# Patient Record
Sex: Male | Born: 1955 | Race: Black or African American | Hispanic: No | Smoking: Current every day smoker
Health system: Southern US, Community
[De-identification: ages and names within clinical notes are randomized; demographics above are authoritative.]

## PROBLEM LIST (undated history)

## (undated) DIAGNOSIS — I82409 Acute embolism and thrombosis of unspecified deep veins of unspecified lower extremity: Secondary | ICD-10-CM

## (undated) DIAGNOSIS — C801 Malignant (primary) neoplasm, unspecified: Secondary | ICD-10-CM

## (undated) DIAGNOSIS — I219 Acute myocardial infarction, unspecified: Secondary | ICD-10-CM

## (undated) DIAGNOSIS — E785 Hyperlipidemia, unspecified: Secondary | ICD-10-CM

## (undated) DIAGNOSIS — E119 Type 2 diabetes mellitus without complications: Secondary | ICD-10-CM

## (undated) DIAGNOSIS — C3491 Malignant neoplasm of unspecified part of right bronchus or lung: Principal | ICD-10-CM

## (undated) DIAGNOSIS — R202 Paresthesia of skin: Secondary | ICD-10-CM

## (undated) DIAGNOSIS — R531 Weakness: Secondary | ICD-10-CM

## (undated) DIAGNOSIS — C189 Malignant neoplasm of colon, unspecified: Secondary | ICD-10-CM

## (undated) DIAGNOSIS — M1990 Unspecified osteoarthritis, unspecified site: Secondary | ICD-10-CM

## (undated) DIAGNOSIS — I10 Essential (primary) hypertension: Secondary | ICD-10-CM

## (undated) DIAGNOSIS — C61 Malignant neoplasm of prostate: Secondary | ICD-10-CM

## (undated) DIAGNOSIS — R351 Nocturia: Secondary | ICD-10-CM

## (undated) DIAGNOSIS — E039 Hypothyroidism, unspecified: Secondary | ICD-10-CM

## (undated) DIAGNOSIS — R569 Unspecified convulsions: Secondary | ICD-10-CM

## (undated) DIAGNOSIS — I2510 Atherosclerotic heart disease of native coronary artery without angina pectoris: Secondary | ICD-10-CM

## (undated) DIAGNOSIS — R0600 Dyspnea, unspecified: Secondary | ICD-10-CM

## (undated) DIAGNOSIS — C419 Malignant neoplasm of bone and articular cartilage, unspecified: Secondary | ICD-10-CM

## (undated) HISTORY — PX: HEMICOLECTOMY: SHX854

## (undated) HISTORY — PX: OTHER SURGICAL HISTORY: SHX169

## (undated) HISTORY — DX: Atherosclerotic heart disease of native coronary artery without angina pectoris: I25.10

## (undated) HISTORY — DX: Hyperlipidemia, unspecified: E78.5

## (undated) HISTORY — DX: Malignant neoplasm of colon, unspecified: C18.9

## (undated) HISTORY — DX: Essential (primary) hypertension: I10

## (undated) HISTORY — DX: Malignant neoplasm of unspecified part of right bronchus or lung: C34.91

---

## 2001-03-30 HISTORY — PX: LEG SURGERY: SHX1003

## 2001-03-30 HISTORY — PX: OTHER SURGICAL HISTORY: SHX169

## 2005-03-30 DIAGNOSIS — I219 Acute myocardial infarction, unspecified: Secondary | ICD-10-CM

## 2005-03-30 HISTORY — PX: CORONARY ANGIOPLASTY: SHX604

## 2005-03-30 HISTORY — DX: Acute myocardial infarction, unspecified: I21.9

## 2005-03-30 HISTORY — PX: OTHER SURGICAL HISTORY: SHX169

## 2005-06-14 ENCOUNTER — Ambulatory Visit: Payer: Self-pay | Admitting: Cardiology

## 2005-06-14 ENCOUNTER — Inpatient Hospital Stay (HOSPITAL_COMMUNITY): Admission: AD | Admit: 2005-06-14 | Discharge: 2005-06-16 | Payer: Self-pay | Admitting: Internal Medicine

## 2005-06-30 ENCOUNTER — Ambulatory Visit: Payer: Self-pay | Admitting: Cardiology

## 2006-07-01 ENCOUNTER — Ambulatory Visit: Payer: Self-pay | Admitting: Physician Assistant

## 2006-07-16 ENCOUNTER — Ambulatory Visit: Payer: Self-pay | Admitting: Internal Medicine

## 2006-07-26 ENCOUNTER — Ambulatory Visit (HOSPITAL_COMMUNITY): Admission: RE | Admit: 2006-07-26 | Discharge: 2006-07-26 | Payer: Self-pay | Admitting: Internal Medicine

## 2006-07-26 ENCOUNTER — Ambulatory Visit: Payer: Self-pay | Admitting: Internal Medicine

## 2006-07-26 ENCOUNTER — Encounter (INDEPENDENT_AMBULATORY_CARE_PROVIDER_SITE_OTHER): Payer: Self-pay | Admitting: Specialist

## 2006-07-30 ENCOUNTER — Ambulatory Visit (HOSPITAL_COMMUNITY): Admission: RE | Admit: 2006-07-30 | Discharge: 2006-07-30 | Payer: Self-pay | Admitting: Internal Medicine

## 2006-08-24 ENCOUNTER — Encounter (INDEPENDENT_AMBULATORY_CARE_PROVIDER_SITE_OTHER): Payer: Self-pay | Admitting: General Surgery

## 2006-08-24 ENCOUNTER — Inpatient Hospital Stay (HOSPITAL_COMMUNITY): Admission: RE | Admit: 2006-08-24 | Discharge: 2006-08-31 | Payer: Self-pay | Admitting: General Surgery

## 2006-09-22 ENCOUNTER — Ambulatory Visit (HOSPITAL_COMMUNITY): Admission: RE | Admit: 2006-09-22 | Discharge: 2006-09-22 | Payer: Self-pay | Admitting: Internal Medicine

## 2006-11-08 ENCOUNTER — Encounter: Payer: Self-pay | Admitting: Cardiology

## 2006-11-08 ENCOUNTER — Ambulatory Visit: Payer: Self-pay | Admitting: Physician Assistant

## 2007-04-04 ENCOUNTER — Encounter: Payer: Self-pay | Admitting: Cardiology

## 2007-05-02 ENCOUNTER — Ambulatory Visit (HOSPITAL_COMMUNITY): Admission: RE | Admit: 2007-05-02 | Discharge: 2007-05-02 | Payer: Self-pay | Admitting: Internal Medicine

## 2007-05-04 ENCOUNTER — Encounter: Payer: Self-pay | Admitting: Cardiology

## 2007-07-12 ENCOUNTER — Ambulatory Visit: Payer: Self-pay | Admitting: Cardiology

## 2007-07-21 ENCOUNTER — Encounter: Payer: Self-pay | Admitting: Physician Assistant

## 2007-08-31 ENCOUNTER — Encounter: Payer: Self-pay | Admitting: Cardiology

## 2007-09-06 ENCOUNTER — Ambulatory Visit: Payer: Self-pay | Admitting: Internal Medicine

## 2007-09-06 ENCOUNTER — Ambulatory Visit (HOSPITAL_COMMUNITY): Admission: RE | Admit: 2007-09-06 | Discharge: 2007-09-06 | Payer: Self-pay | Admitting: Internal Medicine

## 2008-01-10 ENCOUNTER — Encounter: Payer: Self-pay | Admitting: Cardiology

## 2008-03-19 ENCOUNTER — Encounter: Payer: Self-pay | Admitting: Cardiology

## 2008-03-20 ENCOUNTER — Encounter: Payer: Self-pay | Admitting: Cardiology

## 2008-04-17 ENCOUNTER — Ambulatory Visit: Payer: Self-pay | Admitting: Cardiology

## 2008-11-21 DIAGNOSIS — I10 Essential (primary) hypertension: Secondary | ICD-10-CM

## 2008-11-21 DIAGNOSIS — I2510 Atherosclerotic heart disease of native coronary artery without angina pectoris: Secondary | ICD-10-CM

## 2008-11-21 DIAGNOSIS — E785 Hyperlipidemia, unspecified: Secondary | ICD-10-CM | POA: Insufficient documentation

## 2008-11-22 ENCOUNTER — Encounter (INDEPENDENT_AMBULATORY_CARE_PROVIDER_SITE_OTHER): Payer: Self-pay | Admitting: *Deleted

## 2009-02-27 ENCOUNTER — Encounter: Payer: Self-pay | Admitting: Cardiology

## 2009-03-30 DIAGNOSIS — C801 Malignant (primary) neoplasm, unspecified: Secondary | ICD-10-CM

## 2009-03-30 HISTORY — DX: Malignant (primary) neoplasm, unspecified: C80.1

## 2009-05-01 ENCOUNTER — Encounter: Payer: Self-pay | Admitting: Cardiology

## 2009-05-15 ENCOUNTER — Ambulatory Visit: Payer: Self-pay | Admitting: Cardiology

## 2009-05-15 DIAGNOSIS — C189 Malignant neoplasm of colon, unspecified: Secondary | ICD-10-CM

## 2009-10-29 ENCOUNTER — Encounter: Payer: Self-pay | Admitting: Cardiology

## 2009-10-29 ENCOUNTER — Encounter (INDEPENDENT_AMBULATORY_CARE_PROVIDER_SITE_OTHER): Payer: Self-pay | Admitting: *Deleted

## 2009-11-12 ENCOUNTER — Ambulatory Visit: Payer: Self-pay | Admitting: Cardiology

## 2009-11-20 ENCOUNTER — Encounter (INDEPENDENT_AMBULATORY_CARE_PROVIDER_SITE_OTHER): Payer: Self-pay | Admitting: *Deleted

## 2010-01-21 ENCOUNTER — Ambulatory Visit: Payer: Self-pay | Admitting: Cardiology

## 2010-01-29 ENCOUNTER — Encounter (INDEPENDENT_AMBULATORY_CARE_PROVIDER_SITE_OTHER): Payer: Self-pay | Admitting: *Deleted

## 2010-03-12 ENCOUNTER — Encounter: Payer: Self-pay | Admitting: Internal Medicine
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
## 2010-04-29 NOTE — Letter (Signed)
Summary: Engineer, materials at [facility]  [address]   [place], [place] [address]   Phone: [phone]  Fax: [phone]        November 20, 2009 MRN: [id number]   [name] [address] [place], [place]  [address]   Dear Mr. CU,  Your test ordered by Selena Batten has been reviewed by your physician (or physician assistant) and was found to be normal or stable. Your physician (or physician assistant) felt no changes were needed at this time.  ____ Echocardiogram  __X_ Cardiac Stress Test  ____ Lab Work  ____ Peripheral vascular study of arms, legs or neck  ____ CT scan or X-ray  ____ Lung or Breathing test  ____ Other:   Thank you.   Hoover Brunette, LPN    Duane Boston, M.D., F.A.C.C. Thressa Sheller, M.D., F.A.C.C. Oneal Grout, M.D., F.A.C.C. Cheree Ditto, M.D., F.A.C.C. Daiva Nakayama, M.D., F.A.C.C. Kenney Houseman, M.D., F.A.C.C. Jeanne Ivan, PA-C

## 2010-04-29 NOTE — Miscellaneous (Signed)
Summary: Orders Update  Clinical Lists Changes  Orders: Added new Referral order of Nuclear Med (Nuc Med) - Signed 
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
## 2010-04-29 NOTE — Letter (Signed)
Summary: Lexiscan or Dobutamine Pharmacist, community at [facility]  [address]   [place], [place] [address]   Phone: [phone]  Fax: [phone]      [facility] Cardiovascular Services  Lexiscan or Dobutamine Cardiolite Strss Test    [facility]  Appointment Date:_  Appointment Time:_  Your doctor has ordered a CARDIOLITE STRESS TEST using a medication to stimulate exercise so that you will not have to walk on the treadmill to determine the condition of your heart during stress. If you take blood pressure medication, ask your doctor if you should take it the day of your test. You should not have anything to eat or drink at least 4 hours before your test is scheduled, and no caffeine, including decaffeinated tea and coffee, chocolate, and soft drinks for 24 hours before your test.  You will need to register at the Outpatient/Main Entrance at the hospital 15 minutes before your appointment time. It is a good idea to bring a copy of your order with you. They will direct you to the Diagnostic Imaging (Radiology) Department.  You will be asked to undress from the waist up and given a hospital gown to wear, so dress comfortably from the waist down for example: Sweat pants, shorts, or skirt Rubber soled lace up shoes (tennis shoes)  Plan on about three hours from registration to release from the hospital

## 2010-04-29 NOTE — Letter (Signed)
Summary: Engineer, materials at [facility]  [address]   [place], [place] [address]   Phone: [phone]  Fax: [phone]        January 29, 2010 MRN: [id number]   Cameron Chandler [address] [place], [place]  [address]   Dear [name],  Your test ordered by Selena Batten has been reviewed by your physician (or physician assistant) and was found to be normal or stable. Your physician (or physician assistant) felt no changes were needed at this time.  ____ Echocardiogram  ____ Cardiac Stress Test  __X__ Lab Work - tumor marker for colon cancer was negative  ____ Peripheral vascular study of arms, legs or neck  ____ CT scan or X-ray  ____ Lung or Breathing test  __X__ Other:  I was unable to reach you by the numbers we had available in your chart.    [phone] - out of service [phone] - had message that mailbox had not been set up yet   Thank you.   Hoover Brunette, LPN    Duane Boston, M.D., F.A.C.C. Thressa Sheller, M.D., F.A.C.C. Oneal Grout, M.D., F.A.C.C. Cheree Ditto, M.D., F.A.C.C. Daiva Nakayama, M.D., F.A.C.C. Kenney Houseman, M.D., F.A.C.C. Jeanne Ivan, PA-C

## 2010-05-01 NOTE — Letter (Signed)
Summary: CLINIC NOTE FROM DR DAROVSKY  CLINIC NOTE FROM DR DAROVSKY   Imported By: Rexene Alberts 03/12/2010 15:12:02  _____________________________________________________________________  External Attachment:    Type:   Image     Comment:   External Document

## 2010-08-01 ENCOUNTER — Other Ambulatory Visit: Payer: Self-pay | Admitting: *Deleted

## 2010-08-01 MED ORDER — LISINOPRIL 20 MG PO TABS: 20.0000 mg | ORAL_TABLET | Freq: Every day | ORAL | Status: DC

## 2010-08-01 MED ORDER — SIMVASTATIN 40 MG PO TABS: 40.0000 mg | ORAL_TABLET | Freq: Every evening | ORAL | Status: DC

## 2010-08-12 NOTE — Op Note (Signed)
NAMEKADE, DEMICCO             ACCOUNT NO.:  [id number]   MEDICAL RECORD NO.:  [id number]          PATIENT TYPE:  INP   LOCATION:  2899                         FACILITY:  [facility]   PHYSICIAN:  Boston Service, M.D.DATE OF BIRTH:  1955-10-11   DATE OF PROCEDURE:  08/24/2006  DATE OF DISCHARGE:                               OPERATIVE REPORT   GASTROENTEROLOGY:  Roetta Sessions, MD, [facility].   GENERAL SURGERY:  Angelia Mould. Derrell Lolling, M.D., [facility].   UROLOGY:  Boston Service, M.D., [facility].   PREOPERATIVE DIAGNOSIS:  A 54 year old male, cancer of the proximal  rectum.  Careful gastrointestinal evaluation with Dr. Jena Gauss.  Planned  surgery with Dr. Derrell Lolling.  Felt that ureteral stents prior to surgery  would be helpful in identifying the path of the ureters.  Happy to  provide that service.   POSTOPERATIVE DIAGNOSIS:  A 55 year old male, cancer of the proximal  rectum.  Careful gastrointestinal evaluation with Dr. Jena Gauss.  Planned  surgery with Dr. Derrell Lolling.  Felt that ureteral stents prior to surgery  would be helpful in identifying the path of the ureters.  Happy to  provide that service.   PROCEDURE:  1. Cystoscopy.  2. Retrogrades.  3. Right and left ureteral stent placement, 6-French x2.   ANESTHESIA:  General.   DESCRIPTION OF PROCEDURE:  Patient was prepped and draped in the  dorsolithotomy position after institution of an adequate level of  general anesthesia.  A well-lubricated 22-French panendoscope was gently  inserted at the urethral meatus, normal urethra and sphincter,  nonobstructive prostate.  Bladder was carefully inspected with the 12  and 70-degree lenses and showed no obvious evidence of intravesical  pathology.  Blocking catheter was positioned at the right orifice and  then at the left orifice.   Retrograde films were then obtained.  With gentle injection of 3-6 mL of  contrast, ureter, pelvis, and calyces were easily outlined.  Floppy-tip  guidewire was advanced into the upper pole calyces.  A 6-French end-hole  catheter was then advanced over the guidewire to a distance of about 20  cm.  Cystoscope was then gently withdrawn.  An 18-French Foley was  advanced into the bladder with immediate return of several hundred  milliliters of clear irrigant.  Ureteral catheters were then tied to the  Foley catheter, will remain in place during the procedure, to be removed  by Dr. Derrell Lolling at the end of the procedure.          ______________________________  Boston Service, M.D.    RH/MEDQ  D:  08/24/2006  T:  08/24/2006  Job:  [id number]
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
## 2010-08-12 NOTE — Op Note (Signed)
NAME:  Cameron Chandler, Cameron Chandler             ACCOUNT NO.:  [id number]   MEDICAL RECORD NO.:  [id number]          PATIENT TYPE:  AMB   LOCATION:  DAY                           FACILITY:  [facility]   PHYSICIAN:  R. Roetta Sessions, M.D. DATE OF BIRTH:  Feb 16, 1956   DATE OF PROCEDURE:  09/06/2007  DATE OF DISCHARGE:                               OPERATIVE REPORT   INDICATIONS FOR PROCEDURE:  A 55 year old gentleman was found have  rectal cancer and colonic adenomas.  A colonoscopy 1 year ago.  He is  status post low anterior resection by Dr. Claud Kelp.  He has been  followed by Dr. Earma Reading at [facility].  He has  received chemotherapy.  He is being followed closely.  He reports being  told he is doing very well at followup.  He has no lower GI tract  symptoms.  He is here for his 1 year surveillance, although he presents  a little late.  This approach has discussed with Mr. Casebeer.  Risks,  benefits, alternatives, and limitations have been reviewed, questions  answered, all parties agreeable.  All questions answered.   PROCEDURE NOTE:  O2 saturation, blood pressure, and pulse of the patient  monitored throughout the entire procedure.  Conscious sedation, Versed 3  mg IV, Demerol 75 mg IV in divided doses.   INSTRUMENT:  Pentax video chip system.   FINDINGS:  Digital rectal exam revealed no abnormalities.  Endoscopic  findings, prep was good.  The surgical anastomosis was identified at 10-  12 cm from the anal verge.  It looked good.  Examination of the residual  colonic colon was undertaken.  The scope was easily advanced through the  residual colon to the cecum.  Appendix and ileocecal valve were well  seen and photographed for the record.  Terminal ileum was intubated 10  cm from this level.  Scope was slowly cautiously withdrawn.  All  previous mentioned mucosal surfaces were again seen.  The patient had a  few scattered distal colonic diverticula.  The remainder of  colonic  mucosa appeared entirely normal.  The scope was pulled down into the  rectum where the anastomosis was again looked at further and rectum was  then carefully examined including retroflexed view of the distal rectum  revealed no mucosal abnormalities.  The patient tolerated the procedure  well and was reacted in Endoscopy.   IMPRESSION:  1. Surgical anastomosis 10-12 cm.  Residual rectum appeared normal.      Distal colonic diverticular and residual colonic mucosa appeared      normal.  Normal terminal ileum.  Diverticulosis literature provided      to Mr. Musselman.  2. Repeat surveillance colonoscopy in 3 years.      Jonathon Bellows, M.D.  Electronically Signed     RMR/MEDQ  D:  09/06/2007  T:  09/07/2007  Job:  [id number]   cc:   Lebron Conners. Darovsky, M.D.  Fax: [phone]   Learta Codding, MD,FACC  [address]  [place], [place] [address]   Angelia Mould. Derrell Lolling, M.D.  [address], [address]  [place]  [place] [address]   [employer] [facility], M.D.  Fax: [phone]   Kirstie Peri, MD  Fax: [phone]

## 2010-08-12 NOTE — Assessment & Plan Note (Signed)
[facility]                          [facility] NOTE   TAREK, CRAVENS                    MRN:          [id number]  DATE:07/12/2007                            DOB:          09/17/1955    PHYSICIANS:  Cardiologist Dr. Lewayne Bunting, oncologist Dr. Earma Reading.   HISTORY OF PRESENT ILLNESS:  Mr. Cameron Chandler is a 55 year old male patient  with a history of coronary artery disease and stage IV colon cancer who  presents to the office today for follow-up.  Overall he is doing well.  He has completed chemotherapy with FOLFOX 6 and Avastin.  He has to wait  another four months before he gets his Port-A-Cath out.  He denies any  chest pain, shortness of breath, syncope, near syncope, orthopnea, PND  or pedal edema.   MEDICATIONS:  1. Aspirin 325 mg daily.  2. Simvastatin 40 mg q.h.s.  3. Metoprolol 25 mg b.i.d.  4. Lisinopril 20 mg a day.  5. Nitroglycerin for p.r.n. chest pain.   ALLERGIES:  No known drug allergies.   PHYSICAL EXAMINATION:  GENERAL:  He is a well-nourished, well-developed  man.  VITAL SIGNS:  Blood pressure 112/70, pulse 62, respirations 16, weight  202 pounds.  HEENT:  Normal.  NECK:  Without JVD.  Carotids without bruits bilaterally.  CARDIAC:  Normal S1, S2, regular rate and rhythm.  LUNGS:  Clear to auscultation bilaterally.  ABDOMEN:  Nontender.  EXTREMITIES:  Without edema.  NEUROLOGIC:  He is alert and oriented x3.  Cranial nerves II-XII are  grossly intact.   IMPRESSION:  1. Coronary artery disease.      a.     Status post non-ST elevation myocardial function March 2007       treated with a bare metal stent of the proximal circumflex.  2. Good left ventricular function.  3. Sigmoid adenocarcinoma clinical stage 4.      a.     Chemotherapy with FOLFOX 6 and Avastin completed two months       ago.      b.     Followed by Dr. Ubaldo Glassing.  4. Hypertension controlled.  5. Treated dyslipidemia.  6.  Hyperglycemia.      a.     Lab work done in October 2009 revealing a glucose of 153.  7. Gastroesophageal reflux disease.  8. Tobacco abuse.   PLAN:  Mr. Casher presents to the office today for follow-up.  From a  cardiovascular standpoint he is doing well.  No medication changes will  be made today.  He needs follow-up on his lipids.  Will set him up for a  fasting CMET and lipid panel as well as a hemoglobin A1c to further  assess his hyperglycemia.  He does not have a primary care physician,  and we will refer him to one for follow up on his hyperglycemia and  possible diabetes mellitus.  We will see him back in six months for  routine follow-up or sooner p.r.n.  Of note, the components of FOLFOX  and the agent, avastin, do not appear to be cardiotoxic.  Tereso Newcomer, PA-C  Electronically Signed      Learta Codding, MD,FACC  Electronically Signed   SW/MedQ  DD: 07/12/2007  DT: 07/12/2007  Job #: [id number]   cc:   Lebron Conners. Darovsky, M.D.

## 2010-08-12 NOTE — Assessment & Plan Note (Signed)
[facility]                          [facility]   Cameron Chandler, Cameron Chandler                    MRN:          [id number]  DATE:04/17/2008                            DOB:          09-09-1955    REFERRING PHYSICIAN:  Boris M. Darovsky, M.D.   HISTORY OF PRESENT ILLNESS:  The patient is a 55 year old male with a  history of coronary artery disease with stage IV colon cancer.  He has  completed chemotherapy.  He is doing quite well.  He still has a Port-A-  Cath in place.  He also has a history of coronary artery disease and is  status post bare-metal stent for a non-ST elevation myocardial  infarction in 2007.  However, the patient is doing quite well.  He  reports no chest pressure, shortness of breath, orthopnea or PND.   MEDICATIONS:  1. Aspirin 325 daily.  2. Simvastatin 40 mg p.o. nightly.  3. Metoprolol 25 mg p.o. b.i.d.  4. Lisinopril 10 mg p.o. daily.   PHYSICAL EXAMINATION:  VITAL SIGNS:  Blood pressure 110/72, heart rate  60, and weight is 225 pounds.  NECK:  Normal carotid upstroke.  No carotid bruits.  LUNGS:  Clear breath sounds bilaterally.  HEART:  Regular rate and rhythm.  Normal sinus rhythm.  No murmurs,  rubs, or gallops.  ABDOMEN:  Soft and nontender.  No rebound or guarding.  Good bowel  sounds.  EXTREMITIES:  No cyanosis, clubbing, or edema.   PROBLEM LIST:  1. Coronary artery disease.      a.     Status post non-ST elevation myocardial infarction in March       2007 treated with a bare-metal stent to the proximal circumflex.      b.     No recurrent angina.  2. Good left ventricular function.  3. Sigmoid adenocarcinoma, clinical stage IV status post FOLFOX6      therapy and Avastin.  4. Hypertension.  5. Treated dyslipidemia.  6. Hyperglycemia.  7. Gastroesophageal reflux disease.  8. Tobacco use.   PLAN:  From cardiovascular standpoint, the patient is stable.  We  reviewed his EKG, there are no acute  ischemic changes.  The patient can  continue his medical therapy, but I did advise him to discontinue  tobacco.     Learta Codding, MD,FACC  Electronically Signed    GED/MedQ  DD: 04/17/2008  DT: 04/18/2008  Job #: [id number]   cc:   Lebron Conners. Darovsky, M.D.

## 2010-08-12 NOTE — Op Note (Signed)
NAMESYNCERE, EBLE             ACCOUNT NO.:  [id number]   MEDICAL RECORD NO.:  [id number]          PATIENT TYPE:  INP   LOCATION:  2899                         FACILITY:  [facility]   PHYSICIAN:  Angelia Mould. Derrell Lolling, M.D.DATE OF BIRTH:  12/06/1955   DATE OF PROCEDURE:  08/24/2006  DATE OF DISCHARGE:                               OPERATIVE REPORT   PREOPERATIVE DIAGNOSIS:  Carcinoma of the rectosigmoid junction.   POSTOPERATIVE DIAGNOSIS:  Carcinoma of the rectosigmoid junction.   OPERATION PERFORMED:  1. Insertion of bilateral ureteral stents (by Dr. [place] Service).  2. Low anterior resection with coloproctostomy.   SURGEON:  Angelia Mould. Derrell Lolling, M.D.   FIRST ASSISTANT:  Sharlet Salina T. Hoxworth, M.D.   OPERATIVE INDICATIONS:  This is a 55 year old black man with coronary  artery disease and a myocardial infarction in March 2007.  He has had  some low-volume rectal bleeding for several months but no abdominal pain  or weight loss or change in his bowel habits.  Dr. Gaetana Michaelis  performed colonoscopy on July 26, 2006 and he found a circumferential  apple-core lesion starting at 15 cm and going up to 20 cm.  He was able  to go all the way to the cecum.  He took out 2 or 3 other polyps which  were benign.  The biopsy of the rectal mass showed adenocarcinoma.  A CT  scan of the abdomen and pelvis was subsequently performed which looked  fine except for the tumor in the distal sigmoid to proximal rectum and  some suggestion of enlarged lymph nodes.  There was no free fluid or  ascites.  He was counseled as an outpatient.  He has undergone a bowel  prep.  He is brought to the operating room electively.   OPERATIVE FINDINGS:  The patient had a circumferential tumor at the  rectosigmoid junction.  This was several centimeters above the  peritoneal reflection.  There were some enlarged lymph nodes palpable up  near the origin of the inferior mesenteric artery and these were  resected  with the specimen.  When Dr. Johna Sheriff performed the proctoscopy  at the end of the case he said he saw a small hyperplastic polyp at  about 9 cm.  This will require followup.  The liver felt normal.  There  was no ascites.  There was no mass in the retroperitoneum or in the  omentum otherwise.  There was no sign of any obstruction.   OPERATIVE TECHNIQUE:  Following the induction of general endotracheal  anesthesia, the patient was identified as the correct patient and the  correct procedure.  Intravenous antibiotics were given prior to the  incisions.  The patient was placed in the dorsal lithotomy position.   Dr. [place] Service performed cystoscopy and insertion of bilateral  ureteral stents.  That will be dictated separately.  That was  uneventful.   We left the patient in dorsal lithotomy position with the yellow fin  rigid stirrups.  The abdomen and perineum were prepped and draped in a  sterile fashion.  A lower midline laparotomy was performed.  The abdomen  was  entered and explored with findings as described above.  The distal  sigmoid was mobilized by dividing its lateral peritoneal attachments and  mobilizing it to the midline.  I could palpate the left and the right  ureter with the stents in place.  I could palpate an enlarged lymph node  in the sigmoid mesentery just above the level of the sacral promontory.  We divided the mid sigmoid colon about 8 to 10 cm proximal to the tumor  using a GIA stapling device.  Mesenteric vessels were isolated, smaller  ones divided with the LigaSure device and larger ones divided between  clamps and tied off with 2-0 silk ties.  We took the dissection all the  way back up to near the origin of the inferior mesenteric artery to  include all the lymph nodes.  A paramedial rectal excision technique was  used, keeping the dissection in the avascular plane between the  presacral nerves and the mesorectum.  Harmonic scalpel was used to  divide  the lateral attachments and take the dissection down into the  pelvis.  Once we got about 5 cm distal to the tumor we began dividing  the mesorectum with the Harmonic scalpel and the LigaSure device.  Once  we felt like we had about a 5-cm margin and had the rectum completely  cleaned off, we placed a Contour stapling device with a green load  across the rectum, closed the stapler, held it in place for 20 seconds,  fired it and removed it.  The staple lines looked good.  We marked the  distal margin with a silk suture.  The specimen was sent to the lab.  Dr. Delila Spence looked at the specimen grossly and said we had excellent  margins and at least a 4-cm margin distally.   The proximal colon was examined in preparation for the anastomosis.  I  cleaned off a little bit of the pericolonic fat at the staple line.  Staple line was amputated.  The colon was healthy and pink and viable.  A pursestring suture of 2-0 Prolene was placed in the proximal colon.  Sizers were used.  I found that I could place a 29-mm sizer into the  colon.  A 29-mm EEA stapling device was brought to the operative field.  The anvil was removed, and the anvil was inserted into the proximal  colon and the pursestring suture tied down onto this.  This tied down  securely and snugly.   We irrigated out the pelvis.  Dr. Johna Sheriff went down to the perineum and  gently dilated the rectum.  He was able to dilate the rectum slowly and  then got a 29-mm EEA stapler up into the rectum and passed this up to  the staple line under direct vision.  Under direct vision he opened the  stapler and the spike came out exactly at the staple line.  We secured  the proximal colon anvil onto the stapler, made sure there was no  twisting and everything was oriented correctly, and then Dr. Johna Sheriff  slowly closed the stapler, fired it, opened and removed it.  Dr.  Johna Sheriff examined the contents of the stapler and found two complete doughnut rings.   He performed a proctoscopy.  He saw a small  hyperplastic polyp at 9 cm above the anal verge which will need to be  evaluated further colonoscopically.  He found the anastomosis at 12 to  13 cm.  The anastomosis was not bleeding and appeared  intact, and he  insufflated the colon below water and there was no leak or air bubbles  anywhere.  The air was removed.  We changed our instruments, gowns and  gloves.  We irrigated out the lower abdomen and pelvis.  After drying  everything up, it looked good.  We placed Tisseel around the  anastomosis.  There was no need to close the mesentery.  There was no  tension on the anastomosis.  Small bowel and omentum returned to their  anatomic positions.  The midline fascia was closed with a running,  double-stranded suture of #1 PDS.  The skin was irrigated with saline  and the skin closed with skin staples.  Clean bandages were placed.   At the end of the case the Foley catheter was left in place, but both  ureteral stents were removed by the nurse circulating.   The patient tolerated the procedure well and was taken to recovery room  in stable condition.   ESTIMATED BLOOD LOSS:  100 to 150 mL.   COMPLICATIONS:  None.   SPONGE, NEEDLE AND INSTRUMENT COUNTS:  Correct.      Angelia Mould. Derrell Lolling, M.D.  Electronically Signed     HMI/MEDQ  D:  08/24/2006  T:  08/24/2006  Job:  [id number]   cc:   Gaetana Michaelis, Dr.  Luis Abed, MD, [facility]  Kirstie Peri, MD

## 2010-08-15 NOTE — Cardiovascular Report (Signed)
NAMEJAMARRIUS, SALAY             ACCOUNT NO.:  [id number]   MEDICAL RECORD NO.:  [id number]          PATIENT TYPE:  INP   LOCATION:  2914                         FACILITY:  [facility]   PHYSICIAN:  Charlies Constable, M.D. [facility] DATE OF BIRTH:  01/05/1956   DATE OF PROCEDURE:  06/14/2005  DATE OF DISCHARGE:                              CARDIAC CATHETERIZATION   Cardiac catheterization and percutaneous coronary intervention.   CLINICAL HISTORY:  Mr. Brister is 55 years old and has no prior history of  known heart disease.  He has been having chest pain for two to three days,  which got worse today, and he went to [facility] where his EKG  showed diffuse ST-T changes, which were not diagnostic of an ST elevation  infarction.  His troponins returned positive and he was transferred here.  Because of continued pain, he was brought to the lab today emergently.   PROCEDURE:  The procedure was performed by the right femoral artery using  arterial sheath and 6 French preformed coronary catheters.  A femoral  arterial punch was performed and Omnipaque contrast was used.  After  completion of the diagnostic study, we made a decision to proceed with PCI  of the circumflex artery.   The patient had been given aspirin, heparin, and Integrilin at [facility].  He was enrolled in the CHAMPION trial and was randomized to  Plavix load versus CHAMPION study drug.  We used a CLS4 6-French guiding  catheter with side holes.  We crossed the lesion in the proximal circumflex  artery with a Prowater wire without difficulty.  We predilated with a 2.5 x  20 mm Maverick, performing one inflation up to 10 atmospheres for 30  seconds.  We then deployed a 3.0 x 18 mm Vision, deploying this with one  inflation of 18 atmospheres for 30 seconds.  We postdilated with a 3.5 x 50  mm Quantum Maverick performing one inflation up to 15 atmospheres for 30  seconds.  Final diagnostic studies were then performed  through the guiding  catheter.  The right femoral artery was closed with Angio-Seal at the end of  the procedure.  The patient tolerated the procedure well and left the  laboratory in satisfactory condition.   RESULTS:  The aortic pressure was 106/69 with a mean of 85 and left  ventricular pressure was 106/8.   The Left Main Coronary Artery:  The underlying left main coronary artery was  free of significant disease.   Left Anterior Descending Artery:  The left anterior descending artery gave  rise to two diagonal branches and three septal perforators.  There was TIMI-  2 flow in the LAD but no significant obstruction.   The Circumflex Artery:  The circumflex artery gave rise to an atrial branch,  a small marginal branch, a second marginal branch, and a posterolateral  branch.  There was 95% stenosis in the proximal circumflex artery with TIMI-  2 flow distally.   The Right Coronary Artery:  The right coronary artery was a dominant vessel  and gave rise to a conus branch, two right  ventricular branches, a posterior  descending, and a posterolateral branch.  This vessel was free of  significant disease, but there was TIMI-2 flow in the vessel.   Left Ventriculogram:  The left ventricular was performed in RAO projection  showed hypokinesis of the  inferobasal wall.  The overall wall motion was  good with an estimated ejection fraction of 60%.   Left Ventriculogram:  A left ventriculogram performed in the LAO projection  showed hypokinesis of the posterior wall.  The rest of the wall motion was  vigorous.   Following stenting of the circumflex lesion, this notes improvement from 95%  to 0% and the flow from TIMI-2 to TIMI-3 flow.  There appeared to be a small  amount of tissue prolapsed through the stent.   CONCLUSIONS:  1.  Non-ST-elevation myocardial infarction with persistent pain with 95%      stenosis in proximal circumflex artery, no significant obstruction and      TIMI-2  flow, no significant obstruction in the LAD and right coronary      arteries but with TIMI-2 flow in both vessels, inferior and posterior      wall hypokinesis.  2.  Successful stenting of the circumflex artery with improvement of central      narrowing from 95% to 0% and improvement in the flow from TIMI-2 to TIMI-      3 flow using a bare metal stent.   DISPOSITION:  Patient to return to postanesthesia for further observation.  Patient to remain on Plavix for 9-12 months for his acute coronary syndrome  and his stent.  Will talk at discharge on day two.           ______________________________  Charlies Constable, M.D. [facility]     BB/MEDQ  D:  06/14/2005  T:  06/15/2005  Job:  [id number]   cc:   Arvilla Meres, M.D. [facility]  [facility]  [address]  [place]  [place] [address]   Cardiopulmonary Lab
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
## 2010-08-15 NOTE — Op Note (Signed)
NAME:  Cameron Chandler, Cameron Chandler             ACCOUNT NO.:  [id number]   MEDICAL RECORD NO.:  [id number]          PATIENT TYPE:  AMB   LOCATION:  DAY                           FACILITY:  [facility]   PHYSICIAN:  R. Roetta Sessions, M.D. DATE OF BIRTH:  [birth year]   DATE OF PROCEDURE:  07/26/2006  DATE OF DISCHARGE:                               OPERATIVE REPORT   OPERATION/PROCEDURE:  Colonoscopy and snare polypectomy with biopsy.   INDICATIONS FOR PROCEDURE:  The patient is a 55 year old African  American male sent over by Dr. Lewayne Bunting in [place], [place] to  further evaluate a 1-year history of intermittent hematochezia.  Colonoscopy is now being done.  This approach has been discussed the  patient at length.  Potential risks, benefits and alternatives have been  reviewed.  There is no family history of colorectal neoplasia.  He has  never had his lower GI tract evaluated.  Please see documentation in the  medical record  for more information.   PROCEDURE NOTE:  Oxygen saturation, blood pressure, pulse, and  respirations monitored throughout the entire procedure.  Conscious  sedation with Versed 3 mg IV, Demerol 75 mg IV in divided doses.  Pentax  video chip system.   FINDINGS:  Digital rectal exam revealed no abnormalities.  The prep was  adequate.   Colon:  Examination of the proximal rectum revealed an apple core,  exophytic-appearing neoplastic process starting at 15 cm from anal  verge, extending 5 cm to approximately 20 cm from the anal verge.  Please see photos.  This producing significant encroachment on the  lumen.  It was very friable.  The rectal mucosa was inspected  thoroughly,  including retroflexion view of the anal verge.  There was a  5 mm polyp in the mid rectum which was cold snared but not recovered.  The scope was advanced across the neoplastic process with some  difficulty but on through the colon into the cecum.  The cecum,  ileocecal valve, appendiceal orifice were  well seen and photographed for  the record.  From this level scope was slowly and cautiously withdrawn.  All previously mentioned mucosal surfaces were again seen.  There was a  6 mm pedunculated polyp in the mid transverse colon which was cold  snared, and there was a similar size pedunculated polyp at the splenic  flexure which was also cold snared and recovered.  The patient was noted  have left-sided diverticula.  No other abnormalities were seen.  The  scope was pulled down across the apple core lesion and multiple biopsies  with gentle biopsy forceps were taken for histologic study.  The patient  tolerated the procedure well and was reactive after endoscopy.   IMPRESSION:  1. Rectal, transverse and splenic flexure polyps were removed with      cold snare technique as described above.  2. 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  Not      appreciable on digital rectal exam),  biopsied.  3. Left-sided diverticula.  Remainder of more proximal colon appeared      normal.   I called Dr. Andee Lineman to discuss my findings with him.  He was out today.  I related these  findings to [place], his nurse.   RECOMMENDATIONS:  1. Follow up on pathology  2. Pursue surgical consultation in the very near future.      Jonathon Bellows, M.D.  Electronically Signed     RMR/MEDQ  D:  07/26/2006  T:  07/27/2006  Job:  [id number]   cc:   Learta Codding, MD,FACC  [address]  [place], [place] [address]
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
## 2010-09-05 ENCOUNTER — Encounter: Payer: Self-pay | Admitting: Internal Medicine

## 2011-04-30 ENCOUNTER — Other Ambulatory Visit: Payer: Self-pay | Admitting: *Deleted

## 2011-04-30 MED ORDER — METOPROLOL TARTRATE 25 MG PO TABS: 25.0000 mg | ORAL_TABLET | Freq: Two times a day (BID) | ORAL | Status: DC

## 2011-04-30 MED ORDER — LISINOPRIL 20 MG PO TABS: 20.0000 mg | ORAL_TABLET | Freq: Every day | ORAL | Status: DC

## 2011-06-05 ENCOUNTER — Other Ambulatory Visit: Payer: Self-pay | Admitting: Cardiology

## 2011-07-30 ENCOUNTER — Other Ambulatory Visit: Payer: Self-pay | Admitting: Cardiology

## 2011-08-20 ENCOUNTER — Encounter: Payer: Medicare Other | Admitting: Internal Medicine

## 2011-08-20 DIAGNOSIS — K5730 Diverticulosis of large intestine without perforation or abscess without bleeding: Secondary | ICD-10-CM

## 2011-08-20 DIAGNOSIS — C189 Malignant neoplasm of colon, unspecified: Secondary | ICD-10-CM

## 2011-09-02 ENCOUNTER — Other Ambulatory Visit: Payer: Self-pay | Admitting: Cardiology

## 2011-10-02 ENCOUNTER — Other Ambulatory Visit: Payer: Self-pay | Admitting: Cardiology

## 2011-10-30 ENCOUNTER — Other Ambulatory Visit: Payer: Self-pay | Admitting: Cardiology

## 2011-11-02 ENCOUNTER — Encounter: Payer: Self-pay | Admitting: Cardiology

## 2011-11-02 ENCOUNTER — Ambulatory Visit (INDEPENDENT_AMBULATORY_CARE_PROVIDER_SITE_OTHER): Payer: Medicare Other | Admitting: Cardiology

## 2011-11-02 VITALS — BP 125/79 | HR 72 | Ht 66.0 in | Wt 209.1 lb

## 2011-11-02 DIAGNOSIS — Z79899 Other long term (current) drug therapy: Secondary | ICD-10-CM

## 2011-11-02 DIAGNOSIS — E785 Hyperlipidemia, unspecified: Secondary | ICD-10-CM

## 2011-11-02 DIAGNOSIS — I2510 Atherosclerotic heart disease of native coronary artery without angina pectoris: Secondary | ICD-10-CM

## 2011-11-02 MED ORDER — NITROGLYCERIN 0.4 MG SL SUBL: 0.4000 mg | SUBLINGUAL_TABLET | SUBLINGUAL | Status: DC | PRN

## 2011-11-02 MED ORDER — LISINOPRIL 20 MG PO TABS: 20.0000 mg | ORAL_TABLET | Freq: Every day | ORAL | Status: DC

## 2011-11-02 MED ORDER — METOPROLOL TARTRATE 25 MG PO TABS: 25.0000 mg | ORAL_TABLET | Freq: Two times a day (BID) | ORAL | Status: DC

## 2011-11-02 MED ORDER — SIMVASTATIN 40 MG PO TABS: 40.0000 mg | ORAL_TABLET | Freq: Every day | ORAL | Status: DC
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
## 2011-11-02 NOTE — Patient Instructions (Signed)
Continue all current medications. Refills sent to pharmacy on cardiac medications Labs:  Fasting lipid & liver panel Reminder:  Nothing to eat or drink after 12 midnight prior to labs. Office will contact with results Your physician wants you to follow up in:  1 year.  You will receive a reminder letter in the mail one-two months in advance.  If you don't receive a letter, please call our office to schedule the follow up appointment

## 2011-11-13 ENCOUNTER — Telehealth: Payer: Self-pay | Admitting: *Deleted

## 2011-11-13 NOTE — Telephone Encounter (Signed)
Message copied by Eustace Moore on Fri Nov 13, 2011 10:35 AM ------      Message from: Rollene Rotunda      Created: Thu Nov 12, 2011  9:32 PM       Labs are OK.  Continue current therapy.

## 2011-11-13 NOTE — Telephone Encounter (Signed)
Patient informed. 

## 2012-08-08 ENCOUNTER — Other Ambulatory Visit: Payer: Self-pay | Admitting: Cardiology

## 2012-08-18 ENCOUNTER — Encounter: Payer: Medicare Other | Admitting: Internal Medicine

## 2012-08-18 DIAGNOSIS — Z85038 Personal history of other malignant neoplasm of large intestine: Secondary | ICD-10-CM

## 2012-10-15 ENCOUNTER — Encounter: Payer: Self-pay | Admitting: Cardiology

## 2012-10-15 DIAGNOSIS — R42 Dizziness and giddiness: Secondary | ICD-10-CM

## 2012-11-01 ENCOUNTER — Ambulatory Visit: Payer: Medicare Other | Admitting: Cardiovascular Disease

## 2012-11-10 ENCOUNTER — Ambulatory Visit (INDEPENDENT_AMBULATORY_CARE_PROVIDER_SITE_OTHER): Payer: Medicare Other | Admitting: Cardiology

## 2012-11-10 ENCOUNTER — Encounter: Payer: Self-pay | Admitting: Cardiology

## 2012-11-10 VITALS — BP 118/81 | HR 70 | Ht 66.0 in | Wt 217.0 lb

## 2012-11-10 DIAGNOSIS — I2510 Atherosclerotic heart disease of native coronary artery without angina pectoris: Secondary | ICD-10-CM

## 2012-11-10 NOTE — Patient Instructions (Addendum)
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
## 2012-11-23 ENCOUNTER — Other Ambulatory Visit: Payer: Self-pay | Admitting: Cardiology

## 2012-12-13 DIAGNOSIS — C61 Malignant neoplasm of prostate: Secondary | ICD-10-CM

## 2012-12-13 HISTORY — PX: PROSTATE BIOPSY: SHX241

## 2012-12-13 HISTORY — DX: Malignant neoplasm of prostate: C61

## 2012-12-29 ENCOUNTER — Other Ambulatory Visit: Payer: Self-pay | Admitting: Cardiology

## 2013-01-28 ENCOUNTER — Other Ambulatory Visit: Payer: Self-pay | Admitting: Cardiology

## 2013-06-20 ENCOUNTER — Other Ambulatory Visit: Payer: Self-pay | Admitting: Cardiology

## 2013-11-10 ENCOUNTER — Ambulatory Visit (INDEPENDENT_AMBULATORY_CARE_PROVIDER_SITE_OTHER): Payer: Medicare HMO | Admitting: Cardiovascular Disease

## 2013-11-10 ENCOUNTER — Encounter: Payer: Self-pay | Admitting: Cardiovascular Disease

## 2013-11-10 VITALS — BP 135/91 | HR 68 | Ht 66.0 in | Wt 209.0 lb

## 2013-11-10 DIAGNOSIS — I2510 Atherosclerotic heart disease of native coronary artery without angina pectoris: Secondary | ICD-10-CM

## 2013-11-10 DIAGNOSIS — Z716 Tobacco abuse counseling: Secondary | ICD-10-CM

## 2013-11-10 DIAGNOSIS — Z7189 Other specified counseling: Secondary | ICD-10-CM

## 2013-11-10 DIAGNOSIS — E1165 Type 2 diabetes mellitus with hyperglycemia: Secondary | ICD-10-CM

## 2013-11-10 DIAGNOSIS — I10 Essential (primary) hypertension: Secondary | ICD-10-CM

## 2013-11-10 DIAGNOSIS — F17200 Nicotine dependence, unspecified, uncomplicated: Secondary | ICD-10-CM

## 2013-11-10 DIAGNOSIS — E785 Hyperlipidemia, unspecified: Secondary | ICD-10-CM

## 2013-11-10 DIAGNOSIS — E119 Type 2 diabetes mellitus without complications: Secondary | ICD-10-CM

## 2013-11-10 NOTE — Patient Instructions (Addendum)
Your physician recommends that you schedule a follow-up appointment in: 1 year. You will receive a reminder letter in the mail in about 10 months reminding you to call and schedule your appointment. If you don't receive this letter, please contact our office. Your physician recommends that you continue on your current medications as directed. Please refer to the Current Medication list given to you today. Labs requested from June 2015 from Dr. Joselyn Arrow office which included a lipid panel.
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
## 2013-11-13 ENCOUNTER — Telehealth: Payer: Self-pay | Admitting: *Deleted

## 2013-11-13 DIAGNOSIS — E785 Hyperlipidemia, unspecified: Secondary | ICD-10-CM

## 2013-11-13 DIAGNOSIS — Z79899 Other long term (current) drug therapy: Secondary | ICD-10-CM

## 2013-11-13 MED ORDER — SIMVASTATIN 80 MG PO TABS: 80.0000 mg | ORAL_TABLET | Freq: Every day | ORAL | Status: DC

## 2013-11-13 NOTE — Telephone Encounter (Signed)
Per MD, patient is to increase simvastatin to 80 mg and repeat FLP & LFT's in 3 months. Lab orders mailed to patient. He is unsure if he will get them done at PCP office, or [facility] lab.

## 2013-11-15 ENCOUNTER — Other Ambulatory Visit (HOSPITAL_COMMUNITY): Payer: Self-pay | Admitting: Urology

## 2013-11-15 DIAGNOSIS — C61 Malignant neoplasm of prostate: Secondary | ICD-10-CM

## 2013-11-15 DIAGNOSIS — R972 Elevated prostate specific antigen [PSA]: Secondary | ICD-10-CM

## 2013-11-27 ENCOUNTER — Ambulatory Visit (HOSPITAL_COMMUNITY)
Admission: RE | Admit: 2013-11-27 | Discharge: 2013-11-27 | Disposition: A | Discharge status: Home / Self Care | Payer: Medicare HMO | Source: Ambulatory Visit | Attending: Urology | Admitting: Urology

## 2013-11-27 DIAGNOSIS — C61 Malignant neoplasm of prostate: Secondary | ICD-10-CM | POA: Insufficient documentation

## 2013-11-27 DIAGNOSIS — R972 Elevated prostate specific antigen [PSA]: Secondary | ICD-10-CM

## 2013-11-27 LAB — POCT I-STAT CREATININE: CREATININE: 1.1 mg/dL (ref 0.50–1.35)

## 2013-11-27 MED ORDER — GADOBENATE DIMEGLUMINE 529 MG/ML IV SOLN
20.0000 mL | Freq: Once | INTRAVENOUS | Status: AC | PRN
  Administered 2013-11-27: 20 mL via INTRAVENOUS

## 2013-11-30 HISTORY — PX: PROSTATE BIOPSY: SHX241

## 2013-12-07 ENCOUNTER — Other Ambulatory Visit (HOSPITAL_COMMUNITY): Payer: Self-pay | Admitting: Urology

## 2013-12-07 DIAGNOSIS — C61 Malignant neoplasm of prostate: Secondary | ICD-10-CM

## 2013-12-12 ENCOUNTER — Telehealth: Payer: Self-pay | Admitting: *Deleted

## 2013-12-12 NOTE — Telephone Encounter (Signed)
Called patient to introduce myself as Prostate Oncology Navigator and coordinator of the [facility] [place], and to: 1)  confirm his referral for the clinic on 12/19/13 with an arrival time of 12:30,  2)  explain format of clinic,  3)  confirm his understanding of [place] location, 4)  explain arrival and registration procedure, and   5)  indicate I am mailing an [place] to him that contains medical information forms which he should complete and bring with him on the day of clinic.   I provided my phone number and encouraged him to call me if he has any questions after receiving the Information Packet or prior to my call the day before clinic.  He verbalized understanding of information provided.  Gayleen Orem, RN, BSN, [place] at [place] [phone]

## 2013-12-14 ENCOUNTER — Other Ambulatory Visit (HOSPITAL_COMMUNITY): Payer: Self-pay | Admitting: Urology

## 2013-12-14 ENCOUNTER — Ambulatory Visit (HOSPITAL_COMMUNITY)
Admission: RE | Admit: 2013-12-14 | Discharge: 2013-12-14 | Disposition: A | Discharge status: Home / Self Care | Payer: Medicare HMO | Source: Ambulatory Visit | Attending: Urology | Admitting: Urology

## 2013-12-14 ENCOUNTER — Encounter (HOSPITAL_COMMUNITY)
Admission: RE | Admit: 2013-12-14 | Discharge: 2013-12-14 | Disposition: A | Discharge status: Home / Self Care | Payer: Medicare HMO | Source: Ambulatory Visit | Attending: Urology | Admitting: Urology

## 2013-12-14 DIAGNOSIS — M25559 Pain in unspecified hip: Secondary | ICD-10-CM | POA: Diagnosis not present

## 2013-12-14 DIAGNOSIS — C61 Malignant neoplasm of prostate: Secondary | ICD-10-CM

## 2013-12-14 MED ORDER — TECHNETIUM TC 99M MEDRONATE IV KIT
26.1000 | PACK | Freq: Once | INTRAVENOUS | Status: AC | PRN
  Administered 2013-12-14: 26.1 via INTRAVENOUS

## 2013-12-15 ENCOUNTER — Encounter: Payer: Self-pay | Admitting: Radiation Oncology

## 2013-12-15 NOTE — Progress Notes (Signed)
GU Location of Tumor / Histology: prostate adenocarcinoma  If Prostate Cancer, Gleason Score is (4 + 4) and PSA is (6.3)  Cameron Chandler presented in 2014 with signs/symptoms of: elevated PSA 4.8, had biopsy, pt elected active surveillance. PSA increasing since 2014 to 6.3.  Biopsies of prostate revealed:  11/30/13 Volume 27.4 cc   Past/Anticipated interventions by urology, if any:   Past/Anticipated interventions by medical oncology, if any: will be seen in [facility] on 12/19/13  Weight changes, if any:   Bowel/Bladder complaints, if any:  IPSS 1  Nausea/Vomiting, if any: no  Pain issues, if any:  Chronic pain w/arthritis, leg cramping  SAFETY ISSUES:  Prior radiation? no  Pacemaker/ICD? no  Possible current pregnancy? na  Is the patient on methotrexate? no  Current Complaints / other details:  Married, disabled, 5 children No family history of prostate cancer.

## 2013-12-18 ENCOUNTER — Telehealth: Payer: Self-pay | Admitting: *Deleted

## 2013-12-18 NOTE — Telephone Encounter (Signed)
Called patient to see if he had any questions prior to his attendance at tomorrow's [facility] [place].  He stated he did not.  He confirmed he would bring the completed health information forms he received in the mailing I sent, confirmed his understanding of a 12:30 arrival time, confirmed his understanding of the [facility] location.  Gayleen Orem, RN, BSN, [place] at [place] [phone]

## 2013-12-19 ENCOUNTER — Encounter: Payer: Self-pay | Admitting: Radiation Oncology

## 2013-12-19 ENCOUNTER — Encounter: Payer: Self-pay | Admitting: *Deleted

## 2013-12-19 ENCOUNTER — Ambulatory Visit
Admission: RE | Admit: 2013-12-19 | Discharge: 2013-12-19 | Disposition: A | Discharge status: Home / Self Care | Payer: Medicare HMO | Source: Ambulatory Visit | Attending: Radiation Oncology | Admitting: Radiation Oncology

## 2013-12-19 VITALS — BP 150/66 | HR 72 | Temp 98.1°F | Resp 20 | Ht 66.0 in | Wt 212.8 lb

## 2013-12-19 DIAGNOSIS — C61 Malignant neoplasm of prostate: Secondary | ICD-10-CM | POA: Insufficient documentation

## 2013-12-19 HISTORY — DX: Malignant neoplasm of prostate: C61

## 2013-12-19 HISTORY — DX: Unspecified osteoarthritis, unspecified site: M19.90

## 2013-12-19 HISTORY — DX: Type 2 diabetes mellitus without complications: E11.9

## 2013-12-19 HISTORY — DX: Acute myocardial infarction, unspecified: I21.9
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
## 2013-12-20 ENCOUNTER — Encounter: Payer: Self-pay | Admitting: Specialist

## 2013-12-20 NOTE — Progress Notes (Signed)
Met with patient as part of Prostate MDC.  Reintroduced my role as his navigator and encouraged him to call as he proceeds with treatments and appointments at [facility].  Provided the accompanying Care Plan Summary:                                          Care Plan Summary  Name:   Mr. Cameron Chandler DOB:   30-May-1955  Your Medical Team:   Urologist -  Dr. Raynelle Bring, [facility]  Radiation Oncologist - Dr. Arloa Koh, [place]    Recommendations: 1) Androgen deprivation therapy for 1-2 yrs ([facility]) 2) Girtha Rm seed markers ([facility]) 3) Follow-up visit with Dr. Philis Pique in [place] 4) Start radiation therapy in 2-3 months in [place]. * These recommendations are based on information available as of today's consult.      Recommendations may change depending on the results of further tests or exams.  Next Steps: 1) You will be contacted by [facility] and [facility] with appointments.  Questions?  Please do not hesitate to call Gayleen Orem, RN, BSN, [facility] at [phone] with any questions or concerns.  Liliane Channel is Counsellor and is available to assist you while you're receiving your medical care at [facility].  I encouraged him to call me with any questions or concerns as his treatments progress.  He indicated understanding.  Gayleen Orem, RN, BSN, [place] [facility] [employer] [phone]

## 2013-12-20 NOTE — Progress Notes (Signed)
I met with Cameron Chandler in the Prostate [place]. He had four family members with him also. I provided him with information on available services and resources in the Patient and [facility], as well as my contact information. The patient identified moderate distress "7", and identified pain and adjustment to illness as needs. Oncologists provided information regarding treatment. Patient said he was relieved that he didn't need surgery. He reported that his distress was less by the end of the clinic.  Epifania Gore, PhD, [place]

## 2013-12-25 ENCOUNTER — Telehealth: Payer: Self-pay | Admitting: *Deleted

## 2013-12-25 NOTE — Telephone Encounter (Signed)
CALLED PATIENT TO INFORM OF 45 MIN. FU WITH DR. MANNING IN [place] ON 02-28-14 ARRIVAL TIME - 9:30 AM, SPOKE WITH PATIENT AND HE IS AWARE OF THIS APPT.

## 2014-05-04 NOTE — Addendum Note (Signed)
Encounter addended by: Deirdre Evener, RN on: 05/04/2014  9:39 AM<BR>     Documentation filed: Inpatient Document Flowsheet

## 2014-11-19 ENCOUNTER — Other Ambulatory Visit: Payer: Self-pay | Admitting: Cardiovascular Disease

## 2014-11-20 ENCOUNTER — Encounter: Payer: Self-pay | Admitting: Cardiovascular Disease

## 2014-11-20 ENCOUNTER — Ambulatory Visit (INDEPENDENT_AMBULATORY_CARE_PROVIDER_SITE_OTHER): Payer: Medicare HMO | Admitting: Cardiovascular Disease

## 2014-11-20 VITALS — BP 160/90 | HR 60 | Ht 66.0 in | Wt 210.0 lb

## 2014-11-20 DIAGNOSIS — I2510 Atherosclerotic heart disease of native coronary artery without angina pectoris: Secondary | ICD-10-CM | POA: Diagnosis not present

## 2014-11-20 DIAGNOSIS — I10 Essential (primary) hypertension: Secondary | ICD-10-CM

## 2014-11-20 DIAGNOSIS — E785 Hyperlipidemia, unspecified: Secondary | ICD-10-CM

## 2014-11-20 DIAGNOSIS — Z136 Encounter for screening for cardiovascular disorders: Secondary | ICD-10-CM

## 2014-11-20 DIAGNOSIS — Z716 Tobacco abuse counseling: Secondary | ICD-10-CM

## 2014-11-20 MED ORDER — METOPROLOL TARTRATE 25 MG PO TABS: 12.5000 mg | ORAL_TABLET | Freq: Two times a day (BID) | ORAL | Status: DC

## 2014-11-20 MED ORDER — ASPIRIN EC 81 MG PO TBEC: 81.0000 mg | DELAYED_RELEASE_TABLET | Freq: Every day | ORAL | Status: DC

## 2014-11-20 MED ORDER — BENAZEPRIL HCL 20 MG PO TABS: 20.0000 mg | ORAL_TABLET | Freq: Every day | ORAL | Status: DC

## 2014-11-20 NOTE — Patient Instructions (Signed)
Your physician wants you to follow-up in: 1 year with Dr. Bronson Ing. You will receive a reminder letter in the mail two months in advance. If you don't receive a letter, please call our office to schedule the follow-up appointment.  DECREASE ASPIRIN TO 81 MG DAILY  DECREASE METOPROLOL TO 12.5 MG TWO TIMES DAILY  INCREASE BENAZEPRIL TO 20 MG DAILY  WE WILL REQUEST LABS FROM YOUR PCP  [facility] for choosing [facility]!!!
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
## 2014-12-17 ENCOUNTER — Other Ambulatory Visit: Payer: Self-pay | Admitting: Cardiovascular Disease

## 2015-01-09 ENCOUNTER — Emergency Department (HOSPITAL_COMMUNITY)
Admission: EM | Admit: 2015-01-09 | Discharge: 2015-01-09 | Disposition: A | Discharge status: Home / Self Care | Payer: Medicare HMO | Attending: Emergency Medicine | Admitting: Emergency Medicine

## 2015-01-09 ENCOUNTER — Encounter (HOSPITAL_COMMUNITY): Payer: Self-pay | Admitting: Emergency Medicine

## 2015-01-09 DIAGNOSIS — Z791 Long term (current) use of non-steroidal anti-inflammatories (NSAID): Secondary | ICD-10-CM | POA: Diagnosis not present

## 2015-01-09 DIAGNOSIS — Z794 Long term (current) use of insulin: Secondary | ICD-10-CM | POA: Diagnosis not present

## 2015-01-09 DIAGNOSIS — E785 Hyperlipidemia, unspecified: Secondary | ICD-10-CM | POA: Insufficient documentation

## 2015-01-09 DIAGNOSIS — Z85038 Personal history of other malignant neoplasm of large intestine: Secondary | ICD-10-CM | POA: Insufficient documentation

## 2015-01-09 DIAGNOSIS — Z8546 Personal history of malignant neoplasm of prostate: Secondary | ICD-10-CM | POA: Diagnosis not present

## 2015-01-09 DIAGNOSIS — Z720 Tobacco use: Secondary | ICD-10-CM | POA: Insufficient documentation

## 2015-01-09 DIAGNOSIS — I10 Essential (primary) hypertension: Secondary | ICD-10-CM | POA: Insufficient documentation

## 2015-01-09 DIAGNOSIS — G6289 Other specified polyneuropathies: Secondary | ICD-10-CM | POA: Diagnosis not present

## 2015-01-09 DIAGNOSIS — R200 Anesthesia of skin: Secondary | ICD-10-CM | POA: Diagnosis present

## 2015-01-09 DIAGNOSIS — R319 Hematuria, unspecified: Secondary | ICD-10-CM | POA: Insufficient documentation

## 2015-01-09 DIAGNOSIS — M1990 Unspecified osteoarthritis, unspecified site: Secondary | ICD-10-CM | POA: Diagnosis not present

## 2015-01-09 DIAGNOSIS — E119 Type 2 diabetes mellitus without complications: Secondary | ICD-10-CM | POA: Diagnosis not present

## 2015-01-09 DIAGNOSIS — N289 Disorder of kidney and ureter, unspecified: Secondary | ICD-10-CM | POA: Diagnosis not present

## 2015-01-09 DIAGNOSIS — I2510 Atherosclerotic heart disease of native coronary artery without angina pectoris: Secondary | ICD-10-CM | POA: Diagnosis not present

## 2015-01-09 DIAGNOSIS — I252 Old myocardial infarction: Secondary | ICD-10-CM | POA: Insufficient documentation

## 2015-01-09 DIAGNOSIS — Z7982 Long term (current) use of aspirin: Secondary | ICD-10-CM | POA: Diagnosis not present

## 2015-01-09 DIAGNOSIS — Z79899 Other long term (current) drug therapy: Secondary | ICD-10-CM | POA: Insufficient documentation

## 2015-01-09 HISTORY — DX: Acute myocardial infarction, unspecified: I21.9

## 2015-01-09 LAB — CBC
HCT: 43.8 % (ref 39.0–52.0)
Hemoglobin: 15.2 g/dL (ref 13.0–17.0)
MCH: 30.2 pg (ref 26.0–34.0)
MCHC: 34.7 g/dL (ref 30.0–36.0)
MCV: 87.1 fL (ref 78.0–100.0)
Platelets: 284 10*3/uL (ref 150–400)
RBC: 5.03 MIL/uL (ref 4.22–5.81)
RDW: 14 % (ref 11.5–15.5)
WBC: 8.1 10*3/uL (ref 4.0–10.5)

## 2015-01-09 LAB — BASIC METABOLIC PANEL
Anion gap: 9 (ref 5–15)
BUN: 28 mg/dL — ABNORMAL HIGH (ref 6–20)
CHLORIDE: 105 mmol/L (ref 101–111)
CO2: 26 mmol/L (ref 22–32)
Calcium: 10.6 mg/dL — ABNORMAL HIGH (ref 8.9–10.3)
Creatinine, Ser: 1.57 mg/dL — ABNORMAL HIGH (ref 0.61–1.24)
GFR calc Af Amer: 53 mL/min — ABNORMAL LOW (ref >60)
GFR calc non Af Amer: 46 mL/min — ABNORMAL LOW (ref >60)
Glucose, Bld: 118 mg/dL — ABNORMAL HIGH (ref 65–99)
Potassium: 4.3 mmol/L (ref 3.5–5.1)
SODIUM: 140 mmol/L (ref 135–145)

## 2015-01-09 LAB — URINALYSIS, ROUTINE W REFLEX MICROSCOPIC
BILIRUBIN URINE: NEGATIVE
Glucose, UA: NEGATIVE mg/dL
Ketones, ur: NEGATIVE mg/dL
LEUKOCYTES UA: NEGATIVE
NITRITE: NEGATIVE
PH: 6 (ref 5.0–8.0)
Protein, ur: NEGATIVE mg/dL
SPECIFIC GRAVITY, URINE: 1.025 (ref 1.005–1.030)
Urobilinogen, UA: 0.2 mg/dL (ref 0.0–1.0)

## 2015-01-09 LAB — URINE MICROSCOPIC-ADD ON

## 2015-01-09 MED ORDER — SODIUM CHLORIDE 0.9 % IV BOLUS (SEPSIS)
1000.0000 mL | Freq: Once | INTRAVENOUS | Status: AC
  Administered 2015-01-09: 1000 mL via INTRAVENOUS

## 2015-01-09 MED ORDER — GABAPENTIN 100 MG PO CAPS: 100.0000 mg | ORAL_CAPSULE | Freq: Three times a day (TID) | ORAL | Status: DC

## 2015-01-09 NOTE — ED Notes (Signed)
Pt reports hand swelling bilaterally x2 months and "knees are collapsing" . Pt reports he has been having "dizzy spells" when standing.  Pt alert and oriented.

## 2015-01-09 NOTE — Discharge Instructions (Signed)
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
## 2015-01-09 NOTE — ED Notes (Addendum)
EKG shown to Dr. Rex Kras. No new orders given.

## 2015-01-10 LAB — URINE CULTURE: Culture: NO GROWTH

## 2015-01-25 ENCOUNTER — Emergency Department (HOSPITAL_COMMUNITY): Payer: Medicare HMO

## 2015-01-25 ENCOUNTER — Encounter (HOSPITAL_COMMUNITY): Payer: Self-pay | Admitting: Oncology

## 2015-01-25 ENCOUNTER — Observation Stay (HOSPITAL_COMMUNITY)
Admission: EM | Admit: 2015-01-25 | Discharge: 2015-01-30 | Disposition: A | Discharge status: Home / Self Care | Payer: Medicare HMO | Attending: Internal Medicine | Admitting: Internal Medicine

## 2015-01-25 DIAGNOSIS — I10 Essential (primary) hypertension: Secondary | ICD-10-CM | POA: Insufficient documentation

## 2015-01-25 DIAGNOSIS — I639 Cerebral infarction, unspecified: Secondary | ICD-10-CM

## 2015-01-25 DIAGNOSIS — Z7982 Long term (current) use of aspirin: Secondary | ICD-10-CM | POA: Insufficient documentation

## 2015-01-25 DIAGNOSIS — R29898 Other symptoms and signs involving the musculoskeletal system: Secondary | ICD-10-CM | POA: Diagnosis present

## 2015-01-25 DIAGNOSIS — Z79899 Other long term (current) drug therapy: Secondary | ICD-10-CM | POA: Insufficient documentation

## 2015-01-25 DIAGNOSIS — Z8546 Personal history of malignant neoplasm of prostate: Secondary | ICD-10-CM | POA: Insufficient documentation

## 2015-01-25 DIAGNOSIS — C61 Malignant neoplasm of prostate: Secondary | ICD-10-CM | POA: Diagnosis present

## 2015-01-25 DIAGNOSIS — M1388 Other specified arthritis, other site: Secondary | ICD-10-CM | POA: Insufficient documentation

## 2015-01-25 DIAGNOSIS — Z85038 Personal history of other malignant neoplasm of large intestine: Secondary | ICD-10-CM | POA: Insufficient documentation

## 2015-01-25 DIAGNOSIS — R531 Weakness: Secondary | ICD-10-CM | POA: Insufficient documentation

## 2015-01-25 DIAGNOSIS — M50323 Other cervical disc degeneration at C6-C7 level: Secondary | ICD-10-CM | POA: Insufficient documentation

## 2015-01-25 DIAGNOSIS — G959 Disease of spinal cord, unspecified: Secondary | ICD-10-CM | POA: Insufficient documentation

## 2015-01-25 DIAGNOSIS — C189 Malignant neoplasm of colon, unspecified: Secondary | ICD-10-CM | POA: Diagnosis present

## 2015-01-25 DIAGNOSIS — I252 Old myocardial infarction: Secondary | ICD-10-CM | POA: Insufficient documentation

## 2015-01-25 DIAGNOSIS — Z9049 Acquired absence of other specified parts of digestive tract: Secondary | ICD-10-CM | POA: Insufficient documentation

## 2015-01-25 DIAGNOSIS — G8250 Quadriplegia, unspecified: Secondary | ICD-10-CM

## 2015-01-25 DIAGNOSIS — F17210 Nicotine dependence, cigarettes, uncomplicated: Secondary | ICD-10-CM | POA: Insufficient documentation

## 2015-01-25 DIAGNOSIS — E119 Type 2 diabetes mellitus without complications: Secondary | ICD-10-CM

## 2015-01-25 DIAGNOSIS — M47812 Spondylosis without myelopathy or radiculopathy, cervical region: Secondary | ICD-10-CM | POA: Insufficient documentation

## 2015-01-25 DIAGNOSIS — I2510 Atherosclerotic heart disease of native coronary artery without angina pectoris: Secondary | ICD-10-CM | POA: Insufficient documentation

## 2015-01-25 DIAGNOSIS — R2681 Unsteadiness on feet: Principal | ICD-10-CM | POA: Insufficient documentation

## 2015-01-25 DIAGNOSIS — E1140 Type 2 diabetes mellitus with diabetic neuropathy, unspecified: Secondary | ICD-10-CM | POA: Insufficient documentation

## 2015-01-25 DIAGNOSIS — Z794 Long term (current) use of insulin: Secondary | ICD-10-CM | POA: Insufficient documentation

## 2015-01-25 DIAGNOSIS — E538 Deficiency of other specified B group vitamins: Secondary | ICD-10-CM | POA: Insufficient documentation

## 2015-01-25 DIAGNOSIS — N400 Enlarged prostate without lower urinary tract symptoms: Secondary | ICD-10-CM | POA: Insufficient documentation

## 2015-01-25 DIAGNOSIS — R159 Full incontinence of feces: Secondary | ICD-10-CM | POA: Insufficient documentation

## 2015-01-25 DIAGNOSIS — R319 Hematuria, unspecified: Secondary | ICD-10-CM | POA: Insufficient documentation

## 2015-01-25 DIAGNOSIS — E785 Hyperlipidemia, unspecified: Secondary | ICD-10-CM | POA: Insufficient documentation

## 2015-01-25 DIAGNOSIS — M4804 Spinal stenosis, thoracic region: Secondary | ICD-10-CM | POA: Insufficient documentation

## 2015-01-25 DIAGNOSIS — M4802 Spinal stenosis, cervical region: Secondary | ICD-10-CM | POA: Insufficient documentation

## 2015-01-25 LAB — I-STAT TROPONIN, ED: Troponin i, poc: 0 ng/mL (ref 0.00–0.08)

## 2015-01-25 MED ORDER — SODIUM CHLORIDE 0.9 % IV BOLUS (SEPSIS)
1000.0000 mL | Freq: Once | INTRAVENOUS | Status: AC
  Administered 2015-01-25: 1000 mL via INTRAVENOUS

## 2015-01-25 NOTE — ED Notes (Signed)
Per EMS pt has been experiencing weakness for, "some time."  Pt also told EMS he started a new medicine however unsure what that med is and pt has now lost control of bowel/bladder.  Pt w/ hx of colon CA now has prostate CA as well.

## 2015-01-25 NOTE — ED Notes (Signed)
The pt started gabapentin TID x one week ago.  Per pt that is when the loss of control of his bowels began.  Pt reports weakness has been going on for some time however feels it has gotten worse since beginning the medication.
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
## 2015-01-25 NOTE — ED Notes (Signed)
Bed: GE36 Expected date:  Expected time:  Means of arrival:  Comments: Hold cancer pt

## 2015-01-26 ENCOUNTER — Observation Stay (HOSPITAL_COMMUNITY): Payer: Medicare HMO

## 2015-01-26 ENCOUNTER — Emergency Department (HOSPITAL_COMMUNITY): Payer: Medicare HMO

## 2015-01-26 ENCOUNTER — Other Ambulatory Visit (HOSPITAL_COMMUNITY): Payer: Medicare HMO

## 2015-01-26 DIAGNOSIS — I639 Cerebral infarction, unspecified: Secondary | ICD-10-CM | POA: Insufficient documentation

## 2015-01-26 DIAGNOSIS — I2510 Atherosclerotic heart disease of native coronary artery without angina pectoris: Secondary | ICD-10-CM | POA: Diagnosis not present

## 2015-01-26 DIAGNOSIS — I10 Essential (primary) hypertension: Secondary | ICD-10-CM | POA: Diagnosis not present

## 2015-01-26 DIAGNOSIS — F17210 Nicotine dependence, cigarettes, uncomplicated: Secondary | ICD-10-CM | POA: Diagnosis present

## 2015-01-26 DIAGNOSIS — R2681 Unsteadiness on feet: Secondary | ICD-10-CM | POA: Diagnosis present

## 2015-01-26 DIAGNOSIS — E119 Type 2 diabetes mellitus without complications: Secondary | ICD-10-CM

## 2015-01-26 DIAGNOSIS — R29898 Other symptoms and signs involving the musculoskeletal system: Secondary | ICD-10-CM | POA: Diagnosis not present

## 2015-01-26 DIAGNOSIS — F17200 Nicotine dependence, unspecified, uncomplicated: Secondary | ICD-10-CM

## 2015-01-26 LAB — PROTIME-INR
INR: 0.97 (ref 0.00–1.49)
Prothrombin Time: 13.1 seconds (ref 11.6–15.2)

## 2015-01-26 LAB — LIPID PANEL
CHOL/HDL RATIO: 3.7 ratio
CHOLESTEROL: 112 mg/dL (ref 0–200)
HDL: 30 mg/dL — AB (ref >40)
LDL CALC: 47 mg/dL (ref 0–99)
TRIGLYCERIDES: 175 mg/dL — AB (ref <150)
VLDL: 35 mg/dL (ref 0–40)

## 2015-01-26 LAB — CBC WITH DIFFERENTIAL/PLATELET
BASOS ABS: 0 10*3/uL (ref 0.0–0.1)
Basophils Relative: 0 %
EOS ABS: 0.2 10*3/uL (ref 0.0–0.7)
Eosinophils Relative: 2 %
HEMATOCRIT: 40.7 % (ref 39.0–52.0)
HEMOGLOBIN: 14 g/dL (ref 13.0–17.0)
LYMPHS PCT: 26 %
Lymphs Abs: 2.3 10*3/uL (ref 0.7–4.0)
MCH: 29.5 pg (ref 26.0–34.0)
MCHC: 34.4 g/dL (ref 30.0–36.0)
MCV: 85.7 fL (ref 78.0–100.0)
MONO ABS: 1 10*3/uL (ref 0.1–1.0)
Monocytes Relative: 11 %
NEUTROS PCT: 61 %
Neutro Abs: 5.3 10*3/uL (ref 1.7–7.7)
PLATELETS: 281 10*3/uL (ref 150–400)
RBC: 4.75 MIL/uL (ref 4.22–5.81)
RDW: 13.9 % (ref 11.5–15.5)
WBC: 8.8 10*3/uL (ref 4.0–10.5)

## 2015-01-26 LAB — URINALYSIS, ROUTINE W REFLEX MICROSCOPIC
Bilirubin Urine: NEGATIVE
GLUCOSE, UA: NEGATIVE mg/dL
Hgb urine dipstick: NEGATIVE
Ketones, ur: NEGATIVE mg/dL
LEUKOCYTES UA: NEGATIVE
NITRITE: NEGATIVE
PH: 5.5 (ref 5.0–8.0)
PROTEIN: NEGATIVE mg/dL
Specific Gravity, Urine: 1.018 (ref 1.005–1.030)
Urobilinogen, UA: 0.2 mg/dL (ref 0.0–1.0)

## 2015-01-26 LAB — GLUCOSE, CAPILLARY
GLUCOSE-CAPILLARY: 96 mg/dL (ref 65–99)
Glucose-Capillary: 138 mg/dL — ABNORMAL HIGH (ref 65–99)
Glucose-Capillary: 109 mg/dL — ABNORMAL HIGH (ref 65–99)

## 2015-01-26 LAB — COMPREHENSIVE METABOLIC PANEL
ALBUMIN: 4.5 g/dL (ref 3.5–5.0)
ALK PHOS: 33 U/L — AB (ref 38–126)
ALT: 24 U/L (ref 17–63)
AST: 23 U/L (ref 15–41)
Anion gap: 10 (ref 5–15)
BUN: 24 mg/dL — AB (ref 6–20)
CALCIUM: 9.9 mg/dL (ref 8.9–10.3)
CHLORIDE: 109 mmol/L (ref 101–111)
CO2: 20 mmol/L — AB (ref 22–32)
CREATININE: 1.22 mg/dL (ref 0.61–1.24)
GFR calc Af Amer: >60 mL/min (ref >60)
GFR calc non Af Amer: >60 mL/min (ref >60)
GLUCOSE: 93 mg/dL (ref 65–99)
Potassium: 3.9 mmol/L (ref 3.5–5.1)
SODIUM: 139 mmol/L (ref 135–145)
Total Bilirubin: 0.5 mg/dL (ref 0.3–1.2)
Total Protein: 8.2 g/dL — ABNORMAL HIGH (ref 6.5–8.1)

## 2015-01-26 MED ORDER — INSULIN DETEMIR 100 UNIT/ML ~~LOC~~ SOLN
30.0000 [IU] | Freq: Every day | SUBCUTANEOUS | Status: DC
  Administered 2015-01-26 – 2015-01-29 (×4): 30 [IU] via SUBCUTANEOUS
  Filled 2015-01-26 (×6): qty 0.3

## 2015-01-26 MED ORDER — GADOBENATE DIMEGLUMINE 529 MG/ML IV SOLN
20.0000 mL | Freq: Once | INTRAVENOUS | Status: AC | PRN
  Administered 2015-01-26: 20 mL via INTRAVENOUS

## 2015-01-26 MED ORDER — METOPROLOL TARTRATE 12.5 MG HALF TABLET
12.5000 mg | ORAL_TABLET | Freq: Two times a day (BID) | ORAL | Status: DC
  Administered 2015-01-26 – 2015-01-30 (×7): 12.5 mg via ORAL
  Filled 2015-01-26 (×9): qty 1

## 2015-01-26 MED ORDER — TAMSULOSIN HCL 0.4 MG PO CAPS
0.4000 mg | ORAL_CAPSULE | Freq: Every day | ORAL | Status: DC
  Administered 2015-01-26 – 2015-01-29 (×4): 0.4 mg via ORAL
  Filled 2015-01-26 (×5): qty 1

## 2015-01-26 MED ORDER — FENOFIBRIC ACID 105 MG PO TABS: 105.0000 mg | ORAL_TABLET | Freq: Every day | ORAL | Status: DC

## 2015-01-26 MED ORDER — BENAZEPRIL HCL 20 MG PO TABS
20.0000 mg | ORAL_TABLET | Freq: Every day | ORAL | Status: DC
  Administered 2015-01-26 – 2015-01-30 (×5): 20 mg via ORAL
  Filled 2015-01-26 (×5): qty 1

## 2015-01-26 MED ORDER — INSULIN ASPART 100 UNIT/ML ~~LOC~~ SOLN
0.0000 [IU] | Freq: Three times a day (TID) | SUBCUTANEOUS | Status: DC
  Administered 2015-01-26 – 2015-01-28 (×2): 1 [IU] via SUBCUTANEOUS
  Administered 2015-01-29: 3 [IU] via SUBCUTANEOUS

## 2015-01-26 MED ORDER — SENNOSIDES-DOCUSATE SODIUM 8.6-50 MG PO TABS: 1.0000 | ORAL_TABLET | Freq: Every evening | ORAL | Status: DC | PRN

## 2015-01-26 MED ORDER — STROKE: EARLY STAGES OF RECOVERY BOOK
Freq: Once | Status: AC
  Administered 2015-01-26: 10:00:00

## 2015-01-26 MED ORDER — ACETAMINOPHEN 325 MG PO TABS
650.0000 mg | ORAL_TABLET | Freq: Four times a day (QID) | ORAL | Status: DC | PRN
  Administered 2015-01-26 – 2015-01-28 (×3): 650 mg via ORAL
  Filled 2015-01-26 (×3): qty 2

## 2015-01-26 MED ORDER — HYDROCHLOROTHIAZIDE 25 MG PO TABS
25.0000 mg | ORAL_TABLET | Freq: Every day | ORAL | Status: DC
  Administered 2015-01-26 – 2015-01-27 (×2): 25 mg via ORAL
  Filled 2015-01-26 (×2): qty 1

## 2015-01-26 MED ORDER — FENOFIBRATE 160 MG PO TABS: 160.0000 mg | ORAL_TABLET | Freq: Every day | ORAL | Status: DC

## 2015-01-26 MED ORDER — NITROGLYCERIN 0.4 MG SL SUBL
0.4000 mg | SUBLINGUAL_TABLET | SUBLINGUAL | Status: DC | PRN
Start: 2015-01-26 — End: 2015-01-30

## 2015-01-26 MED ORDER — INSULIN ASPART 100 UNIT/ML ~~LOC~~ SOLN: 0.0000 [IU] | Freq: Every day | SUBCUTANEOUS | Status: DC

## 2015-01-26 MED ORDER — SODIUM CHLORIDE 0.9 % IV BOLUS (SEPSIS)
500.0000 mL | Freq: Once | INTRAVENOUS | Status: AC
  Administered 2015-01-26: 500 mL via INTRAVENOUS

## 2015-01-26 MED ORDER — ATORVASTATIN CALCIUM 40 MG PO TABS
40.0000 mg | ORAL_TABLET | Freq: Every day | ORAL | Status: DC
  Administered 2015-01-26 – 2015-01-29 (×4): 40 mg via ORAL
  Filled 2015-01-26 (×5): qty 1

## 2015-01-26 MED ORDER — FENOFIBRATE 54 MG PO TABS
108.0000 mg | ORAL_TABLET | Freq: Every day | ORAL | Status: DC
  Administered 2015-01-26 – 2015-01-30 (×5): 108 mg via ORAL
  Filled 2015-01-26 (×5): qty 2

## 2015-01-26 MED ORDER — ASPIRIN 325 MG PO TABS
325.0000 mg | ORAL_TABLET | Freq: Every day | ORAL | Status: DC
  Administered 2015-01-26 – 2015-01-30 (×5): 325 mg via ORAL
  Filled 2015-01-26 (×5): qty 1

## 2015-01-26 NOTE — ED Notes (Signed)
He is easily aroused from light sleep; and is oriented x 4 with clear speech.  He is aware of imminent transfer to [facility] and has no requests at this time.

## 2015-01-26 NOTE — ED Notes (Signed)
Patient transported to CT 
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
## 2015-01-26 NOTE — ED Notes (Signed)
Patient transported to X-ray 

## 2015-01-26 NOTE — Progress Notes (Signed)
Patient seen and evaluated earlier the same by my associate. Please refer to H&P regarding assessment and plan. Workup still pending.  Physical exam: Gen.: Patient in no acute distress Cardiovascular: S1 and S2 present and within normal limits Pulmonary: Equal chest rise, no increased work of breathing, no wheezes Neuro: Patient moves all extremities, no facial asymmetry  We'll plan on reassessing next a.m.  Cameron Chandler, [employer]

## 2015-01-27 ENCOUNTER — Observation Stay (HOSPITAL_COMMUNITY): Payer: Medicare HMO

## 2015-01-27 ENCOUNTER — Observation Stay (HOSPITAL_BASED_OUTPATIENT_CLINIC_OR_DEPARTMENT_OTHER): Payer: Medicare HMO

## 2015-01-27 DIAGNOSIS — I10 Essential (primary) hypertension: Secondary | ICD-10-CM | POA: Diagnosis not present

## 2015-01-27 DIAGNOSIS — I639 Cerebral infarction, unspecified: Secondary | ICD-10-CM

## 2015-01-27 DIAGNOSIS — R2681 Unsteadiness on feet: Secondary | ICD-10-CM | POA: Diagnosis not present

## 2015-01-27 DIAGNOSIS — R270 Ataxia, unspecified: Secondary | ICD-10-CM | POA: Diagnosis not present

## 2015-01-27 DIAGNOSIS — R29898 Other symptoms and signs involving the musculoskeletal system: Secondary | ICD-10-CM

## 2015-01-27 LAB — CK: Total CK: 191 U/L (ref 49–397)

## 2015-01-27 LAB — TSH: TSH: 3.144 u[IU]/mL (ref 0.350–4.500)

## 2015-01-27 LAB — SEDIMENTATION RATE: Sed Rate: 30 mm/hr — ABNORMAL HIGH (ref 0–16)

## 2015-01-27 LAB — GLUCOSE, CAPILLARY
GLUCOSE-CAPILLARY: 118 mg/dL — AB (ref 65–99)
GLUCOSE-CAPILLARY: 87 mg/dL (ref 65–99)
Glucose-Capillary: 120 mg/dL — ABNORMAL HIGH (ref 65–99)
Glucose-Capillary: 105 mg/dL — ABNORMAL HIGH (ref 65–99)

## 2015-01-27 LAB — URINE CULTURE: Culture: 3000

## 2015-01-27 LAB — FOLATE: FOLATE: 6.7 ng/mL (ref >5.9)

## 2015-01-27 LAB — VITAMIN B12: VITAMIN B 12: 140 pg/mL — AB (ref 180–914)

## 2015-01-27 MED ORDER — SODIUM CHLORIDE 0.9 % IJ SOLN
10.0000 mL | Freq: Two times a day (BID) | INTRAMUSCULAR | Status: DC
  Administered 2015-01-27 – 2015-01-30 (×7): 10 mL via INTRAVENOUS

## 2015-01-27 MED ORDER — TRAMADOL HCL 50 MG PO TABS
50.0000 mg | ORAL_TABLET | Freq: Once | ORAL | Status: AC
  Administered 2015-01-27: 50 mg via ORAL
  Filled 2015-01-27: qty 1

## 2015-01-27 NOTE — Progress Notes (Signed)
VASCULAR LAB PRELIMINARY  PRELIMINARY  PRELIMINARY  PRELIMINARY  Carotid duplex completed.    Preliminary report:  Bilateral:  1-39% ICA stenosis.  Vertebral artery flow is antegrade.     Yama Nielson, RVS 01/27/2015, 3:12 PM
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
## 2015-01-27 NOTE — Progress Notes (Signed)
[facility] PROGRESS NOTE  RACHAEL ZAPANTA QMG:[id number] DOB: 1955-04-19 DOA: 01/25/2015 PCP: [name], MD  Assessment/Plan: Principal Problem:   Unsteadiness on feet - consulted Neurology for assistance - MRI of brain does not report acute stroke - obtain vitamin b12 and folate   Active Problems:   Malignant neoplasm of colon ([place]) - Patient to f/u with oncologist as outpatient    Essential hypertension - BP's low normal. Will d/c HCTZ - Pt is on B blocker and Ace inhibitor    CAD, NATIVE VESSEL - continue aspirin and statin    Malignant neoplasm of prostate ([place]) - Pt to f/u with urologist as outpatient    Diabetes mellitus without complication ([place]) - Currently on diabetic diet, SSI,  and Levemir.  - continue current regimen  Paresthesias at upper extremities - Neuro consulted - No stroke reported on MRI  Code Status: full Family Communication: discussed directly with patient Disposition Plan: Pending further workup   Consultants:  Neurology  Procedures:  None  Antibiotics:  None  HPI/Subjective: Patient states that he has funny sensation of both hands and upper extremities.  Objective: Filed Vitals:   01/27/15 1446  BP: 102/57  Pulse: 73  Temp: 97.9 F (36.6 C)  Resp: 18    Intake/Output Summary (Last 24 hours) at 01/27/15 1542 Last data filed at 01/27/15 1300  Gross per 24 hour  Intake    840 ml  Output      0 ml  Net    840 ml   Filed Weights   01/25/15 2249  Weight: 91.173 kg (201 lb)    Exam:   General:  Pt in nad, alert and awake  Cardiovascular: rrr, no mrg  Respiratory: cta bl, no wheezes  Abdomen: soft, ND, NT  Musculoskeletal: no cyanosis or clubbing  Neuro: sensation to light tough intact at upper and lower extremities. Answers questions appropriately, no facial asymmetry   Data Reviewed: Basic Metabolic Panel:  Recent Labs Lab 01/25/15 2324  NA 139  K 3.9  CL 109  CO2 20*  GLUCOSE 93   BUN 24*  CREATININE 1.22  CALCIUM 9.9   Liver Function Tests:  Recent Labs Lab 01/25/15 2324  AST 23  ALT 24  ALKPHOS 33*  BILITOT 0.5  PROT 8.2*  ALBUMIN 4.5   No results for input(s): LIPASE, AMYLASE in the last 168 hours. No results for input(s): AMMONIA in the last 168 hours. CBC:  Recent Labs Lab 01/25/15 2324  WBC 8.8  NEUTROABS 5.3  HGB 14.0  HCT 40.7  MCV 85.7  PLT 281   Cardiac Enzymes: No results for input(s): CKTOTAL, CKMB, CKMBINDEX, TROPONINI in the last 168 hours. BNP (last 3 results) No results for input(s): BNP in the last 8760 hours.  ProBNP (last 3 results) No results for input(s): PROBNP in the last 8760 hours.  CBG:  Recent Labs Lab 01/26/15 1114 01/26/15 1613 01/26/15 2338 01/27/15 0632 01/27/15 1131  GLUCAP 138* 109* 96 120* 118*    Recent Results (from the past 240 hour(s))  Urine culture     Status: None   Collection Time: 01/26/15 12:47 AM  Result Value Ref Range Status   Specimen Description URINE, CLEAN CATCH  Final   Special Requests NONE  Final   Culture   Final    3,000 COLONIES/mL INSIGNIFICANT GROWTH Performed at [facility]    Report Status 01/27/2015 FINAL  Final     Studies: Dg Chest 2 View  01/26/2015  CLINICAL DATA:  Chest pain and weakness. History of prostate and colon carcinoma EXAM: CHEST  2 VIEW COMPARISON:  December 14, 2013 FINDINGS: There is mild bibasilar lung scarring. There is no edema or consolidation. The heart size and pulmonary vascularity are normal. No adenopathy. There is atherosclerotic calcification in the aorta. There is evidence of an old healed rib fracture on the left inferiorly. There is degenerative change in the thoracic spine. IMPRESSION: Mild bibasilar lung scarring.  No edema or consolidation. Electronically Signed   By: Lowella Grip III M.D.   On: 01/26/2015 00:24   Ct Head Wo Contrast  01/26/2015  CLINICAL DATA:  Knee gave out, fall. Bowel incontinence and  hematuria. History of colon and prostate cancer, diabetes. EXAM: CT HEAD WITHOUT CONTRAST TECHNIQUE: Contiguous axial images were obtained from the base of the skull through the vertex without intravenous contrast. COMPARISON:  Bone scan December 14, 2013 FINDINGS: The ventricles and sulci are normal for age. No intraparenchymal hemorrhage, mass effect nor midline shift. No acute large vascular territory infarcts. No abnormal extra-axial fluid collections. Basal cisterns are patent. Minimal calcific atherosclerosis of the carotid siphons. No skull fracture. Subacute old LEFT minimally depressed nasal bone fracture. The included ocular globes and orbital contents are non-suspicious. The mastoid aircells and included paranasal sinuses are well-aerated. Mild fullness of the adenoidal soft tissues. IMPRESSION: Negative noncontrast CT head for age. Electronically Signed   By: Elon Alas M.D.   On: 01/26/2015 02:48   Mr Brain Wo Contrast  01/26/2015  CLINICAL DATA:  Acute onset weakness of both legs, and unsteady gait. Incontinence of stool. History of prostate cancer, diabetes, hypertension, and hyperlipidemia. EXAM: MRI HEAD WITHOUT AND WITH CONTRAST MRA HEAD WITHOUT CONTRAST TECHNIQUE: Multiplanar, multiecho pulse sequences of the brain and surrounding structures were obtained without and with intravenous contrast. Angiographic images of the head were obtained using MRA technique without contrast. CONTRAST:  76m MULTIHANCE GADOBENATE DIMEGLUMINE 529 MG/ML IV SOLN COMPARISON:  CT head earlier today. FINDINGS: MRI HEAD FINDINGS No evidence for acute infarction, hemorrhage, mass lesion, hydrocephalus, or extra-axial fluid. Normal for age cerebral volume. No significant white matter disease. Unremarkable pituitary and cerebellar tonsils. Mild cervical spondylosis. No foci of chronic hemorrhage. Post infusion, no abnormal enhancement of the brain or meninges. Slight dural calcification over the LEFT frontal  lobe does not appear to be a manifestation of metastatic disease. Extracranial soft tissues unremarkable. Mild chronic sinus disease. No osseous findings to suggest metastatic prostate cancer. MRA HEAD FINDINGS The internal carotid arteries are dolichoectatic but widely patent. The basilar artery is dolichoectatic and widely patent. The LEFT vertebral is the sole contributor. The RIGHT vertebral is severely diseased. It is patent in the neck in its V3 segment, but there severe diminution of flow related enhancement, with segmental areas of narrowing or occlusion throughout the visualized V4 segment. In the absence of acute posterior circulation infarct, significance is uncertain. No proximal stenosis of the LEFT anterior or middle cerebral arteries. RIGHT M1 MCA widely patent. Mildly diseased distal RIGHT A1 ACA and RIGHT A2 ACA. No flow-limiting stenosis of the posterior cerebral arteries. Both superior cerebellar arteries are visualized and patent with remainder of the cerebellar branches are poorly seen No intracranial aneurysm is evident. IMPRESSION: No acute stroke is evident. Normal for age cerebral volume without significant white matter disease. No abnormal intracranial enhancement. Severely diseased but non dominant distal RIGHT vertebral artery of doubtful significance. No flow-limiting stenosis of the carotids, basilar, dominant LEFT vertebral, or proximal  middle cerebral arteries. Electronically Signed   By: Staci Righter M.D.   On: 01/26/2015 16:13   Mr Lumbar Spine W Wo Contrast  01/26/2015  CLINICAL DATA:  Increasing falling with unsteadiness, bilateral leg weakness and loss of bowel control. History of prostate and colon cancer. Initial encounter. EXAM: MRI LUMBAR SPINE WITHOUT AND WITH CONTRAST TECHNIQUE: Multiplanar and multiecho pulse sequences of the lumbar spine were obtained without and with intravenous contrast. CONTRAST:  29m MULTIHANCE GADOBENATE DIMEGLUMINE 529 MG/ML IV SOLN  COMPARISON:  Abdominal pelvic CT 07/30/2006, PET-CT 05/02/2007 and bone scan 12/14/2013. FINDINGS: Prior studies demonstrate 5 lumbar type vertebral bodies. There is a stable grade 1 anterolisthesis at L5-S1 secondary to bilateral L5 pars defects. The alignment is otherwise normal. There is no evidence of acute fracture. The bone marrow is diffusely heterogeneous in signal on T1 and T2 weighted images. However, no suspicious findings are demonstrated on inversion recovery or postcontrast images. There is a possible hemangioma within the left aspect of the upper sacrum. The lumbar pedicles are short on a congenital basis. The conus medullaris extends to the L2 level and appears normal. There is no abnormal intradural enhancement or paraspinal abnormality. Bilateral renal cysts are noted. Sagittal images demonstrate disc degeneration with bulging and osteophytes at T10-11 and T11-12. There is no cord deformity or high-grade foraminal narrowing. There is some right-sided foraminal narrowing at both levels. There are no significant disc space findings at T12-L1 or L1-2. L2-3: Mild disc bulging and facet hypertrophy. No spinal stenosis or nerve root encroachment. L3-4: Mild disc bulging with moderate facet and ligamentous hypertrophy. Mild left foraminal narrowing. L4-5: Moderate disc bulging with moderate facet and ligamentous hypertrophy. There is resulting moderate right-greater-than-left foraminal narrowing with potential encroachment on the exiting L4 nerve roots. The spinal canal and lateral recesses are adequately patent. L5-S1: Chronic bilateral L5 pars defects and disc degeneration. There is severe right-greater-than-left foraminal narrowing with probable chronic bilateral L5 nerve root encroachment. The facet joints are mildly hypertrophied bilaterally and there is prominent epidural fat within the spinal canal. IMPRESSION: 1. Chronic bilateral L5 pars defects, disc degeneration and grade 1 anterolisthesis at  L5-S1 contributing to chronic biforaminal stenosis. Findings are similar to previous CT. 2. Disc bulging, facet and ligamentous hypertrophy at L4-5 contribute to moderate right-greater-than-left foraminal narrowing and potential L4 nerve root encroachment. 3. Mild asymmetric left foraminal narrowing at L2-3. 4. No evidence of metastatic disease or acute osseous findings. 5. The distal thoracic cord and conus medullaris appear unremarkable. Electronically Signed   By: WRichardean SaleM.D.   On: 01/26/2015 16:06   Dg Knee Complete 4 Views Right  01/26/2015  CLINICAL DATA:  Generalized pain.  No history of trauma. EXAM: RIGHT KNEE - COMPLETE 4+ VIEW COMPARISON:  None. FINDINGS: Frontal, lateral, and bilateral oblique views were obtained. There is no fracture or dislocation. There is no appreciable joint effusion. There is moderate narrowing in the medial and lateral compartments. There is slight narrowing of the patellofemoral joint. There is extensive meniscal calcification. There is atherosclerotic calcification in the popliteal artery and distal superficial femoral artery. IMPRESSION: Osteoarthritic change. No fracture or joint effusion. Extensive meniscal calcification is noted. This finding may be seen with osteoarthritis. It may, however, also be seen with calcium pyrophosphate deposition disease which may present clinically as pseudogout. There are foci of atherosclerotic vascular calcification. Electronically Signed   By: WLowella GripIII M.D.   On: 01/26/2015 00:26   Mr MJodene NamHead/brain Wo Cm  01/26/2015  CLINICAL DATA:  Acute onset weakness of both legs, and unsteady gait. Incontinence of stool. History of prostate cancer, diabetes, hypertension, and hyperlipidemia. EXAM: MRI HEAD WITHOUT AND WITH CONTRAST MRA HEAD WITHOUT CONTRAST TECHNIQUE: Multiplanar, multiecho pulse sequences of the brain and surrounding structures were obtained without and with intravenous contrast. Angiographic images of the  head were obtained using MRA technique without contrast. CONTRAST:  26m MULTIHANCE GADOBENATE DIMEGLUMINE 529 MG/ML IV SOLN COMPARISON:  CT head earlier today. FINDINGS: MRI HEAD FINDINGS No evidence for acute infarction, hemorrhage, mass lesion, hydrocephalus, or extra-axial fluid. Normal for age cerebral volume. No significant white matter disease. Unremarkable pituitary and cerebellar tonsils. Mild cervical spondylosis. No foci of chronic hemorrhage. Post infusion, no abnormal enhancement of the brain or meninges. Slight dural calcification over the LEFT frontal lobe does not appear to be a manifestation of metastatic disease. Extracranial soft tissues unremarkable. Mild chronic sinus disease. No osseous findings to suggest metastatic prostate cancer. MRA HEAD FINDINGS The internal carotid arteries are dolichoectatic but widely patent. The basilar artery is dolichoectatic and widely patent. The LEFT vertebral is the sole contributor. The RIGHT vertebral is severely diseased. It is patent in the neck in its V3 segment, but there severe diminution of flow related enhancement, with segmental areas of narrowing or occlusion throughout the visualized V4 segment. In the absence of acute posterior circulation infarct, significance is uncertain. No proximal stenosis of the LEFT anterior or middle cerebral arteries. RIGHT M1 MCA widely patent. Mildly diseased distal RIGHT A1 ACA and RIGHT A2 ACA. No flow-limiting stenosis of the posterior cerebral arteries. Both superior cerebellar arteries are visualized and patent with remainder of the cerebellar branches are poorly seen No intracranial aneurysm is evident. IMPRESSION: No acute stroke is evident. Normal for age cerebral volume without significant white matter disease. No abnormal intracranial enhancement. Severely diseased but non dominant distal RIGHT vertebral artery of doubtful significance. No flow-limiting stenosis of the carotids, basilar, dominant LEFT  vertebral, or proximal middle cerebral arteries. Electronically Signed   By: JStaci RighterM.D.   On: 01/26/2015 16:13    Scheduled Meds: . aspirin  325 mg Oral Daily  . atorvastatin  40 mg Oral q1800  . benazepril  20 mg Oral Daily  . fenofibrate  108 mg Oral Daily  . insulin aspart  0-5 Units Subcutaneous QHS  . insulin aspart  0-9 Units Subcutaneous TID WC  . insulin detemir  30 Units Subcutaneous QHS  . metoprolol tartrate  12.5 mg Oral BID  . sodium chloride  10 mL Intravenous Q12H  . tamsulosin  0.4 mg Oral QPC supper   Continuous Infusions:    Time spent: > 20 minutes    VVelvet Bathe [facility] Pager [phone] If 7PM-7AM, please contact night-coverage at [email or web address], password [email or web address]10/30/2016, 3:42 PM  LOS: 1 day

## 2015-01-28 ENCOUNTER — Ambulatory Visit (HOSPITAL_BASED_OUTPATIENT_CLINIC_OR_DEPARTMENT_OTHER): Payer: Medicare HMO

## 2015-01-28 DIAGNOSIS — I6789 Other cerebrovascular disease: Secondary | ICD-10-CM | POA: Diagnosis not present

## 2015-01-28 DIAGNOSIS — I10 Essential (primary) hypertension: Secondary | ICD-10-CM | POA: Diagnosis not present

## 2015-01-28 DIAGNOSIS — R2681 Unsteadiness on feet: Secondary | ICD-10-CM | POA: Diagnosis not present

## 2015-01-28 LAB — GLUCOSE, CAPILLARY
GLUCOSE-CAPILLARY: 113 mg/dL — AB (ref 65–99)
Glucose-Capillary: 142 mg/dL — ABNORMAL HIGH (ref 65–99)
Glucose-Capillary: 90 mg/dL (ref 65–99)
Glucose-Capillary: 153 mg/dL — ABNORMAL HIGH (ref 65–99)

## 2015-01-28 LAB — HEMOGLOBIN A1C
HEMOGLOBIN A1C: 7.6 % — AB (ref 4.8–5.6)
Hgb A1c MFr Bld: 7.4 % — ABNORMAL HIGH (ref 4.8–5.6)
MEAN PLASMA GLUCOSE: 166 mg/dL
Mean Plasma Glucose: 171 mg/dL

## 2015-01-28 NOTE — Progress Notes (Signed)
*  PRELIMINARY RESULTS* Echocardiogram 2D Echocardiogram has been performed.  Leavy Cella 01/28/2015, 10:35 AM

## 2015-01-28 NOTE — Progress Notes (Signed)
Subjective: States both hands feel like sand paper and as if they are burning. This has been going on for 2 weeks.   Exam: Filed Vitals:   01/28/15 0553  BP: 101/78  Pulse: 66  Temp: 98.1 F (36.7 C)  Resp: 20    HEENT-  Normocephalic, no lesions, without obvious abnormality.  Normal external eye and conjunctiva.  Normal TM's bilaterally.  Normal auditory canals and external ears. Normal external nose, mucus membranes and septum.  Normal pharynx. Cardiovascular- S1, S2 normal, pulses palpable throughout   Lungs- chest clear, no wheezing, rales, normal symmetric air entry Abdomen- normal findings: bowel sounds normal Extremities- no edema Lymph-no adenopathy palpable Musculoskeletal-no joint tenderness, deformity or swelling Skin-warm and dry, no hyperpigmentation, vitiligo, or suspicious lesions    Gen: In bed, NAD MS: Alert and oriented. Speech clear, follows all commands.  CN: PERRLA, old left facial droop, sensation intact Motor: 4/5 bilateral shoulder abduction and 4/5 bilateral hand grip.  Bilateral LE 5/5 Sensory:decreased in both dorsum and palmer surface of hands and lateral aspect of right leg.  DTR:2+ throughout with no AJ  Pertinent tests:   Etta Quill PA-C [facility] [phone]  Impression: MRI C-Spine :C3-4: Disc degeneration with disc bulging and diffuse uncinate spurring. Moderate spinal stenosis. Bilateral foraminal encroachment due to spurring. Bilateral cord hyperintensity. Given the spinal stenosis, the cord hyperintensity is most likely related to myelomalacia. This could be acute or chronic. Demyelinating disease and cord neoplasm considered less likely.   Recommendations: 58 yo M with progressive gait difficulty and bilateral arm weakness. The cervical lesion is certainly in the right location for his symptoms and I think that this is the culprit. I will order the contrasted sequences to rule out malignancy, but there is also some  spondylosis at this leve, and I would favor having neurosurgery evaluate him.   1) MRI C-spine w contrast 2) NSGY evaluation.  3) will continue to follow.    Roland Rack, MD [facility] [phone]  If 7pm- 7am, please page neurology on call as listed in [place].  01/28/2015, 9:51 AM

## 2015-01-28 NOTE — Evaluation (Signed)
Physical Therapy Evaluation Patient Details Name: Cameron Chandler MRN: [id number] DOB: 1955-05-23 Today's Date: 01/28/2015   History of Present Illness  59 y.o. male who presents to the ED with complaints of increased weakness of both legs with increased falls and unsteadiness on his feet for the past 3 weeks. PMH: prostate cancer, CAD, HTN, DM2, colon cancer.   Clinical Impression  Pt admitted with above diagnosis. Pt currently with functional limitations due to the deficits listed below (see PT Problem List).  Pt will benefit from skilled PT to increase their independence and safety with mobility to allow discharge to home with family assistance. Initial ambulation was performed with min guard assistance due to mild instability. Patient did not have any gross loss of balance and was able to ambulate 30 feet with a rw.      Follow Up Recommendations Home health PT;Supervision for mobility/OOB    Equipment Recommendations  Rolling walker with 5" wheels    Recommendations for Other Services       Precautions / Restrictions Precautions Precautions: Fall Restrictions Weight Bearing Restrictions: No      Mobility  Bed Mobility Overal bed mobility: Needs Assistance Bed Mobility: Supine to Sit     Supine to sit: Supervision     General bed mobility comments: using bed rail to assist to sitting  Transfers Overall transfer level: Needs assistance Equipment used: Rolling walker (2 wheeled) Transfers: Sit to/from Stand Sit to Stand: Min guard         General transfer comment: min guard for stability with initial standing.   Ambulation/Gait Ambulation/Gait assistance: Min guard Ambulation Distance (Feet): 30 Feet Assistive device: Rolling walker (2 wheeled) Gait Pattern/deviations: Step-through pattern;Trendelenburg;Trunk flexed Gait velocity: decreased   General Gait Details: mild instability with ambulation but no gross loss of balance. Using rw with mobility, cues  for posture throughout.   Stairs            Wheelchair Mobility    Modified Rankin (Stroke Patients Only)       Balance Overall balance assessment: Needs assistance Sitting-balance support: No upper extremity supported Sitting balance-Leahy Scale: Good     Standing balance support: Bilateral upper extremity supported Standing balance-Leahy Scale: Poor Standing balance comment: using rw                             Pertinent Vitals/Pain Pain Assessment: 0-10 Pain Score: 8  Pain Location: hands and feet Pain Descriptors / Indicators: Tightness;Sore;Pressure Pain Intervention(s): Limited activity within patient's tolerance;Monitored during session    Home Living Family/patient expects to be discharged to:: Private residence Living Arrangements: Children;Other (Comment) (planning to return to statying with his daughter) Available Help at Discharge: Family;Available 24 hours/day Type of Home: House Home Access: Stairs to enter   [employer] of Steps: 1 Home Layout: One level Home Equipment: Cane - single point      Prior Function Level of Independence: Independent with assistive device(s)         Comments: was using a single point cane at home     Hand Dominance        Extremity/Trunk Assessment   Upper Extremity Assessment: Generalized weakness           Lower Extremity Assessment: Generalized weakness         Communication   Communication: No difficulties  Cognition Arousal/Alertness: Awake/alert Behavior During Therapy: WFL for tasks assessed/performed Overall Cognitive Status: Within Functional Limits for tasks  assessed                      General Comments      Exercises        Assessment/Plan    PT Assessment Patient needs continued PT services  PT Diagnosis Difficulty walking;Generalized weakness   PT Problem List Decreased strength;Decreased range of motion;Decreased activity  tolerance;Decreased balance;Decreased mobility;Pain  PT Treatment Interventions DME instruction;Gait training;Stair training;Functional mobility training;Therapeutic activities;Therapeutic exercise;Balance training;Patient/family education   PT Goals (Current goals can be found in the Care Plan section) Acute Rehab PT Goals Patient Stated Goal: be able to walk without the pain. PT Goal Formulation: With patient Time For Goal Achievement: 02/11/15 Potential to Achieve Goals: Fair    Frequency Min 3X/week   Barriers to discharge        Co-evaluation               End of Session Equipment Utilized During Treatment: Gait belt Activity Tolerance: Patient limited by pain Patient left: in chair;with call bell/phone within reach;with chair alarm set Nurse Communication: Mobility status    Functional Assessment Tool Used: clinical judgment Functional Limitation: Mobility: Walking and moving around Mobility: Walking and Moving Around Current Status [phone]): At least 40 percent but less than 60 percent impaired, limited or restricted Mobility: Walking and Moving Around Goal Status [phone]): At least 20 percent but less than 40 percent impaired, limited or restricted    Time: 6803-2122 PT Time Calculation (min) (ACUTE ONLY): 17 min   Charges:   PT Evaluation $Initial PT Evaluation Tier I: 1 Procedure     PT G Codes:   PT G-Codes **NOT FOR INPATIENT CLASS** Functional Assessment Tool Used: clinical judgment Functional Limitation: Mobility: Walking and moving around Mobility: Walking and Moving Around Current Status (Q8250): At least 40 percent but less than 60 percent impaired, limited or restricted Mobility: Walking and Moving Around Goal Status [phone]): At least 20 percent but less than 40 percent impaired, limited or restricted   Cassell Clement, PT, CSCS Pager [phone] Office [phone]   01/28/2015, 11:39 AM

## 2015-01-28 NOTE — Progress Notes (Signed)
[facility] PROGRESS NOTE  Cameron Chandler:[id number] DOB: [birth year] DOA: 01/25/2015 PCP: [name], MD  Assessment/Plan: Principal Problem:   Unsteadiness on feet - consulted Neurology for assistance. Currently they are recommending Neurosurgery evaluation. Will consult. - Agree with MR cervical spine evaluation -  Pt has low vitamin B 12, folate wnl, TSH wnl, rpr non reactive  Active Problems:   Malignant neoplasm of colon ([name]) - Patient to f/u with oncologist as outpatient    Essential hypertension - BP's low normal. Will d/c HCTZ - Pt is on B blocker and Ace inhibitor    CAD, NATIVE VESSEL - continue aspirin and statin    Malignant neoplasm of prostate ([name]) - Pt to f/u with urologist as outpatient    Diabetes mellitus without complication ([name]) - Currently on diabetic diet, SSI,  and Levemir.  - continue current regimen  Paresthesias at upper extremities - Neuro consulted - No stroke reported on MRI  Code Status: full Family Communication: discussed directly with patient Disposition Plan: Pending further workup   Consultants:  Neurology  Neurosurgery  Procedures:  None  Antibiotics:  None  HPI/Subjective: Patient still is complaining of paresthesias at upper extremities  Objective: Filed Vitals:   01/28/15 1515  BP: 110/76  Pulse: 74  Temp: 98.1 F (36.7 C)  Resp: 20   No intake or output data in the 24 hours ending 01/28/15 1707 Filed Weights   01/25/15 2249  Weight: 91.173 kg (201 lb)    Exam:   General:  Pt in nad, alert and awake  Cardiovascular: rrr, no mrg  Respiratory: cta bl, no wheezes  Abdomen: soft, ND, NT  Musculoskeletal: no cyanosis or clubbing  Neuro: sensation to light tough intact at upper and lower extremities. Answers questions appropriately, no facial asymmetry   Data Reviewed: Basic Metabolic Panel:  Recent Labs Lab 01/25/15 2324  NA 139  K 3.9  CL 109  CO2 20*  GLUCOSE 93   BUN 24*  CREATININE 1.22  CALCIUM 9.9   Liver Function Tests:  Recent Labs Lab 01/25/15 2324  AST 23  ALT 24  ALKPHOS 33*  BILITOT 0.5  PROT 8.2*  ALBUMIN 4.5   No results for input(s): LIPASE, AMYLASE in the last 168 hours. No results for input(s): AMMONIA in the last 168 hours. CBC:  Recent Labs Lab 01/25/15 2324  WBC 8.8  NEUTROABS 5.3  HGB 14.0  HCT 40.7  MCV 85.7  PLT 281   Cardiac Enzymes:  Recent Labs Lab 01/27/15 1930  CKTOTAL 191   BNP (last 3 results) No results for input(s): BNP in the last 8760 hours.  ProBNP (last 3 results) No results for input(s): PROBNP in the last 8760 hours.  CBG:  Recent Labs Lab 01/27/15 1622 01/27/15 2210 01/28/15 0651 01/28/15 1145 01/28/15 1631  GLUCAP 87 105* 113* 142* 90    Recent Results (from the past 240 hour(s))  Urine culture     Status: None   Collection Time: 01/26/15 12:47 AM  Result Value Ref Range Status   Specimen Description URINE, CLEAN CATCH  Final   Special Requests NONE  Final   Culture   Final    3,000 COLONIES/mL INSIGNIFICANT GROWTH Performed at [facility]    Report Status 01/27/2015 FINAL  Final     Studies: Mr Cervical Spine Wo Contrast  01/27/2015  CLINICAL DATA:  Quadriparesis. History of prostate cancer and colon cancer. Increasing weakness in both legs over the past 3 weeks EXAM:  MRI CERVICAL SPINE WITHOUT CONTRAST TECHNIQUE: Multiplanar, multisequence MR imaging of the cervical spine was performed. No intravenous contrast was administered. COMPARISON:  None. FINDINGS: Normal cervical alignment with straightening of the cervical lordosis. Negative for fracture. Negative for metastatic disease. Heterogeneous bone marrow signal pattern most consistent with multilevel disc degeneration. Spinal cord hyperintensity bilaterally C3-4. There is associated spinal stenosis at this level. Cord hyperintensity most likely is myelomalacia related to cord injury from spinal stenosis.  Remaining cord signal is normal. C2-3: Small central disc protrusion with mild uncinate spurring. No cord deformity C3-4: Disc degeneration with disc bulging and diffuse uncinate spurring. Moderate spinal stenosis. Bilateral foraminal encroachment due to spurring. Bilateral cord hyperintensity. Given the spinal stenosis, the cord hyperintensity is most likely related to myelomalacia. This could be acute or chronic. Demyelinating disease and cord neoplasm considered less likely. C4-5: Disc degeneration and spondylosis causing mild spinal stenosis and moderate foraminal stenosis bilaterally C5-6: Disc degeneration and spondylosis. Right-sided disc and osteophyte complex causing right foraminal encroachment of a moderate degree. Cord flattening on the right due to spurring. C6-7: Disc degeneration and spondylosis. Mild spinal stenosis and mild foraminal stenosis bilaterally C7-T1: Disc degeneration and spondylosis. Mild spinal stenosis and mild foraminal stenosis bilaterally. IMPRESSION: Moderate spinal stenosis C3-4 . There is bilateral cord hyperintensity at this level most likely related to myelomalacia from spinal stenosis. This most likely is a chronic finding based on the appearance. Given history of malignancy, postcontrast imaging is suggested to exclude an enhancing lesion in the cord. Multilevel spondylosis and spinal stenosis as described above. No other cord lesion. Electronically Signed   By: Franchot Gallo M.D.   On: 01/27/2015 19:23    Scheduled Meds: . aspirin  325 mg Oral Daily  . atorvastatin  40 mg Oral q1800  . benazepril  20 mg Oral Daily  . fenofibrate  108 mg Oral Daily  . insulin aspart  0-5 Units Subcutaneous QHS  . insulin aspart  0-9 Units Subcutaneous TID WC  . insulin detemir  30 Units Subcutaneous QHS  . metoprolol tartrate  12.5 mg Oral BID  . sodium chloride  10 mL Intravenous Q12H  . tamsulosin  0.4 mg Oral QPC supper   Continuous Infusions:    Time spent: > 20  minutes    Velvet Bathe  [facility] Pager [phone]. If 7PM-7AM, please contact night-coverage at [email or web address], password [facility] 01/28/2015, 5:07 PM  LOS: 2 days

## 2015-01-29 ENCOUNTER — Observation Stay (HOSPITAL_COMMUNITY): Payer: Medicare HMO

## 2015-01-29 DIAGNOSIS — I10 Essential (primary) hypertension: Secondary | ICD-10-CM | POA: Diagnosis not present

## 2015-01-29 DIAGNOSIS — R2681 Unsteadiness on feet: Secondary | ICD-10-CM | POA: Diagnosis not present

## 2015-01-29 LAB — JO-1 ANTIBODY-IGG: JO-1 ANTIBODY, IGG: NEGATIVE

## 2015-01-29 LAB — HEAVY METALS, BLOOD
Arsenic: 5 ug/L (ref 2–23)
Lead: 1 ug/dL (ref 0–19)
MERCURY: NOT DETECTED ug/L (ref 0.0–14.9)

## 2015-01-29 LAB — SJOGRENS SYNDROME-A EXTRACTABLE NUCLEAR ANTIBODY

## 2015-01-29 LAB — IMMUNOFIXATION ELECTROPHORESIS
IGA: 306 mg/dL (ref 61–437)
IgG (Immunoglobin G), Serum: 1290 mg/dL (ref 700–1600)
IgM, Serum: 104 mg/dL (ref 20–172)
Total Protein ELP: 7.8 g/dL (ref 6.0–8.5)

## 2015-01-29 LAB — GLUCOSE, CAPILLARY
GLUCOSE-CAPILLARY: 203 mg/dL — AB (ref 65–99)
GLUCOSE-CAPILLARY: 236 mg/dL — AB (ref 65–99)
GLUCOSE-CAPILLARY: 121 mg/dL — AB (ref 65–99)
Glucose-Capillary: 119 mg/dL — ABNORMAL HIGH (ref 65–99)

## 2015-01-29 LAB — SJOGRENS SYNDROME-B EXTRACTABLE NUCLEAR ANTIBODY

## 2015-01-29 LAB — RPR: RPR Ser Ql: NONREACTIVE

## 2015-01-29 MED ORDER — GADOBENATE DIMEGLUMINE 529 MG/ML IV SOLN
15.0000 mL | Freq: Once | INTRAVENOUS | Status: AC
  Administered 2015-01-29: 15 mL via INTRAVENOUS

## 2015-01-29 NOTE — Care Management Note (Signed)
Case Management Note  Patient Details  Name: Cameron Chandler MRN: [id number] Date of Birth: [birth year]  Subjective/Objective:                    Action/Plan: Pt admitted with unsteadiness on feet. Patient lives at home with his wife. PT recommending HHPT. CM will continue to follow for discharge needs.   Expected Discharge Date:                  Expected Discharge Plan:  [place]  In-House Referral:     Discharge planning Services     Post Acute Care Choice:    Choice offered to:     DME Arranged:    DME Agency:     HH Arranged:    [place] Agency:     Status of Service:  In process, will continue to follow  Medicare Important Message Given:    Date Medicare IM Given:    Medicare IM give by:    Date Additional Medicare IM Given:    Additional Medicare Important Message give by:     If discussed at [place] of Stay Meetings, dates discussed:    Additional Comments:  Pollie Friar, RN 01/29/2015, 3:52 PM

## 2015-01-29 NOTE — Progress Notes (Signed)
[facility] PROGRESS NOTE  Cameron Chandler IHK:[id number] DOB: Aug 26, 1955 DOA: 01/25/2015 PCP: [name], MD  : Patient is a 59 year old presented with unsteadiness on his feet and complaints of bilateral hand paresthesias. Subsequently neurology consulted and currently is recommending neurosurgical consultation. I discussed case with neurosurgery and currently awaiting final disposition recommendations from neurosurgery  Assessment/Plan: Principal Problem:   Unsteadiness on feet - consulted Neurology for assistance. Currently they are recommending Neurosurgery evaluation. Will consult. -  Pt has low vitamin B 12, folate wnl, TSH wnl, rpr non reactive - PT evaluation  Active Problems:   Malignant neoplasm of colon ([place]) - Patient to f/u with oncologist as outpatient    Essential hypertension - BP's low normal.  HCTZ d/c'd - Pt is on B blocker and Ace inhibitor    CAD, NATIVE VESSEL - continue aspirin and statin    Malignant neoplasm of prostate ([place]) - Pt to f/u with urologist as outpatient    Diabetes mellitus without complication ([place]) - Currently on diabetic diet, SSI,  and Levemir.  - continue current regimen  Paresthesias at upper extremities - Neuro consulted - No stroke reported on MRI - s/p MR cervical spine case discussed with neurosurgeon. Awaiting final disposition recommendations on chart.  Code Status: full Family Communication: discussed directly with patient Disposition Plan: Awaiting Neurosugeon's recommendations   Consultants:  Neurology  Neurosurgery  Procedures:  None  Antibiotics:  None  HPI/Subjective: Patient still is complaining of paresthesias at upper extremities  Objective: Filed Vitals:   01/29/15 1346  BP: 96/60  Pulse: 87  Temp: 98.1 F (36.7 C)  Resp: 18   No intake or output data in the 24 hours ending 01/29/15 1619 Filed Weights   01/25/15 2249  Weight: 91.173 kg (201 lb)    Exam:   General:  Pt  in nad, alert and awake  Cardiovascular: rrr, no mrg  Respiratory: cta bl, no wheezes  Abdomen: soft, ND, NT  Musculoskeletal: no cyanosis or clubbing  Neuro: sensation to light tough intact at upper and lower extremities. Answers questions appropriately, no facial asymmetry   Data Reviewed: Basic Metabolic Panel:  Recent Labs Lab 01/25/15 2324  NA 139  K 3.9  CL 109  CO2 20*  GLUCOSE 93  BUN 24*  CREATININE 1.22  CALCIUM 9.9   Liver Function Tests:  Recent Labs Lab 01/25/15 2324  AST 23  ALT 24  ALKPHOS 33*  BILITOT 0.5  PROT 8.2*  ALBUMIN 4.5   No results for input(s): LIPASE, AMYLASE in the last 168 hours. No results for input(s): AMMONIA in the last 168 hours. CBC:  Recent Labs Lab 01/25/15 2324  WBC 8.8  NEUTROABS 5.3  HGB 14.0  HCT 40.7  MCV 85.7  PLT 281   Cardiac Enzymes:  Recent Labs Lab 01/27/15 1930  CKTOTAL 191   BNP (last 3 results) No results for input(s): BNP in the last 8760 hours.  ProBNP (last 3 results) No results for input(s): PROBNP in the last 8760 hours.  CBG:  Recent Labs Lab 01/28/15 1145 01/28/15 1631 01/28/15 2143 01/29/15 0626 01/29/15 1125  GLUCAP 142* 90 153* 119* 203*    Recent Results (from the past 240 hour(s))  Urine culture     Status: None   Collection Time: 01/26/15 12:47 AM  Result Value Ref Range Status   Specimen Description URINE, CLEAN CATCH  Final   Special Requests NONE  Final   Culture   Final    3,000 COLONIES/mL  INSIGNIFICANT GROWTH Performed at [facility]    Report Status 01/27/2015 FINAL  Final     Studies: Mr Cervical Spine Wo Contrast  01/27/2015  CLINICAL DATA:  Quadriparesis. History of prostate cancer and colon cancer. Increasing weakness in both legs over the past 3 weeks EXAM: MRI CERVICAL SPINE WITHOUT CONTRAST TECHNIQUE: Multiplanar, multisequence MR imaging of the cervical spine was performed. No intravenous contrast was administered. COMPARISON:  None.  FINDINGS: Normal cervical alignment with straightening of the cervical lordosis. Negative for fracture. Negative for metastatic disease. Heterogeneous bone marrow signal pattern most consistent with multilevel disc degeneration. Spinal cord hyperintensity bilaterally C3-4. There is associated spinal stenosis at this level. Cord hyperintensity most likely is myelomalacia related to cord injury from spinal stenosis. Remaining cord signal is normal. C2-3: Small central disc protrusion with mild uncinate spurring. No cord deformity C3-4: Disc degeneration with disc bulging and diffuse uncinate spurring. Moderate spinal stenosis. Bilateral foraminal encroachment due to spurring. Bilateral cord hyperintensity. Given the spinal stenosis, the cord hyperintensity is most likely related to myelomalacia. This could be acute or chronic. Demyelinating disease and cord neoplasm considered less likely. C4-5: Disc degeneration and spondylosis causing mild spinal stenosis and moderate foraminal stenosis bilaterally C5-6: Disc degeneration and spondylosis. Right-sided disc and osteophyte complex causing right foraminal encroachment of a moderate degree. Cord flattening on the right due to spurring. C6-7: Disc degeneration and spondylosis. Mild spinal stenosis and mild foraminal stenosis bilaterally C7-T1: Disc degeneration and spondylosis. Mild spinal stenosis and mild foraminal stenosis bilaterally. IMPRESSION: Moderate spinal stenosis C3-4 . There is bilateral cord hyperintensity at this level most likely related to myelomalacia from spinal stenosis. This most likely is a chronic finding based on the appearance. Given history of malignancy, postcontrast imaging is suggested to exclude an enhancing lesion in the cord. Multilevel spondylosis and spinal stenosis as described above. No other cord lesion. Electronically Signed   By: Franchot Gallo M.D.   On: 01/27/2015 19:23   Mr Cervical Spine W Contrast  01/29/2015  CLINICAL DATA:   Symptoms of cervical myelopathy of subacute onset for 2-3 weeks. Leg weakness. Arm sensory symptoms. History of prostate cancer. History of colon cancer. EXAM: MRI CERVICAL SPINE WITH CONTRAST TECHNIQUE: Multiplanar and multiecho pulse sequences of the cervical spine, to include the craniocervical junction and cervicothoracic junction, were obtained according to standard protocol with intravenous contrast. CONTRAST:  28m MULTIHANCE GADOBENATE DIMEGLUMINE 529 MG/ML IV SOLN COMPARISON:  Noncontrast MRI of cervical spine performed 01/27/2015. Pre and postcontrast MRI of the brain 01/26/2015 FINDINGS: Study was performed in follow-up to noncontrast MRI performed 01/27/2015. Post infusion, there is abnormal enhancement of the cord slightly inferior to the area of maximal compression at C3-C4. This enhancement, on sagittal postcontrast fat saturated images, appears to be centrally located. No other areas of abnormal enhancement are seen. Previous MRI brain without and with contrast is reviewed, and there are no abnormal areas of intra-axial enhancement in the brain on that study. IMPRESSION: Abnormal central enhancement of the cervical cord favored to represent hyperemia below an area of severe cord compression as described on the previous noncontrast MRI at the C3-4 level. Intramedullary tumor from prostate cancer and/or colon cancer would be most unlikely. If there are signs of active metastatic disease elsewhere, PET-CT could provide additional information with regard to hypermetabolism as clinically indicated. Demyelinating disease affects the peripheral, not central portions of the cord primarily, and is also not favored. Findings discussed with ordering provider. Electronically Signed   By: JJenny Reichmann  Alfonse Flavors M.D.   On: 01/29/2015 09:51    Scheduled Meds: . aspirin  325 mg Oral Daily  . atorvastatin  40 mg Oral q1800  . benazepril  20 mg Oral Daily  . fenofibrate  108 mg Oral Daily  . insulin aspart  0-5 Units  Subcutaneous QHS  . insulin aspart  0-9 Units Subcutaneous TID WC  . insulin detemir  30 Units Subcutaneous QHS  . metoprolol tartrate  12.5 mg Oral BID  . sodium chloride  10 mL Intravenous Q12H  . tamsulosin  0.4 mg Oral QPC supper   Continuous Infusions:    Time spent: > 20 minutes    Velvet Bathe  [facility] Pager [phone]. If 7PM-7AM, please contact night-coverage at [email or web address], password [facility] 01/29/2015, 4:19 PM  LOS: 3 days

## 2015-01-29 NOTE — Care Management Note (Signed)
Case Management Note  Patient Details  Name: Cameron Chandler MRN: [id number] Date of Birth: [birth year]  Subjective/Objective:                    Action/Plan: Patient with home health recommendations per PT. CM spoke with Dr Wendee Beavers and he agrees with home health services at D/C. CM spoke with the patient and called his daughter [name] per his request with a list of the home health agencies in the [facility] area. [name] chose [facility]. Miranda with [facility] notified and she accepted the referral. Bedside RN updated.   Expected Discharge Date:                  Expected Discharge Plan:  [place]  In-House Referral:     Discharge planning Services     Post Acute Care Choice:    Choice offered to:  Patient, Adult Children  DME Arranged:    DME Agency:     HH Arranged:  PT Juda:  [place]  Status of Service:  In process, will continue to follow  Medicare Important Message Given:    Date Medicare IM Given:    Medicare IM give by:    Date Additional Medicare IM Given:    Additional Medicare Important Message give by:     If discussed at [place] of Stay Meetings, dates discussed:    Additional Comments:  Pollie Friar, RN 01/29/2015, 4:44 PM

## 2015-01-29 NOTE — Progress Notes (Signed)
Subjective: No change over night  Exam: Filed Vitals:   01/29/15 0629  BP: 110/73  Pulse: 72  Temp: 97.8 F (36.6 C)  Resp: 20    HEENT- Normocephalic, no lesions, without obvious abnormality. Normal external eye and conjunctiva. Normal TM's bilaterally. Normal auditory canals and external ears. Normal external nose, mucus membranes and septum. Normal pharynx. Cardiovascular- S1, S2 normal, pulses palpable throughout  Lungs- chest clear, no wheezing, rales, normal symmetric air entry Abdomen- normal findings: bowel sounds normal Extremities- no edema Lymph-no adenopathy palpable Musculoskeletal-no joint tenderness, deformity or swelling Skin-warm and dry, no hyperpigmentation, vitiligo, or suspicious lesions   Gen: In bed, NAD MS: Alert and oriented. Speech clear, follows all commands.  CN: PERRLA, old left facial droop, sensation intact Motor: 4/5 bilateral shoulder abduction and 4/5 bilateral hand grip. Bilateral LE 5/5 Sensory:decreased in both dorsum and palmer surface of hands and lateral aspect of right leg.  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 There was minimal enhancement on the MRI w contrast, but agree that this seems more likley to be an area of hyperemia. I would favor follow up images to ensure this is not tumor, especially if he were to have surgery and continues to worsen.    Recommendations: 1) Neurosurgical consultation.  2) No further recs at this time. Please call with any further questions or concerns.   Roland Rack, MD [facility] [phone]  If 7pm- 7am, please page neurology on call as listed in [place]. 01/29/2015, 9:43 AM

## 2015-01-30 DIAGNOSIS — C189 Malignant neoplasm of colon, unspecified: Secondary | ICD-10-CM | POA: Diagnosis not present

## 2015-01-30 DIAGNOSIS — I10 Essential (primary) hypertension: Secondary | ICD-10-CM | POA: Diagnosis not present

## 2015-01-30 DIAGNOSIS — R2681 Unsteadiness on feet: Principal | ICD-10-CM

## 2015-01-30 DIAGNOSIS — E119 Type 2 diabetes mellitus without complications: Secondary | ICD-10-CM | POA: Diagnosis not present

## 2015-01-30 DIAGNOSIS — C61 Malignant neoplasm of prostate: Secondary | ICD-10-CM

## 2015-01-30 LAB — GLUCOSE, CAPILLARY
GLUCOSE-CAPILLARY: 100 mg/dL — AB (ref 65–99)
GLUCOSE-CAPILLARY: 145 mg/dL — AB (ref 65–99)

## 2015-01-30 LAB — VITAMIN B1: Vitamin B1 (Thiamine): 113.4 nmol/L (ref 66.5–200.0)

## 2015-01-30 MED ORDER — CYANOCOBALAMIN 1000 MCG/ML IJ SOLN
1000.0000 ug | Freq: Every day | INTRAMUSCULAR | Status: DC
  Administered 2015-01-30: 1000 ug via SUBCUTANEOUS
  Filled 2015-01-30: qty 1

## 2015-01-30 MED ORDER — CYANOCOBALAMIN 1000 MCG/ML IJ SOLN: INTRAMUSCULAR | Status: DC

## 2015-01-30 NOTE — Progress Notes (Signed)
Physical Therapy Treatment Patient Details Name: Cameron Chandler MRN: [id number] DOB: [birth year] Today's Date: 01/30/2015    History of Present Illness 59 y.o. male who presents to the ED with complaints of increased weakness of both legs with increased falls and unsteadiness on his feet for the past 3 weeks. PMH: prostate cancer, CAD, HTN, DM2, colon cancer.     PT Comments    Pt progressing towards physical therapy goals. Pt reports improvement in LE symptoms, however states that hands continue to be the most bothersome. Overall grip on RW was good, and pt was able to manage walker well during gait training. Pt tolerated 400 feet without physical assist, however close guard was provided for safety. Pt anticipates d/c home with HHPT to follow either today or tomorrow.   Follow Up Recommendations  Home health PT;Supervision for mobility/OOB     Equipment Recommendations  Rolling walker with 5" wheels    Recommendations for Other Services       Precautions / Restrictions Precautions Precautions: Fall Restrictions Weight Bearing Restrictions: No    Mobility  Bed Mobility               General bed mobility comments: Pt sitting up in recliner upon PT arrival.   Transfers Overall transfer level: Needs assistance Equipment used: Rolling walker (2 wheeled) Transfers: Sit to/from Stand Sit to Stand: Min guard         General transfer comment: min guard for stability with initial standing.   Ambulation/Gait Ambulation/Gait assistance: Min guard Ambulation Distance (Feet): 400 Feet Assistive device: Rolling walker (2 wheeled) Gait Pattern/deviations: Step-through pattern;Decreased stride length;Trunk flexed Gait velocity: decreased Gait velocity interpretation: Below normal speed for age/gender General Gait Details: Slow at first but generally steady. As patient walked, he was able to improve gait speed and appeared more comfortable with ambulation. Mild instability  at times.    Stairs            Wheelchair Mobility    Modified Rankin (Stroke Patients Only)       Balance Overall balance assessment: Needs assistance Sitting-balance support: Feet supported;No upper extremity supported Sitting balance-Leahy Scale: Good     Standing balance support: No upper extremity supported;During functional activity Standing balance-Leahy Scale: Fair Standing balance comment: Static                    Cognition Arousal/Alertness: Awake/alert Behavior During Therapy: WFL for tasks assessed/performed Overall Cognitive Status: Within Functional Limits for tasks assessed                      Exercises General Exercises - Lower Extremity Long Arc Quad: 15 reps;Right;Left Hip ABduction/ADduction: 15 reps (isometric pillow squeezes)    General Comments General comments (skin integrity, edema, etc.): Issued putty for pt as he was asking for something to strengthen his hands - felt the need to keep them moving due to discomfort from neuro symptoms.      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 PT Goal Formulation: With patient Time For Goal Achievement: 02/11/15 Potential to Achieve Goals: Fair Progress towards PT goals: Progressing toward goals    Frequency  Min 3X/week    PT Plan Current plan remains appropriate    Co-evaluation             End of Session Equipment Utilized During Treatment: Gait belt Activity Tolerance: Patient tolerated treatment well Patient left: in  chair;with call bell/phone within reach;with chair alarm set     Time: 4970-2637 PT Time Calculation (min) (ACUTE ONLY): 21 min  Charges:  $Gait Training: 8-22 mins                    G Codes:      Rolinda Roan 2015-02-08, 2:41 PM   Rolinda Roan, PT, DPT Acute Rehabilitation Services Pager: [phone]

## 2015-01-30 NOTE — Progress Notes (Signed)
Discharge orders received, Pt for discharge home today. IV d/c'd. D/c instructions and RX given with verbalized understanding. Family at bedside to assist patient with discharge. Staff bought pt downstairs via wheelchair. 01/30/15 1744

## 2015-01-30 NOTE — Discharge Summary (Signed)
PATIENT DETAILS Name: Cameron Chandler Age: 59 y.o. Sex: male Date of Birth: 1955/10/06 MRN: [id number]. Admitting Physician: Theressa Millard, MD LKT:GYBW Cameron Fireman, MD  Admit Date: 01/25/2015 Discharge date: 01/30/2015  Recommendations for Outpatient Follow-up:  1. Needs vitamin B12 supplementation-see below  2. Please ensure follow-up with neurosurgery-see below  3. Intrinsic factor and parietal cell antibody ordered prior to discharge-these will need to be followed  PRIMARY DISCHARGE DIAGNOSIS:  Principal Problem:   Unsteadiness on feet Active Problems:   Malignant neoplasm of colon (HCC)   Essential hypertension   CAD, NATIVE VESSEL   Malignant neoplasm of prostate (HCC)   Diabetes mellitus without complication (HCC)   Tobacco use disorder   Weakness of both legs      PAST MEDICAL HISTORY: Past Medical History  Diagnosis Date  . Unspecified essential hypertension   . Other and unspecified hyperlipidemia   . Coronary atherosclerosis of native coronary artery   . Colon cancer ([place])   . Prostate cancer ([place]) 12/13/12    gleason 6  . Myocardial infarction ([place])   . Diabetes mellitus without complication ([place])   . Arthritis     back, joint pain  . Heart attack ([place])     DISCHARGE MEDICATIONS: Current Discharge Medication List    START taking these medications   Details  cyanocobalamin (,VITAMIN B-12,) 1000 MCG/ML injection 1000 g subcutaneously daily for 6 more days, and then once a month after that. Qty: 30 mL, Refills: 0      CONTINUE these medications which have NOT CHANGED   Details  aspirin 325 MG tablet Take 325 mg by mouth daily.    benazepril (LOTENSIN) 20 MG tablet Take 1 tablet (20 mg total) by mouth daily. Qty: 30 tablet, Refills: 3    Fenofibric Acid 105 MG TABS Take 105 mg by mouth daily.     gabapentin (NEURONTIN) 100 MG capsule Take 1 capsule (100 mg total) by mouth 3 (three) times daily. Qty: 90 capsule, Refills: 0      hydrochlorothiazide (HYDRODIURIL) 25 MG tablet Take 25 mg by mouth daily.    insulin detemir (LEVEMIR) 100 UNIT/ML injection Inject 30 Units into the skin at bedtime.     meloxicam (MOBIC) 7.5 MG tablet Take 7.5 mg by mouth daily.    metFORMIN (GLUCOPHAGE) 1000 MG tablet Take 1,000 mg by mouth 2 (two) times daily with a meal.    metoprolol tartrate (LOPRESSOR) 25 MG tablet Take 0.5 tablets (12.5 mg total) by mouth 2 (two) times daily. Qty: 180 tablet, Refills: 3    nitroGLYCERIN (NITROSTAT) 0.4 MG SL tablet Place 1 tablet (0.4 mg total) under the tongue every 5 (five) minutes as needed. Qty: 25 tablet, Refills: 3    simvastatin (ZOCOR) 80 MG tablet TAKE 1 TABLET BY MOUTH EVERY DAY --- PATIENT NEEDS TO SCHEDULE AN OFFICE VISIT FOR FURTHER REFILLS Qty: 30 tablet, Refills: 3    tamsulosin (FLOMAX) 0.4 MG CAPS capsule Take 0.4 mg by mouth daily after supper.      STOP taking these medications     aspirin EC 81 MG tablet         ALLERGIES:  No Known Allergies  BRIEF HPI:  See H&P, Labs, Consult and Test reports for all details in brief, patient is a 59 y.o. male with a history of Prostate Cancer, CAD, HTN, DM2, and Hyperlipidemia and previous Colon Cancer S/P resection who resented with unsteady gait, and weakness of both upper and lower extremities. He was  admitted for further evaluation and treatment  CONSULTATIONS:   neurology and Phone consult with neurosurgery  PERTINENT RADIOLOGIC STUDIES: Dg Chest 2 View  01/26/2015  CLINICAL DATA:  Chest pain and weakness. History of prostate and colon carcinoma EXAM: CHEST  2 VIEW COMPARISON:  December 14, 2013 FINDINGS: There is mild bibasilar lung scarring. There is no edema or consolidation. The heart size and pulmonary vascularity are normal. No adenopathy. There is atherosclerotic calcification in the aorta. There is evidence of an old healed rib fracture on the left inferiorly. There is degenerative change in the thoracic spine.  IMPRESSION: Mild bibasilar lung scarring.  No edema or consolidation. Electronically Signed   By: Lowella Grip III M.D.   On: 01/26/2015 00:24   Ct Head Wo Contrast  01/26/2015  CLINICAL DATA:  Knee gave out, fall. Bowel incontinence and hematuria. History of colon and prostate cancer, diabetes. EXAM: CT HEAD WITHOUT CONTRAST TECHNIQUE: Contiguous axial images were obtained from the base of the skull through the vertex without intravenous contrast. COMPARISON:  Bone scan December 14, 2013 FINDINGS: The ventricles and sulci are normal for age. No intraparenchymal hemorrhage, mass effect nor midline shift. No acute large vascular territory infarcts. No abnormal extra-axial fluid collections. Basal cisterns are patent. Minimal calcific atherosclerosis of the carotid siphons. No skull fracture. Subacute old LEFT minimally depressed nasal bone fracture. The included ocular globes and orbital contents are non-suspicious. The mastoid aircells and included paranasal sinuses are well-aerated. Mild fullness of the adenoidal soft tissues. IMPRESSION: Negative noncontrast CT head for age. Electronically Signed   By: Elon Alas M.D.   On: 01/26/2015 02:48   Mr Brain Wo Contrast  01/26/2015  CLINICAL DATA:  Acute onset weakness of both legs, and unsteady gait. Incontinence of stool. History of prostate cancer, diabetes, hypertension, and hyperlipidemia. EXAM: MRI HEAD WITHOUT AND WITH CONTRAST MRA HEAD WITHOUT CONTRAST TECHNIQUE: Multiplanar, multiecho pulse sequences of the brain and surrounding structures were obtained without and with intravenous contrast. Angiographic images of the head were obtained using MRA technique without contrast. CONTRAST:  27m MULTIHANCE GADOBENATE DIMEGLUMINE 529 MG/ML IV SOLN COMPARISON:  CT head earlier today. FINDINGS: MRI HEAD FINDINGS No evidence for acute infarction, hemorrhage, mass lesion, hydrocephalus, or extra-axial fluid. Normal for age cerebral volume. No  significant white matter disease. Unremarkable pituitary and cerebellar tonsils. Mild cervical spondylosis. No foci of chronic hemorrhage. Post infusion, no abnormal enhancement of the brain or meninges. Slight dural calcification over the LEFT frontal lobe does not appear to be a manifestation of metastatic disease. Extracranial soft tissues unremarkable. Mild chronic sinus disease. No osseous findings to suggest metastatic prostate cancer. MRA HEAD FINDINGS The internal carotid arteries are dolichoectatic but widely patent. The basilar artery is dolichoectatic and widely patent. The LEFT vertebral is the sole contributor. The RIGHT vertebral is severely diseased. It is patent in the neck in its V3 segment, but there severe diminution of flow related enhancement, with segmental areas of narrowing or occlusion throughout the visualized V4 segment. In the absence of acute posterior circulation infarct, significance is uncertain. No proximal stenosis of the LEFT anterior or middle cerebral arteries. RIGHT M1 MCA widely patent. Mildly diseased distal RIGHT A1 ACA and RIGHT A2 ACA. No flow-limiting stenosis of the posterior cerebral arteries. Both superior cerebellar arteries are visualized and patent with remainder of the cerebellar branches are poorly seen No intracranial aneurysm is evident. IMPRESSION: No acute stroke is evident. Normal for age cerebral volume without significant white matter disease. No  abnormal intracranial enhancement. Severely diseased but non dominant distal RIGHT vertebral artery of doubtful significance. No flow-limiting stenosis of the carotids, basilar, dominant LEFT vertebral, or proximal middle cerebral arteries. Electronically Signed   By: Staci Righter M.D.   On: 01/26/2015 16:13   Mr Cervical Spine Wo Contrast  01/27/2015  CLINICAL DATA:  Quadriparesis. History of prostate cancer and colon cancer. Increasing weakness in both legs over the past 3 weeks EXAM: MRI CERVICAL SPINE  WITHOUT CONTRAST TECHNIQUE: Multiplanar, multisequence MR imaging of the cervical spine was performed. No intravenous contrast was administered. COMPARISON:  None. FINDINGS: Normal cervical alignment with straightening of the cervical lordosis. Negative for fracture. Negative for metastatic disease. Heterogeneous bone marrow signal pattern most consistent with multilevel disc degeneration. Spinal cord hyperintensity bilaterally C3-4. There is associated spinal stenosis at this level. Cord hyperintensity most likely is myelomalacia related to cord injury from spinal stenosis. Remaining cord signal is normal. C2-3: Small central disc protrusion with mild uncinate spurring. No cord deformity C3-4: Disc degeneration with disc bulging and diffuse uncinate spurring. Moderate spinal stenosis. Bilateral foraminal encroachment due to spurring. Bilateral cord hyperintensity. Given the spinal stenosis, the cord hyperintensity is most likely related to myelomalacia. This could be acute or chronic. Demyelinating disease and cord neoplasm considered less likely. C4-5: Disc degeneration and spondylosis causing mild spinal stenosis and moderate foraminal stenosis bilaterally C5-6: Disc degeneration and spondylosis. Right-sided disc and osteophyte complex causing right foraminal encroachment of a moderate degree. Cord flattening on the right due to spurring. C6-7: Disc degeneration and spondylosis. Mild spinal stenosis and mild foraminal stenosis bilaterally C7-T1: Disc degeneration and spondylosis. Mild spinal stenosis and mild foraminal stenosis bilaterally. IMPRESSION: Moderate spinal stenosis C3-4 . There is bilateral cord hyperintensity at this level most likely related to myelomalacia from spinal stenosis. This most likely is a chronic finding based on the appearance. Given history of malignancy, postcontrast imaging is suggested to exclude an enhancing lesion in the cord. Multilevel spondylosis and spinal stenosis as  described above. No other cord lesion. Electronically Signed   By: Franchot Gallo M.D.   On: 01/27/2015 19:23   Mr Cervical Spine W Contrast  01/29/2015  CLINICAL DATA:  Symptoms of cervical myelopathy of subacute onset for 2-3 weeks. Leg weakness. Arm sensory symptoms. History of prostate cancer. History of colon cancer. EXAM: MRI CERVICAL SPINE WITH CONTRAST TECHNIQUE: Multiplanar and multiecho pulse sequences of the cervical spine, to include the craniocervical junction and cervicothoracic junction, were obtained according to standard protocol with intravenous contrast. CONTRAST:  4m MULTIHANCE GADOBENATE DIMEGLUMINE 529 MG/ML IV SOLN COMPARISON:  Noncontrast MRI of cervical spine performed 01/27/2015. Pre and postcontrast MRI of the brain 01/26/2015 FINDINGS: Study was performed in follow-up to noncontrast MRI performed 01/27/2015. Post infusion, there is abnormal enhancement of the cord slightly inferior to the area of maximal compression at C3-C4. This enhancement, on sagittal postcontrast fat saturated images, appears to be centrally located. No other areas of abnormal enhancement are seen. Previous MRI brain without and with contrast is reviewed, and there are no abnormal areas of intra-axial enhancement in the brain on that study. IMPRESSION: Abnormal central enhancement of the cervical cord favored to represent hyperemia below an area of severe cord compression as described on the previous noncontrast MRI at the C3-4 level. Intramedullary tumor from prostate cancer and/or colon cancer would be most unlikely. If there are signs of active metastatic disease elsewhere, PET-CT could provide additional information with regard to hypermetabolism as clinically indicated. Demyelinating disease affects  the peripheral, not central portions of the cord primarily, and is also not favored. Findings discussed with ordering provider. Electronically Signed   By: Staci Righter M.D.   On: 01/29/2015 09:51   Mr Lumbar  Spine W Wo Contrast  01/26/2015  CLINICAL DATA:  Increasing falling with unsteadiness, bilateral leg weakness and loss of bowel control. History of prostate and colon cancer. Initial encounter. EXAM: MRI LUMBAR SPINE WITHOUT AND WITH CONTRAST TECHNIQUE: Multiplanar and multiecho pulse sequences of the lumbar spine were obtained without and with intravenous contrast. CONTRAST:  5m MULTIHANCE GADOBENATE DIMEGLUMINE 529 MG/ML IV SOLN COMPARISON:  Abdominal pelvic CT 07/30/2006, PET-CT 05/02/2007 and bone scan 12/14/2013. FINDINGS: Prior studies demonstrate 5 lumbar type vertebral bodies. There is a stable grade 1 anterolisthesis at L5-S1 secondary to bilateral L5 pars defects. The alignment is otherwise normal. There is no evidence of acute fracture. The bone marrow is diffusely heterogeneous in signal on T1 and T2 weighted images. However, no suspicious findings are demonstrated on inversion recovery or postcontrast images. There is a possible hemangioma within the left aspect of the upper sacrum. The lumbar pedicles are short on a congenital basis. The conus medullaris extends to the L2 level and appears normal. There is no abnormal intradural enhancement or paraspinal abnormality. Bilateral renal cysts are noted. Sagittal images demonstrate disc degeneration with bulging and osteophytes at T10-11 and T11-12. There is no cord deformity or high-grade foraminal narrowing. There is some right-sided foraminal narrowing at both levels. There are no significant disc space findings at T12-L1 or L1-2. L2-3: Mild disc bulging and facet hypertrophy. No spinal stenosis or nerve root encroachment. L3-4: Mild disc bulging with moderate facet and ligamentous hypertrophy. Mild left foraminal narrowing. L4-5: Moderate disc bulging with moderate facet and ligamentous hypertrophy. There is resulting moderate right-greater-than-left foraminal narrowing with potential encroachment on the exiting L4 nerve roots. The spinal canal and  lateral recesses are adequately patent. L5-S1: Chronic bilateral L5 pars defects and disc degeneration. There is severe right-greater-than-left foraminal narrowing with probable chronic bilateral L5 nerve root encroachment. The facet joints are mildly hypertrophied bilaterally and there is prominent epidural fat within the spinal canal. IMPRESSION: 1. Chronic bilateral L5 pars defects, disc degeneration and grade 1 anterolisthesis at L5-S1 contributing to chronic biforaminal stenosis. Findings are similar to previous CT. 2. Disc bulging, facet and ligamentous hypertrophy at L4-5 contribute to moderate right-greater-than-left foraminal narrowing and potential L4 nerve root encroachment. 3. Mild asymmetric left foraminal narrowing at L2-3. 4. No evidence of metastatic disease or acute osseous findings. 5. The distal thoracic cord and conus medullaris appear unremarkable. Electronically Signed   By: WRichardean SaleM.D.   On: 01/26/2015 16:06   Dg Knee Complete 4 Views Right  01/26/2015  CLINICAL DATA:  Generalized pain.  No history of trauma. EXAM: RIGHT KNEE - COMPLETE 4+ VIEW COMPARISON:  None. FINDINGS: Frontal, lateral, and bilateral oblique views were obtained. There is no fracture or dislocation. There is no appreciable joint effusion. There is moderate narrowing in the medial and lateral compartments. There is slight narrowing of the patellofemoral joint. There is extensive meniscal calcification. There is atherosclerotic calcification in the popliteal artery and distal superficial femoral artery. IMPRESSION: Osteoarthritic change. No fracture or joint effusion. Extensive meniscal calcification is noted. This finding may be seen with osteoarthritis. It may, however, also be seen with calcium pyrophosphate deposition disease which may present clinically as pseudogout. There are foci of atherosclerotic vascular calcification. Electronically Signed   By: WLowella GripIII  M.D.   On: 01/26/2015 00:26   Mr  Jodene Nam Head/brain Wo Cm  01/26/2015  CLINICAL DATA:  Acute onset weakness of both legs, and unsteady gait. Incontinence of stool. History of prostate cancer, diabetes, hypertension, and hyperlipidemia. EXAM: MRI HEAD WITHOUT AND WITH CONTRAST MRA HEAD WITHOUT CONTRAST TECHNIQUE: Multiplanar, multiecho pulse sequences of the brain and surrounding structures were obtained without and with intravenous contrast. Angiographic images of the head were obtained using MRA technique without contrast. CONTRAST:  86m MULTIHANCE GADOBENATE DIMEGLUMINE 529 MG/ML IV SOLN COMPARISON:  CT head earlier today. FINDINGS: MRI HEAD FINDINGS No evidence for acute infarction, hemorrhage, mass lesion, hydrocephalus, or extra-axial fluid. Normal for age cerebral volume. No significant white matter disease. Unremarkable pituitary and cerebellar tonsils. Mild cervical spondylosis. No foci of chronic hemorrhage. Post infusion, no abnormal enhancement of the brain or meninges. Slight dural calcification over the LEFT frontal lobe does not appear to be a manifestation of metastatic disease. Extracranial soft tissues unremarkable. Mild chronic sinus disease. No osseous findings to suggest metastatic prostate cancer. MRA HEAD FINDINGS The internal carotid arteries are dolichoectatic but widely patent. The basilar artery is dolichoectatic and widely patent. The LEFT vertebral is the sole contributor. The RIGHT vertebral is severely diseased. It is patent in the neck in its V3 segment, but there severe diminution of flow related enhancement, with segmental areas of narrowing or occlusion throughout the visualized V4 segment. In the absence of acute posterior circulation infarct, significance is uncertain. No proximal stenosis of the LEFT anterior or middle cerebral arteries. RIGHT M1 MCA widely patent. Mildly diseased distal RIGHT A1 ACA and RIGHT A2 ACA. No flow-limiting stenosis of the posterior cerebral arteries. Both superior cerebellar arteries  are visualized and patent with remainder of the cerebellar branches are poorly seen No intracranial aneurysm is evident. IMPRESSION: No acute stroke is evident. Normal for age cerebral volume without significant white matter disease. No abnormal intracranial enhancement. Severely diseased but non dominant distal RIGHT vertebral artery of doubtful significance. No flow-limiting stenosis of the carotids, basilar, dominant LEFT vertebral, or proximal middle cerebral arteries. Electronically Signed   By: JStaci RighterM.D.   On: 01/26/2015 16:13     PERTINENT LAB RESULTS: CBC: No results for input(s): WBC, HGB, HCT, PLT in the last 72 hours. CMET CMP     Component Value Date/Time   NA 139 01/25/2015 2324   K 3.9 01/25/2015 2324   CL 109 01/25/2015 2324   CO2 20* 01/25/2015 2324   GLUCOSE 93 01/25/2015 2324   BUN 24* 01/25/2015 2324   CREATININE 1.22 01/25/2015 2324   CALCIUM 9.9 01/25/2015 2324   PROT 8.2* 01/25/2015 2324   ALBUMIN 4.5 01/25/2015 2324   AST 23 01/25/2015 2324   ALT 24 01/25/2015 2324   ALKPHOS 33* 01/25/2015 2324   BILITOT 0.5 01/25/2015 2324   GFRNONAA >60 01/25/2015 2324   GFRAA >60 01/25/2015 2324    GFR Estimated Creatinine Clearance: 67.3 mL/min (by C-G formula based on Cr of 1.22). No results for input(s): LIPASE, AMYLASE in the last 72 hours.  Recent Labs  01/27/15 1930  CKTOTAL 191   Invalid input(s): POCBNP No results for input(s): DDIMER in the last 72 hours. No results for input(s): HGBA1C in the last 72 hours. No results for input(s): CHOL, HDL, LDLCALC, TRIG, CHOLHDL, LDLDIRECT in the last 72 hours.  Recent Labs  01/27/15 1930  TSH 3.144    Recent Labs  01/27/15 1645  VITAMINB12 140*  FOLATE 6.7  Coags: No results for input(s): INR in the last 72 hours.  Invalid input(s): PT Microbiology: Recent Results (from the past 240 hour(s))  Urine culture     Status: None   Collection Time: 01/26/15 12:47 AM  Result Value Ref Range  Status   Specimen Description URINE, CLEAN CATCH  Final   Special Requests NONE  Final   Culture   Final    3,000 COLONIES/mL INSIGNIFICANT GROWTH Performed at [facility]    Report Status 01/27/2015 FINAL  Final     BRIEF HOSPITAL COURSE:   Principal Problem: Compressive cervical myelopathy: Patient presented with gait difficulty and bilateral arm weakness. He was admitted and underwent further workup and neurology consultation. MRI C-spine with and without contrast was done, which showed area of cord compression at C3-C4 level, it also showed a normal central enhancement of the cervical cord favored to represent hyperemia. Case was then subsequently discussed with the neurosurgery (Dr. Mechele Claude Dr. Starr Lake recommended outpatient follow-up with him. Subsequently-this M.D. spoke with Dr. Anette Riedel the phone-who reviewed the MRI and the chart-and advised outpatient follow-up with Dr. Annette Stable in 1 week. This was discussed with the patient, he was agreeable with this plan-he will call the neurosurgical office tomorrow and get an appointment. Patient was seen by physical therapy, recommendations are for home health physical therapy on discharge.  Active Problems: Vitamin B12 deficiency: Started on vitamin B12 supplementation-we will need another 6 more days of subcutaneous vitamin B-12, following which he will need monthly dosing. Intrinsic factor and parietal cell antibody ordered prior to discharge-these will need to be followed  Hypertension: Controlled-continue with benazepril, metoprolol  Dyslipidemia: Continue statin and fenofibrates  Diabetes: Continue Levemir and metformin  BPH: Continue Flomax  History of colon cancer status post colectomy-outpatient follow with oncology  History of prostate cancer status post radiation treatment: Outpatient follow-up with oncology  TODAY-DAY OF DISCHARGE:  Subjective:   Cameron Chandler today has no headache,no chest abdominal  pain,no new weakness tingling or numbness, feels much better wants to go home today.   Objective:   Blood pressure 100/77, pulse 73, temperature 97.9 F (36.6 C), temperature source Oral, resp. rate 18, height '5\' 6"'$  (1.676 m), weight 91.173 kg (201 lb), SpO2 100 %. No intake or output data in the 24 hours ending 01/30/15 1559 Filed Weights   01/25/15 2249  Weight: 91.173 kg (201 lb)    Exam Awake Alert, Oriented *3, No new F.N deficits, Normal affect [place].AT,PERRAL Supple Neck,No JVD, No cervical lymphadenopathy appriciated.  Symmetrical Chest wall movement, Good air movement bilaterally, CTAB RRR,No Gallops,Rubs or new Murmurs, No Parasternal Heave +ve B.Sounds, Abd Soft, Non tender, No organomegaly appriciated, No rebound -guarding or rigidity. No Cyanosis, Clubbing or edema, No new Rash or bruise  DISCHARGE CONDITION: Stable  DISPOSITION: Home with home health services  DISCHARGE INSTRUCTIONS:    Activity:  As tolerated with Full fall precautions use walker/cane & assistance as needed  Get Medicines reviewed and adjusted: Please take all your medications with you for your next visit with your Primary MD  Please request your Primary MD to go over all hospital tests and procedure/radiological results at the follow up, please ask your Primary MD to get all Hospital records sent to his/her office.  If you experience worsening of your admission symptoms, develop shortness of breath, life threatening emergency, suicidal or homicidal thoughts you must seek medical attention immediately by calling 911 or calling your MD immediately  if symptoms less severe.  You  must read complete instructions/literature along with all the possible adverse reactions/side effects for all the Medicines you take and that have been prescribed to you. Take any new Medicines after you have completely understood and accpet all the possible adverse reactions/side effects.   Do not drive when taking Pain  medications.   Do not take more than prescribed Pain, Sleep and Anxiety Medications  Special Instructions: If you have smoked or chewed Tobacco  in the last 2 yrs please stop smoking, stop any regular Alcohol  and or any Recreational drug use.  Wear Seat belts while driving.  Please note  You were cared for by a hospitalist during your hospital stay. Once you are discharged, your primary care physician will handle any further medical issues. Please note that NO REFILLS for any discharge medications will be authorized once you are discharged, as it is imperative that you return to your primary care physician (or establish a relationship with a primary care physician if you do not have one) for your aftercare needs so that they can reassess your need for medications and monitor your lab values.   Diet recommendation: Diabetic Diet Heart Healthy diet  Discharge Instructions    Call MD for:  extreme fatigue    Complete by:  As directed      Call MD for:  persistant dizziness or light-headedness    Complete by:  As directed      Call MD for:    Complete by:  As directed   Worsening upper and lower extremity weakness, frequent falls, severe neck pain, worsening paresthesias, bladder or bowel incontinence     Diet - low sodium heart healthy    Complete by:  As directed      Diet Carb Modified    Complete by:  As directed      Increase activity slowly    Complete by:  As directed            Follow-up Information    Follow up with POOL,HENRY A, MD. Schedule an appointment as soon as possible for a visit in 1 week.   Specialty:  Neurosurgery   Why:  Hospital follow up-for cervical spine surgery   Contact information:   [address] [place] [address] [place] [address] [phone]       Follow up with [name], MD. Schedule an appointment as soon as possible for a visit in 1 week.   Specialty:  Internal Medicine   Why:  please ask your primary MD to given you Vit B12  shots   Contact information:   [address] [place] [address] [phone]       Total Time spent on discharge equals 45 minutes.  SignedOren Binet 01/30/2015 3:59 PM

## 2015-01-31 LAB — ANTI-PARIETAL ANTIBODY: PARIETAL CELL ANTIBODY-IGG: 1.9 U (ref 0.0–20.0)

## 2015-01-31 LAB — INTRINSIC FACTOR ANTIBODIES: INTRINSIC FACTOR: 17.6 [AU]/ml — AB (ref 0.0–1.1)

## 2015-02-06 ENCOUNTER — Other Ambulatory Visit (HOSPITAL_COMMUNITY): Payer: Self-pay | Admitting: Neurosurgery

## 2015-02-12 ENCOUNTER — Telehealth: Payer: Self-pay | Admitting: *Deleted

## 2015-02-12 NOTE — Telephone Encounter (Signed)
Noted, will forward note to [place].

## 2015-02-12 NOTE — Telephone Encounter (Signed)
Received fax requesting cardiac clearance for C/5-6 anterior cervical fusion - scheduled for 03/05/2015.

## 2015-02-12 NOTE — Telephone Encounter (Signed)
Can proceed. Normal LVEF in 12/2014.

## 2015-02-14 ENCOUNTER — Other Ambulatory Visit (HOSPITAL_COMMUNITY): Payer: Self-pay | Admitting: Urology

## 2015-02-14 DIAGNOSIS — N2889 Other specified disorders of kidney and ureter: Secondary | ICD-10-CM

## 2015-02-14 DIAGNOSIS — D49519 Neoplasm of unspecified behavior of unspecified kidney: Secondary | ICD-10-CM

## 2015-02-25 ENCOUNTER — Ambulatory Visit (HOSPITAL_COMMUNITY)
Admission: RE | Admit: 2015-02-25 | Discharge: 2015-02-25 | Disposition: A | Discharge status: Home / Self Care | Payer: Medicare HMO | Source: Ambulatory Visit | Attending: Urology | Admitting: Urology

## 2015-02-25 DIAGNOSIS — N2889 Other specified disorders of kidney and ureter: Secondary | ICD-10-CM | POA: Diagnosis present

## 2015-02-25 DIAGNOSIS — D181 Lymphangioma, any site: Secondary | ICD-10-CM | POA: Insufficient documentation

## 2015-02-25 DIAGNOSIS — D49519 Neoplasm of unspecified behavior of unspecified kidney: Secondary | ICD-10-CM | POA: Diagnosis present

## 2015-02-25 DIAGNOSIS — K760 Fatty (change of) liver, not elsewhere classified: Secondary | ICD-10-CM | POA: Insufficient documentation

## 2015-02-25 DIAGNOSIS — N281 Cyst of kidney, acquired: Secondary | ICD-10-CM | POA: Insufficient documentation

## 2015-02-25 MED ORDER — GADOBENATE DIMEGLUMINE 529 MG/ML IV SOLN
18.0000 mL | Freq: Once | INTRAVENOUS | Status: AC | PRN
  Administered 2015-02-25: 18 mL via INTRAVENOUS

## 2015-02-28 ENCOUNTER — Inpatient Hospital Stay (HOSPITAL_COMMUNITY)
Admission: RE | Admit: 2015-02-28 | Discharge: 2015-02-28 | Disposition: A | Discharge status: Home / Self Care | Payer: Medicare HMO | Source: Ambulatory Visit

## 2015-02-28 NOTE — Pre-Procedure Instructions (Signed)
Cameron Chandler  02/28/2015      [facility] - [place], [place] - [place] [address] Phone: [phone] Fax: [phone]    Your procedure is scheduled on 03/05/15.  Report to [facility] Admitting at 6 A.M.  Call this number if you have problems the morning of surgery:  [phone]   Remember:  Do not eat food or drink liquids after midnight.  Take these medicines the morning of surgery with A SIP OF WATER neurontin,metoprolol   Do not wear jewelry, make-up or nail polish.  Do not wear lotions, powders, or perfumes.  You may wear deodorant.  Do not shave 48 hours prior to surgery.  Men may shave face and neck.  Do not bring valuables to the hospital.  [facility] is not responsible for any belongings or valuables.  Contacts, dentures or bridgework may not be worn into surgery.  Leave your suitcase in the car.  After surgery it may be brought to your room.  For patients admitted to the hospital, discharge time will be determined by your treatment team.  Patients discharged the day of surgery will not be allowed to drive home.   Name and phone number of your driver:    Special instructions:   Please read over the following fact sheets that you were given. Pain Booklet, Coughing and Deep Breathing, Blood Transfusion Information, MRSA Information and Surgical Site Infection Prevention How to Manage Your Diabetes Before Surgery   Why is it important to control my blood sugar before and after surgery?   Improving blood sugar levels before and after surgery helps healing and can limit problems.  A way of improving blood sugar control is eating a healthy diet by:  - Eating less sugar and carbohydrates  - Increasing activity/exercise  - Talk with your doctor about reaching your blood sugar goals  High blood sugars (greater than 180 mg/dL) can raise your risk of infections and slow down your recovery so you will need to focus on  controlling your diabetes during the weeks before surgery.  Make sure that the doctor who takes care of your diabetes knows about your planned surgery including the date and location.  How do I manage my blood sugars before surgery?   Check your blood sugar at least 4 times a day, 2 days before surgery to make sure that they are not too high or low.   Check your blood sugar the morning of your surgery when you wake up and every 2               hours until you get to the Short-Stay unit.  If your blood sugar is less than 70 mg/dL, you will need to treat for low blood sugar by:  Treat a low blood sugar (less than 70 mg/dL) with 1/2 cup of clear juice (cranberry or apple), 4 glucose tablets, OR glucose gel.  Recheck blood sugar in 15 minutes after treatment (to make sure it is greater than 70 mg/dL).  If blood sugar is not greater than 70 mg/dL on re-check, call [phone] for further instructions.   Report your blood sugar to the Short-Stay nurse when you get to Short-Stay.  References:  [facility], 2007 "How to Manage your Diabetes Before and After Surgery".  What do I do about my diabetes medications?   Do not take oral diabetes medicines (pills) the morning of surgery.  THE NIGHT  BEFORE SURGERY, take 24 units of levemir Insulin.    THE MORNING OF SURGERY, take 15 units of levemir  Insulin.    Do not take other diabetes injectables the day of surgery including Byetta, Victoza, Bydureon, and Trulicity.    If your CBG is greater than 220 mg/dL, you may take 1/2 of your sliding scale (correction) dose of insulin.   For patients with "Insulin Pumps":  Contact your diabetes doctor for specific instructions before surgery.   Decrease basal insulin rates by 20% at midnight the night before surgery.  Note that if your surgery is planned to be longer than 2 hours, your insulin pump will be removed and intravenous (IV) insulin will be started and  managed by the nurses and anesthesiologist.  You will be able to restart your insulin pump once you are awake and able to manage it.  Make sure to bring insulin pump supplies to the hospital with you in case your site needs to be changed.

## 2015-03-01 ENCOUNTER — Encounter (HOSPITAL_COMMUNITY)
Admission: RE | Admit: 2015-03-01 | Discharge: 2015-03-01 | Disposition: A | Discharge status: Home / Self Care | Payer: Medicare HMO | Source: Ambulatory Visit | Attending: Neurosurgery | Admitting: Neurosurgery

## 2015-03-01 ENCOUNTER — Encounter (HOSPITAL_COMMUNITY): Payer: Self-pay

## 2015-03-01 DIAGNOSIS — Z7982 Long term (current) use of aspirin: Secondary | ICD-10-CM | POA: Diagnosis not present

## 2015-03-01 DIAGNOSIS — I10 Essential (primary) hypertension: Secondary | ICD-10-CM | POA: Insufficient documentation

## 2015-03-01 DIAGNOSIS — E119 Type 2 diabetes mellitus without complications: Secondary | ICD-10-CM | POA: Diagnosis not present

## 2015-03-01 DIAGNOSIS — Z7984 Long term (current) use of oral hypoglycemic drugs: Secondary | ICD-10-CM | POA: Diagnosis not present

## 2015-03-01 DIAGNOSIS — Z9221 Personal history of antineoplastic chemotherapy: Secondary | ICD-10-CM | POA: Insufficient documentation

## 2015-03-01 DIAGNOSIS — E785 Hyperlipidemia, unspecified: Secondary | ICD-10-CM | POA: Diagnosis not present

## 2015-03-01 DIAGNOSIS — Z8546 Personal history of malignant neoplasm of prostate: Secondary | ICD-10-CM | POA: Insufficient documentation

## 2015-03-01 DIAGNOSIS — I2510 Atherosclerotic heart disease of native coronary artery without angina pectoris: Secondary | ICD-10-CM | POA: Insufficient documentation

## 2015-03-01 DIAGNOSIS — Z01818 Encounter for other preprocedural examination: Secondary | ICD-10-CM | POA: Diagnosis present

## 2015-03-01 DIAGNOSIS — Z85038 Personal history of other malignant neoplasm of large intestine: Secondary | ICD-10-CM | POA: Diagnosis not present

## 2015-03-01 DIAGNOSIS — Z01812 Encounter for preprocedural laboratory examination: Secondary | ICD-10-CM | POA: Insufficient documentation

## 2015-03-01 DIAGNOSIS — I252 Old myocardial infarction: Secondary | ICD-10-CM | POA: Insufficient documentation

## 2015-03-01 DIAGNOSIS — Z923 Personal history of irradiation: Secondary | ICD-10-CM | POA: Diagnosis not present

## 2015-03-01 DIAGNOSIS — Z79899 Other long term (current) drug therapy: Secondary | ICD-10-CM | POA: Insufficient documentation

## 2015-03-01 DIAGNOSIS — M4802 Spinal stenosis, cervical region: Secondary | ICD-10-CM | POA: Diagnosis not present

## 2015-03-01 LAB — CBC WITH DIFFERENTIAL/PLATELET
Basophils Absolute: 0 10*3/uL (ref 0.0–0.1)
Basophils Relative: 0 %
EOS ABS: 0.2 10*3/uL (ref 0.0–0.7)
Eosinophils Relative: 3 %
HCT: 43 % (ref 39.0–52.0)
HEMOGLOBIN: 14.6 g/dL (ref 13.0–17.0)
LYMPHS ABS: 2.1 10*3/uL (ref 0.7–4.0)
LYMPHS PCT: 24 %
MCH: 29.4 pg (ref 26.0–34.0)
MCHC: 34 g/dL (ref 30.0–36.0)
MCV: 86.5 fL (ref 78.0–100.0)
Monocytes Absolute: 0.8 10*3/uL (ref 0.1–1.0)
Monocytes Relative: 9 %
NEUTROS PCT: 64 %
Neutro Abs: 5.8 10*3/uL (ref 1.7–7.7)
Platelets: 370 10*3/uL (ref 150–400)
RBC: 4.97 MIL/uL (ref 4.22–5.81)
RDW: 14.4 % (ref 11.5–15.5)
WBC: 9 10*3/uL (ref 4.0–10.5)

## 2015-03-01 LAB — BASIC METABOLIC PANEL
ANION GAP: 10 (ref 5–15)
BUN: 18 mg/dL (ref 6–20)
CHLORIDE: 106 mmol/L (ref 101–111)
CO2: 24 mmol/L (ref 22–32)
Calcium: 10.2 mg/dL (ref 8.9–10.3)
Creatinine, Ser: 1.28 mg/dL — ABNORMAL HIGH (ref 0.61–1.24)
GFR calc non Af Amer: 59 mL/min — ABNORMAL LOW (ref >60)
Glucose, Bld: 95 mg/dL (ref 65–99)
POTASSIUM: 4.5 mmol/L (ref 3.5–5.1)
SODIUM: 140 mmol/L (ref 135–145)

## 2015-03-01 LAB — GLUCOSE, CAPILLARY: GLUCOSE-CAPILLARY: 96 mg/dL (ref 65–99)

## 2015-03-01 LAB — SURGICAL PCR SCREEN
MRSA, PCR: NEGATIVE
STAPHYLOCOCCUS AUREUS: POSITIVE — AB

## 2015-03-01 NOTE — Progress Notes (Addendum)
Mr Feuerborn reports that his fasting CBG runs 1009.  Patient states that he had Labs and A1C drawn this month at PCP's office.  PCP is Dr Telford Nab in [place].  Patient does not know what the result was.   Patient 's cardiologist is Dr Bronson Ing in [place].  Patient has cardiac clearance from Dr Bronson Ing.  Patient denies chest pain of=r shortness of breath.  I faxed a request for labs and office notes to Dr Rhett Bannister office.

## 2015-03-01 NOTE — Pre-Procedure Instructions (Addendum)
LABIB CWYNAR  03/01/2015    Your procedure is scheduled on 03/05/15.  Report to [facility] Admitting at 6:00 A.M.              Your surgery is scheduled for 8:00 A.M.   Call this number if you have problems the morning of surgery: [phone]                           For any other questions, please call [phone], Monday - Friday 8 AM - 4 PM.   Remember:  Do not eat food or drink liquids after midnight Monday, December 5.   Take these medicines the morning of surgery with A SIP OF WATER neurontin, metoprolol.                 Stop taking Aspirin, Meloxicam.              May take Nitroglycerin if needed.  How to Manage Your Diabetes Before Surgery  What do I do about my diabetes medications?   Do not take oral diabetes medicines (pills) the morning of surgery.    THE NIGHT BEFORE SURGERY, take 24 units of Levemir Insulin.  Why is it important to control my blood sugar before and after surgery?   Improving blood sugar levels before and after surgery helps healing and can limit problems.  A way of improving blood sugar control is eating a healthy diet by:  - Eating less sugar and carbohydrates  - Increasing activity/exercise  - Talk with your doctor about reaching your blood sugar goals  High blood sugars (greater than 180 mg/dL) can raise your risk of infections and slow down your recovery so you will need to focus on controlling your diabetes during the weeks before surgery.  Make sure that the doctor who takes care of your diabetes knows about your planned surgery including the date and location.  How do I manage my blood sugars before surgery?   Check your blood sugar at least 4 times a day, 2 days before surgery to make sure that they are not too high or low.   Check your blood sugar the morning of your surgery when you wake up and every 2 hours until you get to the Short-Stay unit.  If your blood sugar is less than 70 mg/dL, you will need  to treat for low blood sugar by:  Treat a low blood sugar (less than 70 mg/dL) with 1/2 cup of clear juice (cranberry or apple), 4 glucose tablets, OR glucose gel.  Recheck blood sugar in 15 minutes after treatment (to make sure it is greater than 70 mg/dL).  If blood sugar is not greater than 70 mg/dL on re-check, call [phone] for further instructions.   Report your blood sugar to the Short-Stay nurse when you get to Short-Stay    Do not wear jewelry, make-up or nail polish.   Do not wear lotions, powders, or perfumes.      Men may shave face and neck.   Do not bring valuables to the hospital.   [facility] is not responsible for any belongings or valuables.  Contacts, dentures or bridgework may not be worn into surgery.  Leave your suitcase in the car.  After surgery it may be brought to your room.  For patients admitted to the hospital, discharge time will be determined by your treatment team.   Special instructions: Review  [facility] - Preparing For Surgery.   Please read over the following fact sheets that you were given.  Pain Booklet, Coughing and Deep Breathing, Blood Transfusion Information, MRSA Information and Surgical Site Infection Prevention How to Manage Your Diabetes Before Surgery

## 2015-03-01 NOTE — Progress Notes (Signed)
I called a prescription for Mupirocin ointment to [facility], [place], [place].

## 2015-03-04 MED ORDER — DEXAMETHASONE SODIUM PHOSPHATE 10 MG/ML IJ SOLN
10.0000 mg | INTRAMUSCULAR | Status: AC
  Administered 2015-03-05: 10 mg via INTRAVENOUS
  Filled 2015-03-04: qty 1

## 2015-03-04 MED ORDER — CEFAZOLIN SODIUM-DEXTROSE 2-3 GM-% IV SOLR
2.0000 g | INTRAVENOUS | Status: AC
  Administered 2015-03-05: 2 g via INTRAVENOUS
  Filled 2015-03-04: qty 50

## 2015-03-04 NOTE — Progress Notes (Signed)
Anesthesia Chart Review: Patient is a 59 year old male scheduled for C5-6 ACDF on 03/05/15/ by Dr. Annette Stable  History includes smoking, CAD/NSTEMI '07 s/p PCI CX, HTN, HLD, DM2, colon cancer s/p colon resection and chemotherapy, prostate cancer s/p radiation '14. PCP is Dr. Sherrie Sport.  Cardiologist is Dr. Bronson Ing who felt patient could proceed with surgery following recent echo.   Meds include ASA, benazepril, Neurontin, HCTZ, Levemir, metformin, Lopressor, Nitro, Zocor, Flomax.  01/25/15 EKG: SR, abnormal r wave progression, early transition, inferior infarct (old).  01/28/15 Echo: Study Conclusions - Left ventricle: The cavity size was normal. Wall thickness was increased in a pattern of mild LVH. Systolic function was vigorous. The estimated ejection fraction was in the range of 65% to 70%. Wall motion was normal; there were no regional wall motion abnormalities. Doppler parameters are consistent with abnormal left ventricular relaxation (grade 1 diastolic dysfunction).  06/14/05 LHC:  CONCLUSIONS: 1. Non-ST-elevation myocardial infarction with persistent pain with 95%  stenosis in proximal circumflex artery, no significant obstruction and  TIMI-2 flow, no significant obstruction in the LAD and right coronary  arteries but with TIMI-2 flow in both vessels, inferior and posterior  wall hypokinesis. 2. Successful stenting of the circumflex artery with improvement of central  narrowing from 95% to 0% and improvement in the flow from TIMI-2 to TIMI-  3 flow using a bare metal stent.  01/27/15 Carotid duplex: Summary: - Technically difficult due to high bifurcations. - Bilateral - 1% to 39% ICA stenosis. Unable to adequately evaluate  the right vertebral. The left vertebral artery flow is antegrade.  01/26/15 CXR: IMPRESSION: Mild bibasilar lung scarring. No edema or consolidation.  Preoperative labs noted. A1C 01/26/15 was 7.4.   If no acute  changes then I anticipate that he can proceed as planned.  George Hugh [facility] Short Stay Center/Anesthesiology Phone [phone] 03/04/2015 9:44 AM

## 2015-03-05 ENCOUNTER — Encounter (HOSPITAL_COMMUNITY)
Admission: RE | Disposition: A | Discharge status: Home / Self Care | Payer: Self-pay | Source: Ambulatory Visit | Attending: Neurosurgery

## 2015-03-05 ENCOUNTER — Inpatient Hospital Stay (HOSPITAL_COMMUNITY): Payer: Medicare HMO

## 2015-03-05 ENCOUNTER — Inpatient Hospital Stay (HOSPITAL_COMMUNITY): Payer: Medicare HMO | Admitting: Vascular Surgery

## 2015-03-05 ENCOUNTER — Inpatient Hospital Stay (HOSPITAL_COMMUNITY)
Admission: RE | Admit: 2015-03-05 | Discharge: 2015-03-06 | DRG: 472 | Disposition: A | Discharge status: Home / Self Care | Payer: Medicare HMO | Source: Ambulatory Visit | Attending: Neurosurgery | Admitting: Neurosurgery

## 2015-03-05 ENCOUNTER — Inpatient Hospital Stay (HOSPITAL_COMMUNITY): Payer: Medicare HMO | Admitting: Certified Registered Nurse Anesthetist

## 2015-03-05 ENCOUNTER — Encounter (HOSPITAL_COMMUNITY): Payer: Self-pay | Admitting: *Deleted

## 2015-03-05 DIAGNOSIS — G959 Disease of spinal cord, unspecified: Secondary | ICD-10-CM | POA: Diagnosis present

## 2015-03-05 DIAGNOSIS — Z794 Long term (current) use of insulin: Secondary | ICD-10-CM

## 2015-03-05 DIAGNOSIS — I2510 Atherosclerotic heart disease of native coronary artery without angina pectoris: Secondary | ICD-10-CM | POA: Diagnosis present

## 2015-03-05 DIAGNOSIS — I10 Essential (primary) hypertension: Secondary | ICD-10-CM | POA: Diagnosis present

## 2015-03-05 DIAGNOSIS — G9520 Unspecified cord compression: Secondary | ICD-10-CM | POA: Diagnosis present

## 2015-03-05 DIAGNOSIS — Z85038 Personal history of other malignant neoplasm of large intestine: Secondary | ICD-10-CM | POA: Diagnosis not present

## 2015-03-05 DIAGNOSIS — E119 Type 2 diabetes mellitus without complications: Secondary | ICD-10-CM | POA: Diagnosis present

## 2015-03-05 DIAGNOSIS — I252 Old myocardial infarction: Secondary | ICD-10-CM

## 2015-03-05 DIAGNOSIS — E785 Hyperlipidemia, unspecified: Secondary | ICD-10-CM | POA: Diagnosis present

## 2015-03-05 DIAGNOSIS — Z8546 Personal history of malignant neoplasm of prostate: Secondary | ICD-10-CM | POA: Diagnosis not present

## 2015-03-05 DIAGNOSIS — F17210 Nicotine dependence, cigarettes, uncomplicated: Secondary | ICD-10-CM | POA: Diagnosis present

## 2015-03-05 DIAGNOSIS — M4712 Other spondylosis with myelopathy, cervical region: Principal | ICD-10-CM | POA: Diagnosis present

## 2015-03-05 DIAGNOSIS — Z7982 Long term (current) use of aspirin: Secondary | ICD-10-CM

## 2015-03-05 DIAGNOSIS — M542 Cervicalgia: Secondary | ICD-10-CM | POA: Diagnosis present

## 2015-03-05 DIAGNOSIS — Z9221 Personal history of antineoplastic chemotherapy: Secondary | ICD-10-CM | POA: Diagnosis not present

## 2015-03-05 DIAGNOSIS — Z79899 Other long term (current) drug therapy: Secondary | ICD-10-CM

## 2015-03-05 DIAGNOSIS — Z955 Presence of coronary angioplasty implant and graft: Secondary | ICD-10-CM | POA: Diagnosis not present

## 2015-03-05 DIAGNOSIS — M4802 Spinal stenosis, cervical region: Secondary | ICD-10-CM | POA: Diagnosis present

## 2015-03-05 DIAGNOSIS — Z923 Personal history of irradiation: Secondary | ICD-10-CM | POA: Diagnosis not present

## 2015-03-05 DIAGNOSIS — Z7984 Long term (current) use of oral hypoglycemic drugs: Secondary | ICD-10-CM | POA: Diagnosis not present

## 2015-03-05 HISTORY — PX: ANTERIOR CERVICAL DECOMP/DISCECTOMY FUSION: SHX1161

## 2015-03-05 LAB — GLUCOSE, CAPILLARY
GLUCOSE-CAPILLARY: 135 mg/dL — AB (ref 65–99)
Glucose-Capillary: 120 mg/dL — ABNORMAL HIGH (ref 65–99)
Glucose-Capillary: 101 mg/dL — ABNORMAL HIGH (ref 65–99)
Glucose-Capillary: 174 mg/dL — ABNORMAL HIGH (ref 65–99)
Glucose-Capillary: 176 mg/dL — ABNORMAL HIGH (ref 65–99)

## 2015-03-05 SURGERY — ANTERIOR CERVICAL DECOMPRESSION/DISCECTOMY FUSION 1 LEVEL
Anesthesia: General | Site: Neck

## 2015-03-05 MED ORDER — ACETAMINOPHEN 325 MG PO TABS: 650.0000 mg | ORAL_TABLET | ORAL | Status: DC | PRN

## 2015-03-05 MED ORDER — MELOXICAM 7.5 MG PO TABS
7.5000 mg | ORAL_TABLET | Freq: Every day | ORAL | Status: DC
  Administered 2015-03-05 – 2015-03-06 (×2): 7.5 mg via ORAL
  Filled 2015-03-05 (×2): qty 1

## 2015-03-05 MED ORDER — NEOSTIGMINE METHYLSULFATE 10 MG/10ML IV SOLN
INTRAVENOUS | Status: DC | PRN
  Administered 2015-03-05: 4 mg via INTRAVENOUS

## 2015-03-05 MED ORDER — ROCURONIUM BROMIDE 100 MG/10ML IV SOLN
INTRAVENOUS | Status: DC | PRN
  Administered 2015-03-05: 50 mg via INTRAVENOUS

## 2015-03-05 MED ORDER — GABAPENTIN 100 MG PO CAPS
100.0000 mg | ORAL_CAPSULE | Freq: Three times a day (TID) | ORAL | Status: DC
  Administered 2015-03-05 – 2015-03-06 (×3): 100 mg via ORAL
  Filled 2015-03-05 (×3): qty 1

## 2015-03-05 MED ORDER — PHENOL 1.4 % MT LIQD: 1.0000 | OROMUCOSAL | Status: DC | PRN

## 2015-03-05 MED ORDER — CYCLOBENZAPRINE HCL 10 MG PO TABS
10.0000 mg | ORAL_TABLET | Freq: Three times a day (TID) | ORAL | Status: DC | PRN
  Administered 2015-03-05 (×2): 10 mg via ORAL
  Filled 2015-03-05: qty 1

## 2015-03-05 MED ORDER — HYDROMORPHONE HCL 1 MG/ML IJ SOLN
0.2500 mg | INTRAMUSCULAR | Status: DC | PRN
  Administered 2015-03-05 (×4): 0.5 mg via INTRAVENOUS

## 2015-03-05 MED ORDER — LACTATED RINGERS IV SOLN
INTRAVENOUS | Status: DC | PRN
  Administered 2015-03-05: 08:00:00 via INTRAVENOUS

## 2015-03-05 MED ORDER — INSULIN DETEMIR 100 UNIT/ML ~~LOC~~ SOLN
30.0000 [IU] | Freq: Every day | SUBCUTANEOUS | Status: DC
  Administered 2015-03-05: 30 [IU] via SUBCUTANEOUS
  Filled 2015-03-05 (×3): qty 0.3

## 2015-03-05 MED ORDER — 0.9 % SODIUM CHLORIDE (POUR BTL) OPTIME
TOPICAL | Status: DC | PRN
  Administered 2015-03-05: 1000 mL

## 2015-03-05 MED ORDER — SODIUM CHLORIDE 0.9 % IJ SOLN: 3.0000 mL | INTRAMUSCULAR | Status: DC | PRN

## 2015-03-05 MED ORDER — PROPOFOL 10 MG/ML IV BOLUS
INTRAVENOUS | Status: AC
  Filled 2015-03-05: qty 20

## 2015-03-05 MED ORDER — FENTANYL CITRATE (PF) 100 MCG/2ML IJ SOLN
INTRAMUSCULAR | Status: DC | PRN
  Administered 2015-03-05: 50 ug via INTRAVENOUS
  Administered 2015-03-05: 150 ug via INTRAVENOUS
  Administered 2015-03-05: 50 ug via INTRAVENOUS

## 2015-03-05 MED ORDER — PROPOFOL 10 MG/ML IV BOLUS
INTRAVENOUS | Status: DC | PRN
  Administered 2015-03-05: 120 mg via INTRAVENOUS
  Administered 2015-03-05: 20 mg via INTRAVENOUS

## 2015-03-05 MED ORDER — OXYCODONE-ACETAMINOPHEN 5-325 MG PO TABS
1.0000 | ORAL_TABLET | ORAL | Status: DC | PRN
  Administered 2015-03-05 – 2015-03-06 (×6): 2 via ORAL
  Filled 2015-03-05 (×5): qty 2

## 2015-03-05 MED ORDER — HYDROMORPHONE HCL 1 MG/ML IJ SOLN
INTRAMUSCULAR | Status: AC
  Filled 2015-03-05: qty 1

## 2015-03-05 MED ORDER — ACETAMINOPHEN 650 MG RE SUPP: 650.0000 mg | RECTAL | Status: DC | PRN

## 2015-03-05 MED ORDER — HYDROMORPHONE HCL 1 MG/ML IJ SOLN: 0.5000 mg | INTRAMUSCULAR | Status: DC | PRN

## 2015-03-05 MED ORDER — HYDROCHLOROTHIAZIDE 25 MG PO TABS
25.0000 mg | ORAL_TABLET | Freq: Every day | ORAL | Status: DC
  Administered 2015-03-05 – 2015-03-06 (×2): 25 mg via ORAL
  Filled 2015-03-05 (×2): qty 1

## 2015-03-05 MED ORDER — METFORMIN HCL 500 MG PO TABS
1000.0000 mg | ORAL_TABLET | Freq: Two times a day (BID) | ORAL | Status: DC
  Administered 2015-03-05 – 2015-03-06 (×2): 1000 mg via ORAL
  Filled 2015-03-05 (×2): qty 2

## 2015-03-05 MED ORDER — CYCLOBENZAPRINE HCL 10 MG PO TABS
ORAL_TABLET | ORAL | Status: AC
  Filled 2015-03-05: qty 1

## 2015-03-05 MED ORDER — OXYCODONE HCL 5 MG/5ML PO SOLN: 5.0000 mg | Freq: Once | ORAL | Status: DC | PRN

## 2015-03-05 MED ORDER — FENTANYL CITRATE (PF) 250 MCG/5ML IJ SOLN
INTRAMUSCULAR | Status: AC
  Filled 2015-03-05: qty 5

## 2015-03-05 MED ORDER — THROMBIN 5000 UNITS EX SOLR
OROMUCOSAL | Status: DC | PRN
  Administered 2015-03-05: 10 mL via TOPICAL

## 2015-03-05 MED ORDER — FENOFIBRATE 54 MG PO TABS
108.0000 mg | ORAL_TABLET | Freq: Every day | ORAL | Status: DC
Start: 2015-03-05 — End: 2015-03-06
  Administered 2015-03-05 – 2015-03-06 (×2): 108 mg via ORAL
  Filled 2015-03-05 (×2): qty 2

## 2015-03-05 MED ORDER — BENAZEPRIL HCL 20 MG PO TABS
20.0000 mg | ORAL_TABLET | Freq: Every day | ORAL | Status: DC
  Administered 2015-03-05 – 2015-03-06 (×2): 20 mg via ORAL
  Filled 2015-03-05 (×2): qty 1

## 2015-03-05 MED ORDER — MIDAZOLAM HCL 2 MG/2ML IJ SOLN
INTRAMUSCULAR | Status: AC
  Filled 2015-03-05: qty 2

## 2015-03-05 MED ORDER — NITROGLYCERIN 0.4 MG SL SUBL: 0.4000 mg | SUBLINGUAL_TABLET | SUBLINGUAL | Status: DC | PRN

## 2015-03-05 MED ORDER — MIDAZOLAM HCL 5 MG/5ML IJ SOLN
INTRAMUSCULAR | Status: DC | PRN
  Administered 2015-03-05: 2 mg via INTRAVENOUS

## 2015-03-05 MED ORDER — THROMBIN 5000 UNITS EX SOLR
CUTANEOUS | Status: DC | PRN
  Administered 2015-03-05 (×2): 5000 [IU] via TOPICAL

## 2015-03-05 MED ORDER — OXYCODONE-ACETAMINOPHEN 5-325 MG PO TABS
ORAL_TABLET | ORAL | Status: AC
  Filled 2015-03-05: qty 2

## 2015-03-05 MED ORDER — METOPROLOL TARTRATE 12.5 MG HALF TABLET
12.5000 mg | ORAL_TABLET | Freq: Two times a day (BID) | ORAL | Status: DC
  Administered 2015-03-05 – 2015-03-06 (×2): 12.5 mg via ORAL
  Filled 2015-03-05 (×2): qty 1

## 2015-03-05 MED ORDER — TAMSULOSIN HCL 0.4 MG PO CAPS
0.4000 mg | ORAL_CAPSULE | Freq: Every day | ORAL | Status: DC
  Administered 2015-03-05: 0.4 mg via ORAL
  Filled 2015-03-05: qty 1

## 2015-03-05 MED ORDER — ONDANSETRON HCL 4 MG/2ML IJ SOLN: 4.0000 mg | INTRAMUSCULAR | Status: DC | PRN

## 2015-03-05 MED ORDER — HEMOSTATIC AGENTS (NO CHARGE) OPTIME
TOPICAL | Status: DC | PRN
  Administered 2015-03-05: 1 via TOPICAL

## 2015-03-05 MED ORDER — ASPIRIN 325 MG PO TABS
325.0000 mg | ORAL_TABLET | Freq: Every day | ORAL | Status: DC
  Administered 2015-03-05 – 2015-03-06 (×2): 325 mg via ORAL
  Filled 2015-03-05 (×2): qty 1

## 2015-03-05 MED ORDER — ATORVASTATIN CALCIUM 20 MG PO TABS
40.0000 mg | ORAL_TABLET | Freq: Every day | ORAL | Status: DC
  Administered 2015-03-05: 40 mg via ORAL
  Filled 2015-03-05: qty 2

## 2015-03-05 MED ORDER — ACETAMINOPHEN 160 MG/5ML PO SOLN: 325.0000 mg | ORAL | Status: DC | PRN

## 2015-03-05 MED ORDER — ACETAMINOPHEN 325 MG PO TABS: 325.0000 mg | ORAL_TABLET | ORAL | Status: DC | PRN

## 2015-03-05 MED ORDER — ONDANSETRON HCL 4 MG/2ML IJ SOLN
INTRAMUSCULAR | Status: DC | PRN
  Administered 2015-03-05: 4 mg via INTRAVENOUS

## 2015-03-05 MED ORDER — HYDROCODONE-ACETAMINOPHEN 5-325 MG PO TABS: 1.0000 | ORAL_TABLET | ORAL | Status: DC | PRN

## 2015-03-05 MED ORDER — CEFAZOLIN SODIUM 1-5 GM-% IV SOLN
1.0000 g | Freq: Three times a day (TID) | INTRAVENOUS | Status: AC
  Administered 2015-03-05 (×2): 1 g via INTRAVENOUS
  Filled 2015-03-05 (×2): qty 50

## 2015-03-05 MED ORDER — INSULIN ASPART 100 UNIT/ML ~~LOC~~ SOLN
0.0000 [IU] | Freq: Three times a day (TID) | SUBCUTANEOUS | Status: DC
  Administered 2015-03-05: 4 [IU] via SUBCUTANEOUS

## 2015-03-05 MED ORDER — GLYCOPYRROLATE 0.2 MG/ML IJ SOLN
INTRAMUSCULAR | Status: DC | PRN
  Administered 2015-03-05: .6 mg via INTRAVENOUS

## 2015-03-05 MED ORDER — LIDOCAINE HCL (CARDIAC) 20 MG/ML IV SOLN
INTRAVENOUS | Status: DC | PRN
  Administered 2015-03-05: 70 mg via INTRAVENOUS

## 2015-03-05 MED ORDER — SODIUM CHLORIDE 0.9 % IR SOLN
Status: DC | PRN
  Administered 2015-03-05: 500 mL

## 2015-03-05 MED ORDER — SODIUM CHLORIDE 0.9 % IJ SOLN
3.0000 mL | Freq: Two times a day (BID) | INTRAMUSCULAR | Status: DC
  Administered 2015-03-05 (×2): 3 mL via INTRAVENOUS

## 2015-03-05 MED ORDER — MENTHOL 3 MG MT LOZG: 1.0000 | LOZENGE | OROMUCOSAL | Status: DC | PRN

## 2015-03-05 MED ORDER — OXYCODONE HCL 5 MG PO TABS: 5.0000 mg | ORAL_TABLET | Freq: Once | ORAL | Status: DC | PRN

## 2015-03-05 SURGICAL SUPPLY — 52 items
BAG DECANTER FOR FLEXI CONT (MISCELLANEOUS) ×3 IMPLANT
BENZOIN TINCTURE PRP APPL 2/3 (GAUZE/BANDAGES/DRESSINGS) ×3 IMPLANT
BIT DRILL 13 (BIT) ×2 IMPLANT
BIT DRILL 13MM (BIT) ×1
BRUSH SCRUB EZ PLAIN DRY (MISCELLANEOUS) ×3 IMPLANT
BUR MATCHSTICK NEURO 3.0 LAGG (BURR) ×3 IMPLANT
CANISTER SUCT 3000ML PPV (MISCELLANEOUS) ×3 IMPLANT
CLOSURE WOUND 1/2 X4 (GAUZE/BANDAGES/DRESSINGS) ×1
DRAPE C-ARM 42X72 X-RAY (DRAPES) ×6 IMPLANT
DRAPE LAPAROTOMY 100X72 PEDS (DRAPES) ×3 IMPLANT
DRAPE MICROSCOPE LEICA (MISCELLANEOUS) ×3 IMPLANT
DRAPE POUCH INSTRU U-SHP 10X18 (DRAPES) ×3 IMPLANT
DRSG OPSITE POSTOP 3X4 (GAUZE/BANDAGES/DRESSINGS) ×3 IMPLANT
DURAPREP 6ML APPLICATOR 50/CS (WOUND CARE) ×3 IMPLANT
ELECT COATED BLADE 2.86 ST (ELECTRODE) ×3 IMPLANT
ELECT REM PT RETURN 9FT ADLT (ELECTROSURGICAL) ×3
ELECTRODE REM PT RTRN 9FT ADLT (ELECTROSURGICAL) ×1 IMPLANT
GAUZE SPONGE 4X4 12PLY STRL (GAUZE/BANDAGES/DRESSINGS) ×3 IMPLANT
GAUZE SPONGE 4X4 16PLY XRAY LF (GAUZE/BANDAGES/DRESSINGS) IMPLANT
GLOVE ECLIPSE 9.0 STRL (GLOVE) ×3 IMPLANT
GLOVE EXAM NITRILE LRG STRL (GLOVE) IMPLANT
GLOVE EXAM NITRILE MD LF STRL (GLOVE) IMPLANT
GLOVE EXAM NITRILE XL STR (GLOVE) IMPLANT
GLOVE EXAM NITRILE XS STR PU (GLOVE) IMPLANT
GOWN STRL REUS W/ TWL LRG LVL3 (GOWN DISPOSABLE) IMPLANT
GOWN STRL REUS W/ TWL XL LVL3 (GOWN DISPOSABLE) IMPLANT
GOWN STRL REUS W/TWL 2XL LVL3 (GOWN DISPOSABLE) IMPLANT
GOWN STRL REUS W/TWL LRG LVL3 (GOWN DISPOSABLE)
GOWN STRL REUS W/TWL XL LVL3 (GOWN DISPOSABLE)
HALTER HD/CHIN CERV TRACTION D (MISCELLANEOUS) ×3 IMPLANT
KIT BASIN OR (CUSTOM PROCEDURE TRAY) ×3 IMPLANT
KIT ROOM TURNOVER OR (KITS) ×3 IMPLANT
NEEDLE SPNL 20GX3.5 QUINCKE YW (NEEDLE) ×3 IMPLANT
NS IRRIG 1000ML POUR BTL (IV SOLUTION) ×3 IMPLANT
PACK LAMINECTOMY NEURO (CUSTOM PROCEDURE TRAY) ×3 IMPLANT
PAD ARMBOARD 7.5X6 YLW CONV (MISCELLANEOUS) ×9 IMPLANT
PEEK CAGE 8X14X11 (Peek) ×3 IMPLANT
PLATE ELITE VISION 25MM (Plate) ×3 IMPLANT
RUBBERBAND STERILE (MISCELLANEOUS) ×6 IMPLANT
SCREW ST 13X4XST VA NS SPNE (Screw) ×4 IMPLANT
SCREW ST VAR 4 ATL (Screw) ×8 IMPLANT
SPONGE INTESTINAL PEANUT (DISPOSABLE) ×3 IMPLANT
SPONGE SURGIFOAM ABS GEL SZ50 (HEMOSTASIS) ×3 IMPLANT
STRIP CLOSURE SKIN 1/2X4 (GAUZE/BANDAGES/DRESSINGS) ×2 IMPLANT
SUT VIC AB 3-0 SH 8-18 (SUTURE) ×3 IMPLANT
SUT VIC AB 4-0 RB1 18 (SUTURE) ×3 IMPLANT
TAPE CLOTH 4X10 WHT NS (GAUZE/BANDAGES/DRESSINGS) ×3 IMPLANT
TAPE STRIPS DRAPE STRL (GAUZE/BANDAGES/DRESSINGS) ×3 IMPLANT
TOWEL OR 17X24 6PK STRL BLUE (TOWEL DISPOSABLE) ×3 IMPLANT
TOWEL OR 17X26 10 PK STRL BLUE (TOWEL DISPOSABLE) ×3 IMPLANT
TRAP SPECIMEN MUCOUS 40CC (MISCELLANEOUS) ×3 IMPLANT
WATER STERILE IRR 1000ML POUR (IV SOLUTION) ×3 IMPLANT

## 2015-03-05 NOTE — Op Note (Signed)
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  Asst. Surgeon: None  Anesthesia: General  Indication: 59 year old male with neck pain and associated bilateral upper extremity weakness and sensory loss consistent with progressive cervical myelopathy. Workup demonstrates evidence of marked multilevel degenerative change with severe stenosis at C3-4 with resultant spinal cord compression and spinal cord signal abnormality. Patient presents now for C3-4 anterior cervical decompression and fusion in hopes of improving his symptoms.  Operative note: After induction of anesthesia, patient positioned supine with neck slightly extended and held in place with halter traction. Anterior cervical region prepped and draped sterilely. Incision made overlying C3-4. Dissection performed on the right side. Retractor placed. Fluoroscopy used. Levels confirmed. Discectomy then performed using a 15 blade first incised the disc and then various curettes and rongeurs to remove disc material down to level of the posterior annulus. Microscope brought into the field used throughout the remainder of the discectomy. Remaining aspects of annulus and osteophytes removed down to level of the posterior longitudinal ligament. Posterior longitudinal limb was elevated and resected in a piecemeal fashion. Underlying thecal sac was then identified. A wide central decompression was then performed by undercutting the bodies of C3 and C4. Decompression then proceeded into each neural foramen. Wide anterior foraminotomies were performed on course exiting C4 nerve roots bilaterally. At this point a very thorough decompression had  been achieved. There was no evidence of injury to thecal sac or nerve roots. An 8 mm Medtronic anatomic peek cage was packed in the interspace are packed with locally harvested autograft and then impacted in place. This was recessed slightly from the anterior cortical margin. 25 mm Atlantis cervical plate was then placed or the C3 and C4 levels. This was attached using 13 mm variable-angle screws 2 each at both levels. All screws given a final tightening and found to be solidly within the bone. Locking screws were engaged at both levels. Final images revealed good position the bone graft and hardware at proper upper level with normal alignment of the spine. Wound is then irrigated one final time. Hemostasis was achieved with bipolar chart. Wounds and close in layers with Vicryl sutures. Steri-Strips and sterile dressing were applied. No apparent competitions. Patient tolerated the procedure well and he returns to the recovery room postop.

## 2015-03-05 NOTE — Brief Op Note (Signed)
03/05/2015  9:14 AM  PATIENT:  [name]  59 y.o. male  PRE-OPERATIVE DIAGNOSIS:  Stenosis  POST-OPERATIVE DIAGNOSIS:  Stenosis  PROCEDURE:  Procedure(s) with comments: ANTERIOR CERVICAL DECOMPRESSION/DISCECTOMY FUSION 1 LEVEL (N/A) - ANTERIOR CERVICAL DECOMPRESSION/DISCECTOMY FUSION 1 LEVEL CERVICAL THREE-FOUR  SURGEON:  Surgeon(s) and Role:    * Earnie Larsson, MD - Primary  PHYSICIAN ASSISTANT:   ASSISTANTS:    ANESTHESIA:   general  EBL:  Total I/O In: -  Out: 15 [Blood:15]  BLOOD ADMINISTERED:none  DRAINS: none   LOCAL MEDICATIONS USED:  NONE  SPECIMEN:  No Specimen  DISPOSITION OF SPECIMEN:  N/A  COUNTS:  YES  TOURNIQUET:  * No tourniquets in log *  DICTATION: .Dragon Dictation  PLAN OF CARE: Admit to inpatient   PATIENT DISPOSITION:  PACU - hemodynamically stable.   Delay start of Pharmacological VTE agent (>24hrs) due to surgical blood loss or risk of bleeding: yes

## 2015-03-05 NOTE — Anesthesia Procedure Notes (Addendum)
Procedure Name: Intubation Date/Time: 03/05/2015 8:04 AM Performed by: Tressia Miners LEFFEW Pre-anesthesia Checklist: Patient identified, Patient being monitored, Timeout performed, Emergency Drugs available and Suction available Patient Re-evaluated:Patient Re-evaluated prior to inductionOxygen Delivery Method: Circle System Utilized Preoxygenation: Pre-oxygenation with 100% oxygen Intubation Type: IV induction Ventilation: Mask ventilation without difficulty and Oral airway inserted - appropriate to patient size Grade View: Grade II Tube type: Oral Tube size: 7.0 mm Number of attempts: 1 Airway Equipment and Method: Stylet and Video-laryngoscopy Placement Confirmation: positive ETCO2 and breath sounds checked- equal and bilateral Secured at: 22 cm Tube secured with: Tape Dental Injury: Teeth and Oropharynx as per pre-operative assessment
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
## 2015-03-05 NOTE — Anesthesia Preprocedure Evaluation (Signed)
Anesthesia Evaluation  Patient identified by MRN, date of birth, ID band Patient awake    Reviewed: Allergy & Precautions, NPO status , Patient's Chart, lab work & pertinent test results, reviewed documented beta blocker date and time   History of Anesthesia Complications Negative for: history of anesthetic complications  Airway Mallampati: II  TM Distance: >3 FB Neck ROM: Full    Dental  (+) Edentulous Upper   Pulmonary neg shortness of breath, neg sleep apnea, neg COPD, neg recent URI, Current Smoker,    breath sounds clear to auscultation       Cardiovascular hypertension, Pt. on medications and Pt. on home beta blockers + CAD, + Past MI and + Cardiac Stents  (-) CHF (-) dysrhythmias  Rhythm:Regular     Neuro/Psych negative neurological ROS  negative psych ROS   GI/Hepatic negative GI ROS, Neg liver ROS,   Endo/Other  diabetes, Type 2, Insulin Dependent  Renal/GU negative Renal ROS     Musculoskeletal   Abdominal   Peds  Hematology   Anesthesia Other Findings   Reproductive/Obstetrics                             Anesthesia Physical Anesthesia Plan  ASA: III  Anesthesia Plan: General   Post-op Pain Management:    Induction: Intravenous  Airway Management Planned: Oral ETT  Additional Equipment: None  Intra-op Plan:   Post-operative Plan: Extubation in OR  Informed Consent: I have reviewed the patients History and Physical, chart, labs and discussed the procedure including the risks, benefits and alternatives for the proposed anesthesia with the patient or authorized representative who has indicated his/her understanding and acceptance.   Dental advisory given  Plan Discussed with: CRNA and Surgeon  Anesthesia Plan Comments:         Anesthesia Quick Evaluation

## 2015-03-05 NOTE — Transfer of Care (Signed)
Immediate Anesthesia Transfer of Care Note  Patient: Cameron Chandler  Procedure(s) Performed: Procedure(s) with comments: ANTERIOR CERVICAL DECOMPRESSION/DISCECTOMY FUSION 1 LEVEL (N/A) - ANTERIOR CERVICAL DECOMPRESSION/DISCECTOMY FUSION 1 LEVEL CERVICAL THREE-FOUR  Patient Location: PACU  Anesthesia Type:General  Level of Consciousness: awake, alert , patient cooperative and responds to stimulation  Airway & Oxygen Therapy: Patient Spontanous Breathing and Patient connected to nasal cannula oxygen  Post-op Assessment: Report given to RN, Post -op Vital signs reviewed and stable and Patient moving all extremities X 4  Post vital signs: Reviewed and stable  Last Vitals:  Filed Vitals:   03/05/15 0700  BP: 125/83  Pulse: 64  Temp: 25.0 C    Complications: No apparent anesthesia complications

## 2015-03-05 NOTE — Progress Notes (Signed)
Utilization review completed.  

## 2015-03-05 NOTE — Anesthesia Postprocedure Evaluation (Signed)
Anesthesia Post Note  Patient: Cameron Chandler  Procedure(s) Performed: Procedure(s) (LRB): ANTERIOR CERVICAL DECOMPRESSION/DISCECTOMY FUSION 1 LEVEL (N/A)  Patient location during evaluation: PACU Anesthesia Type: General Level of consciousness: awake Pain management: pain level controlled Vital Signs Assessment: post-procedure vital signs reviewed and stable Respiratory status: spontaneous breathing Cardiovascular status: stable Postop Assessment: no signs of nausea or vomiting Anesthetic complications: no    Last Vitals:  Filed Vitals:   03/05/15 1250 03/05/15 1545  BP: 138/93 144/94  Pulse: 73 82  Temp: 36.4 C 36.6 C  Resp: 18 20    Last Pain:  Filed Vitals:   03/05/15 1554  PainSc: 7                  Jermeka Schlotterbeck

## 2015-03-06 LAB — GLUCOSE, CAPILLARY
GLUCOSE-CAPILLARY: 116 mg/dL — AB (ref 65–99)
Glucose-Capillary: 94 mg/dL (ref 65–99)

## 2015-03-06 MED ORDER — CYCLOBENZAPRINE HCL 10 MG PO TABS: 10.0000 mg | ORAL_TABLET | Freq: Three times a day (TID) | ORAL | Status: DC | PRN

## 2015-03-06 MED ORDER — HYDROCODONE-ACETAMINOPHEN 5-325 MG PO TABS: 1.0000 | ORAL_TABLET | ORAL | Status: DC | PRN

## 2015-03-06 NOTE — Discharge Instructions (Signed)

## 2015-03-06 NOTE — Progress Notes (Signed)
Patient alert and oriented, mae's well, voiding adequate amount of urine, swallowing without difficulty, no c/o pain. Patient discharged home with family. Script and discharged instructions given to patient. Patient and family stated understanding of d/c instructions given and has an appointment with MD. 

## 2015-03-06 NOTE — Discharge Summary (Addendum)
Physician Discharge Summary  Patient ID: Cameron Chandler MRN: [id number] DOB/AGE: [birth year] 59 y.o.  Admit date: 03/05/2015 Discharge date: 03/06/2015  Admission Diagnoses:  Discharge Diagnoses:  Principal Problem:   Spondylosis, cervical, with myelopathy Active Problems:   Cervical myelopathy [facility])   Discharged Condition: good  Hospital Course: ` The patient was admitted to the hospital where he underwent an uncomplicated J5-0 anterior cervical decompression and fusion. Postoperative she is done well. Preoperative neck and upper extremity pain and weakness much improved. Ambulating without difficulty. Ready for discharge home.  Consults:   Significant Diagnostic Studies:   Treatments:   Discharge Exam: Blood pressure 120/71, pulse 69, temperature 97.5 F (36.4 C), temperature source Oral, resp. rate 18, height '6\' 6"'$  (1.981 m), weight 89.495 kg (197 lb 4.8 oz), SpO2 98 %. the patient is awake and alert. He is oriented and appropriate. Speech is fluent. His motor and sensory examination are improved from preop. Still has a little bit of intrinsic weakness in his left upper extremity but this is significantly better than preop. Wound clean and dry. Chest and abdomen benign. Disposition: 01-Home or Self Care     Medication List    TAKE these medications        aspirin 325 MG tablet  Take 325 mg by mouth daily.     benazepril 20 MG tablet  Commonly known as:  LOTENSIN  Take 1 tablet (20 mg total) by mouth daily.     cyanocobalamin 1000 MCG/ML injection  Commonly known as:  (VITAMIN B-12)  1000 g subcutaneously daily for 6 more days, and then once a month after that.     cyclobenzaprine 10 MG tablet  Commonly known as:  FLEXERIL  Take 1 tablet (10 mg total) by mouth 3 (three) times daily as needed for muscle spasms.     Fenofibric Acid 105 MG Tabs  Take 105 mg by mouth daily.     gabapentin 100 MG capsule  Commonly known as:  NEURONTIN  Take 1 capsule (100 mg  total) by mouth 3 (three) times daily.     hydrochlorothiazide 25 MG tablet  Commonly known as:  HYDRODIURIL  Take 25 mg by mouth daily.     HYDROcodone-acetaminophen 5-325 MG tablet  Commonly known as:  NORCO/VICODIN  Take 1-2 tablets by mouth every 4 (four) hours as needed (mild pain).     insulin detemir 100 UNIT/ML injection  Commonly known as:  LEVEMIR  Inject 30 Units into the skin at bedtime.     meloxicam 7.5 MG tablet  Commonly known as:  MOBIC  Take 7.5 mg by mouth daily.     metFORMIN 1000 MG tablet  Commonly known as:  GLUCOPHAGE  Take 1,000 mg by mouth 2 (two) times daily with a meal.     metoprolol tartrate 25 MG tablet  Commonly known as:  LOPRESSOR  Take 0.5 tablets (12.5 mg total) by mouth 2 (two) times daily.     nitroGLYCERIN 0.4 MG SL tablet  Commonly known as:  NITROSTAT  Place 1 tablet (0.4 mg total) under the tongue every 5 (five) minutes as needed.     simvastatin 80 MG tablet  Commonly known as:  ZOCOR  TAKE 1 TABLET BY MOUTH EVERY DAY --- PATIENT NEEDS TO SCHEDULE AN OFFICE VISIT FOR FURTHER REFILLS     tamsulosin 0.4 MG Caps capsule  Commonly known as:  FLOMAX  Take 0.4 mg by mouth daily after supper.  Follow-up Information    Follow up with Charlie Pitter, MD.   Specialty:  Neurosurgery   Contact information:   [address] [place] [address] [phone]       Signed: Charlie Pitter 03/06/2015, 10:13 AM

## 2015-03-07 ENCOUNTER — Encounter (HOSPITAL_COMMUNITY): Payer: Self-pay | Admitting: Neurosurgery

## 2015-03-25 ENCOUNTER — Other Ambulatory Visit: Payer: Self-pay | Admitting: Cardiovascular Disease

## 2015-04-22 ENCOUNTER — Other Ambulatory Visit: Payer: Self-pay | Admitting: Cardiovascular Disease

## 2015-08-20 ENCOUNTER — Other Ambulatory Visit: Payer: Self-pay | Admitting: Cardiovascular Disease

## 2015-11-15 ENCOUNTER — Other Ambulatory Visit (HOSPITAL_COMMUNITY): Payer: Self-pay | Admitting: Internal Medicine

## 2015-11-15 DIAGNOSIS — R918 Other nonspecific abnormal finding of lung field: Secondary | ICD-10-CM

## 2015-11-20 ENCOUNTER — Ambulatory Visit (HOSPITAL_COMMUNITY)
Admission: RE | Admit: 2015-11-20 | Discharge: 2015-11-20 | Disposition: A | Discharge status: Home / Self Care | Payer: Medicare HMO | Source: Ambulatory Visit | Attending: Internal Medicine | Admitting: Internal Medicine

## 2015-11-20 DIAGNOSIS — R918 Other nonspecific abnormal finding of lung field: Secondary | ICD-10-CM | POA: Insufficient documentation

## 2015-11-20 DIAGNOSIS — R590 Localized enlarged lymph nodes: Secondary | ICD-10-CM | POA: Diagnosis not present

## 2015-11-20 DIAGNOSIS — M899 Disorder of bone, unspecified: Secondary | ICD-10-CM | POA: Diagnosis not present

## 2015-11-20 DIAGNOSIS — R188 Other ascites: Secondary | ICD-10-CM | POA: Insufficient documentation

## 2015-11-20 DIAGNOSIS — J90 Pleural effusion, not elsewhere classified: Secondary | ICD-10-CM | POA: Diagnosis not present

## 2015-11-20 LAB — GLUCOSE, CAPILLARY: GLUCOSE-CAPILLARY: 101 mg/dL — AB (ref 65–99)

## 2015-11-20 MED ORDER — FLUDEOXYGLUCOSE F - 18 (FDG) INJECTION
9.7000 | Freq: Once | INTRAVENOUS | Status: AC | PRN
  Administered 2015-11-20: 9.7 via INTRAVENOUS

## 2015-11-21 ENCOUNTER — Ambulatory Visit (HOSPITAL_COMMUNITY)
Admission: RE | Admit: 2015-11-21 | Discharge: 2015-11-21 | Disposition: A | Discharge status: Home / Self Care | Payer: Medicare HMO | Source: Ambulatory Visit | Attending: Radiology | Admitting: Radiology

## 2015-11-21 ENCOUNTER — Ambulatory Visit (INDEPENDENT_AMBULATORY_CARE_PROVIDER_SITE_OTHER): Payer: Medicare HMO | Admitting: Internal Medicine

## 2015-11-21 ENCOUNTER — Other Ambulatory Visit (INDEPENDENT_AMBULATORY_CARE_PROVIDER_SITE_OTHER): Payer: Medicare HMO

## 2015-11-21 ENCOUNTER — Ambulatory Visit (HOSPITAL_COMMUNITY)
Admission: RE | Admit: 2015-11-21 | Discharge: 2015-11-21 | Disposition: A | Discharge status: Home / Self Care | Payer: Medicare HMO | Source: Ambulatory Visit | Attending: Internal Medicine | Admitting: Internal Medicine

## 2015-11-21 ENCOUNTER — Encounter: Payer: Self-pay | Admitting: Internal Medicine

## 2015-11-21 DIAGNOSIS — E039 Hypothyroidism, unspecified: Secondary | ICD-10-CM

## 2015-11-21 DIAGNOSIS — J90 Pleural effusion, not elsewhere classified: Secondary | ICD-10-CM | POA: Insufficient documentation

## 2015-11-21 DIAGNOSIS — C782 Secondary malignant neoplasm of pleura: Secondary | ICD-10-CM | POA: Insufficient documentation

## 2015-11-21 DIAGNOSIS — F17210 Nicotine dependence, cigarettes, uncomplicated: Secondary | ICD-10-CM | POA: Diagnosis not present

## 2015-11-21 DIAGNOSIS — Z9889 Other specified postprocedural states: Secondary | ICD-10-CM | POA: Diagnosis not present

## 2015-11-21 DIAGNOSIS — I10 Essential (primary) hypertension: Secondary | ICD-10-CM

## 2015-11-21 DIAGNOSIS — R918 Other nonspecific abnormal finding of lung field: Secondary | ICD-10-CM | POA: Insufficient documentation

## 2015-11-21 DIAGNOSIS — R0600 Dyspnea, unspecified: Secondary | ICD-10-CM

## 2015-11-21 LAB — PROTEIN, BODY FLUID

## 2015-11-21 LAB — BASIC METABOLIC PANEL
BUN: 12 mg/dL (ref 6–23)
CHLORIDE: 107 meq/L (ref 96–112)
CO2: 28 meq/L (ref 19–32)
CREATININE: 0.77 mg/dL (ref 0.40–1.50)
Calcium: 8.2 mg/dL — ABNORMAL LOW (ref 8.4–10.5)
GFR: 131.46 mL/min (ref >60.00)
GLUCOSE: 86 mg/dL (ref 70–99)
Potassium: 4.5 mEq/L (ref 3.5–5.1)
Sodium: 140 mEq/L (ref 135–145)

## 2015-11-21 LAB — BODY FLUID CELL COUNT WITH DIFFERENTIAL
Eos, Fluid: 0 %
LYMPHS FL: 50 %
MONOCYTE-MACROPHAGE-SEROUS FLUID: 30 % — AB (ref 50–90)
Neutrophil Count, Fluid: 20 % (ref 0–25)
Total Nucleated Cell Count, Fluid: 700 cu mm (ref 0–1000)

## 2015-11-21 LAB — CBC WITH DIFFERENTIAL/PLATELET
BASOS PCT: 0.3 % (ref 0.0–3.0)
Basophils Absolute: 0 10*3/uL (ref 0.0–0.1)
EOS PCT: 1.9 % (ref 0.0–5.0)
Eosinophils Absolute: 0.2 10*3/uL (ref 0.0–0.7)
HCT: 44 % (ref 39.0–52.0)
Hemoglobin: 14.4 g/dL (ref 13.0–17.0)
LYMPHS ABS: 1 10*3/uL (ref 0.7–4.0)
Lymphocytes Relative: 9.7 % — ABNORMAL LOW (ref 12.0–46.0)
MCHC: 32.6 g/dL (ref 30.0–36.0)
MCV: 80.8 fl (ref 78.0–100.0)
MONO ABS: 1 10*3/uL (ref 0.1–1.0)
Monocytes Relative: 8.9 % (ref 3.0–12.0)
NEUTROS ABS: 8.4 10*3/uL — AB (ref 1.4–7.7)
Neutrophils Relative %: 79.2 % — ABNORMAL HIGH (ref 43.0–77.0)
Platelets: 453 10*3/uL — ABNORMAL HIGH (ref 150.0–400.0)
RBC: 5.44 Mil/uL (ref 4.22–5.81)
RDW: 16.7 % — AB (ref 11.5–15.5)
WBC: 10.6 10*3/uL — ABNORMAL HIGH (ref 4.0–10.5)

## 2015-11-21 LAB — GLUCOSE, SEROUS FLUID: GLUCOSE FL: 71 mg/dL

## 2015-11-21 LAB — LACTATE DEHYDROGENASE, PLEURAL OR PERITONEAL FLUID: LD, Fluid: 200 U/L — ABNORMAL HIGH (ref 3–23)

## 2015-11-21 LAB — TSH: TSH: 5.9 u[IU]/mL — AB (ref 0.35–4.50)

## 2015-11-21 LAB — BRAIN NATRIURETIC PEPTIDE: Pro B Natriuretic peptide (BNP): 25 pg/mL (ref 0.0–100.0)

## 2015-11-21 MED ORDER — VALSARTAN 160 MG PO TABS: 160.0000 mg | ORAL_TABLET | Freq: Every day | ORAL | 11 refills | Status: DC

## 2015-11-21 NOTE — Procedures (Signed)
Ultrasound-guided diagnostic and therapeutic right thoracentesis performed yielding 800 cc of turbid, light yellow/beige colored fluid. No immediate complications. Follow-up chest x-ray pending. The fluid was sent to the lab for preordered studies.

## 2015-11-21 NOTE — Assessment & Plan Note (Signed)

## 2015-11-21 NOTE — Assessment & Plan Note (Addendum)
PET 11/20/15  5.6 cm hypermetabolic spiculated right upper lobe mass, highly suspicious for primary bronchogenic carcinoma. Hypermetabolic bilateral hilar lymphadenopathy. No hypermetabolic mediastinal or axillary lymphadenopathy. Small to moderate bilateral pleural effusions, with hypermetabolic activity noted in left lower pleural space. Malignant pleural effusion cannot be excluded. Indeterminate right upper and middle lobe pulmonary nodules measuring up to 10 mm, without significant hypermetabolic activity but too small to definitively characterize by PET. Hypermetabolic mixed lytic and sclerotic lesions in the T1 and T2 vertebral bodies, suspicious for bone metastases. 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 Pleural vs T 1 vs the primary > will start with T centesis since the effusions are symptomatic then discuss with IR but the primary would not be easy to reach   Total time devoted to counseling  = 35/87mreview case with pt/fm discussion of options/alternatives/ personally creating written instructions  in presence of pt  then going over those specific  Instructions directly with the pt including how to use all of the meds but in particular covering each new medication in detail and the difference between the maintenance/automatic meds and the prns using an action plan format for the latter.

## 2015-11-21 NOTE — Assessment & Plan Note (Signed)
Try off acei 11/21/2015 >>>   Clearly has upper airway component that is not explained by ct

## 2015-11-21 NOTE — Patient Instructions (Addendum)
Please see patient coordinator before you leave today  to schedule thoracentesis asap  Stop lisinopril and start diovan 160 mg daily and some of your breathing and coughing and hoarseness issues should gradually improve over the next several weeks  I will call you to arrange follow up after I review the results of your fluid analysis   In meantime, try to commit to quit smoking for good  Add:  Synthroid 25 mcg per day

## 2015-11-21 NOTE — Assessment & Plan Note (Signed)
D/c acei 11/21/2015 due to cough/ upper airway wheeze   In the best review of chronic cough to date ( NEJM 2016 375 [id number]) ,  ACEi are now felt to cause cough in up to  20% of pts which is a 4 fold increase from previous reports and does not include the variety of non-specific complaints we see in pulmonary clinic in pts on ACEi but previously attributed to another dx like  Copd/asthma and  include PNDS, throat and chest congestion, "bronchitis", unexplained dyspnea and noct "strangling" sensations, and hoarseness, but also  atypical /refractory GERD symptoms like dysphagia and "bad heartburn"   The only way I know  to prove this is not an "ACEi Case" is a trial off ACEi x a minimum of 6 weeks then regroup.  Try diovan 160 mg daily to see to what extent any of his symptoms improve

## 2015-11-21 NOTE — Progress Notes (Signed)
Subjective:     Patient ID: Cameron Chandler, male   DOB: 09/01/1955,     MRN: [id number]  HPI  43 yowm active smoker with new sob / leg swelling seen [facility] added lasix but not better with incidental RUL mass > CT > PET > referred to pulmonary clinic 11/21/2015 by Dr Pattricia Boss with / Lung ca   11/21/2015 1st [place] office visit/ [place] Bryngelson   Chief Complaint  Patient presents with  . Pulmonary Consult    Referred by Dr. Telford Nab for eval of lung nodule.  Pt c/o CP, SOB and cough for the past month.  His cough is prod with yellow sputum.    doe x 50 ft , ok at rest, bad cough at hs slt yellow mucus  Started with hoarseness p ET for Neck surgery 02/2015 on ACEi since and can't lie supine s smotering/ choking x sev weeks - no back pain in upper T distribution where PET is Pos. Has h/o colon ca sp chemo and prostate ca sp RT   In terms of cough/ sob No obvious day to day or daytime variability or assoc  mucus plugs or hemoptysis or cp or chest tightness, subjective wheeze or overt sinus or hb symptoms. No unusual exp hx or h/o childhood pna/ asthma or knowledge of premature birth.   Also denies any obvious fluctuation of symptoms with weather or environmental changes or other aggravating or alleviating factors except as outlined above   Current Medications, Allergies, Complete Past Medical History, Past Surgical History, Family History, and Social History were reviewed in [employer] record.  ROS  The following are not active complaints unless bolded sore throat, dysphagia, dental problems, itching, sneezing,  nasal congestion or excess/ purulent secretions, ear ache,   fever, chills, sweats, unintended wt loss, classically pleuritic or exertional cp,  orthopnea pnd or leg swelling, presyncope, palpitations, abdominal pain, anorexia, nausea, vomiting, diarrhea  or change in bowel or bladder habits, change in stools or urine, dysuria,hematuria,  rash, arthralgias, visual  complaints, headache, numbness, weakness or ataxia or problems with walking or coordination,  change in mood/affect or memory.           Review of Systems     Objective:   Physical Exam    amb bm very hoarse/ upper airway wheeze  Wt Readings from Last 3 Encounters:  11/21/15 188 lb 9.6 oz (85.5 kg)  03/05/15 197 lb 4.8 oz (89.5 kg)  03/01/15 197 lb 4.8 oz (89.5 kg)    Vital signs reviewed   HEENT: very poor dentition, nl  turbinates, and oropharynx. Nl external ear canals without cough reflex   NECK :  without JVD/Nodes/TM/ nl carotid upstrokes bilaterally   LUNGS: no acc muscle use,  Nl contour chest which is clear to A and P bilaterally without cough on insp or exp maneuvers   CV:  RRR  no s3 or murmur or increase in P2, 1+ pitting bilateral lower ext  edema   ABD:  soft and nontender with nl inspiratory excursion in the supine position. No bruits or organomegaly, bowel sounds nl  MS:  Nl gait/ ext warm without deformities, calf tenderness, cyanosis or clubbing No obvious joint restrictions   SKIN: warm and dry without lesions    NEURO:  alert, approp, nl sensorium with  no motor deficits      Labs ordered/ reviewed:      Chemistry      Component Value Date/Time   NA  140 11/21/2015 1641   K 4.5 11/21/2015 1641   CL 107 11/21/2015 1641   CO2 28 11/21/2015 1641   BUN 12 11/21/2015 1641   CREATININE 0.77 11/21/2015 1641      Component Value Date/Time   CALCIUM 8.2 (L) 11/21/2015 1641   ALKPHOS 33 (L) 01/25/2015 2324   AST 23 01/25/2015 2324   ALT 24 01/25/2015 2324   BILITOT 0.5 01/25/2015 2324        Lab Results  Component Value Date   WBC 10.6 (H) 11/21/2015   HGB 14.4 11/21/2015   HCT 44.0 11/21/2015   MCV 80.8 11/21/2015   PLT 453.0 (H) 11/21/2015       Lab Results  Component Value Date   TSH 5.90 (H) 11/21/2015     Lab Results  Component Value Date   PROBNP 25.0 11/21/2015              Assessment:

## 2015-11-21 NOTE — Assessment & Plan Note (Signed)
U/s thoracentesis 11/21/2015 >>>   Discussed in detail all the  indications, usual  risks and alternatives  relative to the benefits with patient who agrees to proceed with thoracentesis as will be both dx and therapeutic

## 2015-11-22 ENCOUNTER — Other Ambulatory Visit: Payer: Self-pay | Admitting: Cardiovascular Disease

## 2015-11-22 DIAGNOSIS — E039 Hypothyroidism, unspecified: Secondary | ICD-10-CM | POA: Insufficient documentation

## 2015-11-22 LAB — TRIGLYCERIDES, BODY FLUIDS: Triglycerides, Fluid: 85 mg/dL

## 2015-11-22 MED ORDER — LEVOTHYROXINE SODIUM 25 MCG PO TABS: 25.0000 ug | ORAL_TABLET | Freq: Every day | ORAL | 11 refills | Status: AC

## 2015-11-22 NOTE — Assessment & Plan Note (Signed)
TSH  5.9 could I suppose contribute to tendency to pleural effusions and peripheral edema in absence of evidence of chf > start synthroid 25 mcg per day

## 2015-11-22 NOTE — Progress Notes (Signed)
LMTCB

## 2015-11-26 NOTE — Progress Notes (Signed)
Spoke with pt and notified of results per Dr. Wert. Pt verbalized understanding and denied any questions. 

## 2015-11-27 ENCOUNTER — Telehealth: Payer: Self-pay | Admitting: Internal Medicine

## 2015-11-27 ENCOUNTER — Encounter: Payer: Self-pay | Admitting: Internal Medicine

## 2015-11-27 ENCOUNTER — Telehealth: Payer: Self-pay | Admitting: Medical Oncology

## 2015-11-27 ENCOUNTER — Other Ambulatory Visit: Payer: Self-pay | Admitting: Internal Medicine

## 2015-11-27 DIAGNOSIS — J90 Pleural effusion, not elsewhere classified: Secondary | ICD-10-CM

## 2015-11-27 DIAGNOSIS — R918 Other nonspecific abnormal finding of lung field: Secondary | ICD-10-CM

## 2015-11-27 NOTE — Telephone Encounter (Signed)
Called spoke with Olin Hauser (pt daughter). Pt does not know remember well. She is requesting results from pt recent thoracentesis. Also she reports pt is having swelling in his ankles, feet, and left hand. Please advise MW thanks

## 2015-11-27 NOTE — Progress Notes (Signed)
Called pharm and gave VO for levothyroxine  They will call pt when ready  ASAP oncology referral made

## 2015-11-27 NOTE — Telephone Encounter (Signed)
Per MW he has already spoken with pt daughter. Received a staff message from the cancer center that wife is wanting to know why we referred pt to the cancer center. Per MW, daughter needs to relay the message to pt's wife as to why the referral was done.  Called daughter and No VM WCB

## 2015-11-27 NOTE — Telephone Encounter (Signed)
"  Why was pt referred here?" I forwarded message to Charma Igo at Dr Melvyn Novas re this.

## 2015-11-28 ENCOUNTER — Telehealth: Payer: Self-pay | Admitting: Medical Oncology

## 2015-11-28 ENCOUNTER — Ambulatory Visit (HOSPITAL_COMMUNITY)
Admission: RE | Admit: 2015-11-28 | Discharge: 2015-11-28 | Disposition: A | Discharge status: Home / Self Care | Payer: Medicare HMO | Source: Ambulatory Visit | Attending: Radiology | Admitting: Radiology

## 2015-11-28 ENCOUNTER — Ambulatory Visit (HOSPITAL_COMMUNITY)
Admission: RE | Admit: 2015-11-28 | Discharge: 2015-11-28 | Disposition: A | Discharge status: Home / Self Care | Payer: Medicare HMO | Source: Ambulatory Visit | Attending: Internal Medicine | Admitting: Internal Medicine

## 2015-11-28 DIAGNOSIS — J9811 Atelectasis: Secondary | ICD-10-CM | POA: Insufficient documentation

## 2015-11-28 DIAGNOSIS — C384 Malignant neoplasm of pleura: Secondary | ICD-10-CM | POA: Insufficient documentation

## 2015-11-28 DIAGNOSIS — Z9889 Other specified postprocedural states: Secondary | ICD-10-CM | POA: Insufficient documentation

## 2015-11-28 DIAGNOSIS — J90 Pleural effusion, not elsewhere classified: Secondary | ICD-10-CM | POA: Diagnosis present

## 2015-11-28 NOTE — Telephone Encounter (Signed)
Spoke with Olin Hauser, pt's daughter, she states that pt is having increased SOB and that MW told her that we could order another thoracentesis if needed. I called and spoke with MW and he states ok to go ahead and order this and send fluid for cytology only. STAT order placed and pt's daughter aware.

## 2015-11-28 NOTE — Telephone Encounter (Signed)
Pt daughter cb stating that patient needs to get fluid drained off lungs due to trouble breathing, [phone]

## 2015-11-28 NOTE — Procedures (Signed)
Ultrasound-guided diagnostic and therapeutic right thoracentesis performed yielding 1 liter of chylous/milky  fluid. No immediate complications. Follow-up chest x-ray pending. The fluid was sent to the lab for cytology.

## 2015-11-28 NOTE — Telephone Encounter (Addendum)
Thoracentesis completed and drained 1 liter off pt. FYI- pt has some LU extremety swelling. Will be discharged if CXR. Okay-per Lennette Bihari PA-C.

## 2015-12-03 ENCOUNTER — Telehealth: Payer: Self-pay | Admitting: Internal Medicine

## 2015-12-03 ENCOUNTER — Telehealth: Payer: Self-pay | Admitting: Medical Oncology

## 2015-12-03 ENCOUNTER — Encounter: Payer: Self-pay | Admitting: *Deleted

## 2015-12-03 ENCOUNTER — Encounter: Payer: Self-pay | Admitting: Internal Medicine

## 2015-12-03 ENCOUNTER — Ambulatory Visit (HOSPITAL_BASED_OUTPATIENT_CLINIC_OR_DEPARTMENT_OTHER): Payer: Medicare HMO | Admitting: Internal Medicine

## 2015-12-03 DIAGNOSIS — C7951 Secondary malignant neoplasm of bone: Secondary | ICD-10-CM | POA: Diagnosis not present

## 2015-12-03 DIAGNOSIS — J90 Pleural effusion, not elsewhere classified: Secondary | ICD-10-CM

## 2015-12-03 DIAGNOSIS — C3411 Malignant neoplasm of upper lobe, right bronchus or lung: Secondary | ICD-10-CM | POA: Diagnosis not present

## 2015-12-03 DIAGNOSIS — C3491 Malignant neoplasm of unspecified part of right bronchus or lung: Secondary | ICD-10-CM

## 2015-12-03 DIAGNOSIS — R591 Generalized enlarged lymph nodes: Secondary | ICD-10-CM

## 2015-12-03 HISTORY — DX: Malignant neoplasm of unspecified part of right bronchus or lung: C34.91

## 2015-12-03 NOTE — Progress Notes (Signed)
Oncology Nurse Navigator Documentation  Oncology Nurse Navigator Flowsheets 12/03/2015  Navigator Location [facility]  Navigator Encounter Type Other/per Dr. Julien Nordmann.  I notified [facility] to send tissue for molecular testing, foundation one and PDL 1.  I also notified [facility] regarding referral for patient to be seen regarding pleur x cath placement.   Patient Visit Type MedOnc  Treatment Phase Pre-Tx/Tx Discussion  Barriers/Navigation Needs Coordination of Care  Interventions Coordination of Care  Coordination of Care Other  Acuity Level 2  Acuity Level 2 Assistance expediting appointments;Other  Time Spent with Patient 30

## 2015-12-03 NOTE — Telephone Encounter (Signed)
Pt in lobby.
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
## 2015-12-03 NOTE — Telephone Encounter (Signed)
GAVE PATIENT AVS REPORT AND APPOINTMENTS FOR September, INCLUDING DR MOODY. SPOKE WITH CINDY AT [place] - PATIENT/FAMILY AWARE.

## 2015-12-03 NOTE — Patient Instructions (Signed)
Smoking Cessation, Tips for Success If you are ready to quit smoking, congratulations! You have chosen to help yourself be healthier. Cigarettes bring nicotine, tar, carbon monoxide, and other irritants into your body. Your lungs, heart, and blood vessels will be able to work better without these poisons. There are many different ways to quit smoking. Nicotine gum, nicotine patches, a nicotine inhaler, or nicotine nasal spray can help with physical craving. Hypnosis, support groups, and medicines help break the habit of smoking. WHAT THINGS CAN I DO TO MAKE QUITTING EASIER?  Here are some tips to help you quit for good:  Pick a date when you will quit smoking completely. Tell all of your friends and family about your plan to quit on that date.  Do not try to slowly cut down on the number of cigarettes you are smoking. Pick a quit date and quit smoking completely starting on that day.  Throw away all cigarettes.   Clean and remove all ashtrays from your home, work, and car.  On a card, write down your reasons for quitting. Carry the card with you and read it when you get the urge to smoke.  Cleanse your body of nicotine. Drink enough water and fluids to keep your urine clear or pale yellow. Do this after quitting to flush the nicotine from your body.  Learn to predict your moods. Do not let a bad situation be your excuse to have a cigarette. Some situations in your life might tempt you into wanting a cigarette.  Never have "just one" cigarette. It leads to wanting another and another. Remind yourself of your decision to quit.  Change habits associated with smoking. If you smoked while driving or when feeling stressed, try other activities to replace smoking. Stand up when drinking your coffee. Brush your teeth after eating. Sit in a different chair when you read the paper. Avoid alcohol while trying to quit, and try to drink fewer caffeinated beverages. Alcohol and caffeine may urge you to  smoke.  Avoid foods and drinks that can trigger a desire to smoke, such as sugary or spicy foods and alcohol.  Ask people who smoke not to smoke around you.  Have something planned to do right after eating or having a cup of coffee. For example, plan to take a walk or exercise.  Try a relaxation exercise to calm you down and decrease your stress. Remember, you may be tense and nervous for the first 2 weeks after you quit, but this will pass.  Find new activities to keep your hands busy. Play with a pen, coin, or rubber band. Doodle or draw things on paper.  Brush your teeth right after eating. This will help cut down on the craving for the taste of tobacco after meals. You can also try mouthwash.   Use oral substitutes in place of cigarettes. Try using lemon drops, carrots, cinnamon sticks, or chewing gum. Keep them handy so they are available when you have the urge to smoke.  When you have the urge to smoke, try deep breathing.  Designate your home as a nonsmoking area.  If you are a heavy smoker, ask your health care provider about a prescription for nicotine chewing gum. It can ease your withdrawal from nicotine.  Reward yourself. Set aside the cigarette money you save and buy yourself something nice.  Look for support from others. Join a support group or smoking cessation program. Ask someone at home or at work to help you with your plan   to quit smoking.  Always ask yourself, "Do I need this cigarette or is this just a reflex?" Tell yourself, "Today, I choose not to smoke," or "I do not want to smoke." You are reminding yourself of your decision to quit.  Do not replace cigarette smoking with electronic cigarettes (commonly called e-cigarettes). The safety of e-cigarettes is unknown, and some may contain harmful chemicals.  If you relapse, do not give up! Plan ahead and think about what you will do the next time you get the urge to smoke. HOW WILL I FEEL WHEN I QUIT SMOKING? You  may have symptoms of withdrawal because your body is used to nicotine (the addictive substance in cigarettes). You may crave cigarettes, be irritable, feel very hungry, cough often, get headaches, or have difficulty concentrating. The withdrawal symptoms are only temporary. They are strongest when you first quit but will go away within 10-14 days. When withdrawal symptoms occur, stay in control. Think about your reasons for quitting. Remind yourself that these are signs that your body is healing and getting used to being without cigarettes. Remember that withdrawal symptoms are easier to treat than the major diseases that smoking can cause.  Even after the withdrawal is over, expect periodic urges to smoke. However, these cravings are generally short lived and will go away whether you smoke or not. Do not smoke! WHAT RESOURCES ARE AVAILABLE TO HELP ME QUIT SMOKING? Your health care provider can direct you to community resources or hospitals for support, which may include:  Group support.  Education.  Hypnosis.  Therapy.   This information is not intended to replace advice given to you by your health care provider. Make sure you discuss any questions you have with your health care provider.   Document Released: 12/13/2003 Document Revised: 04/06/2014 Document Reviewed: 09/01/2012 Elsevier Interactive Patient Education 2016 [employer] Inc[employer]  

## 2015-12-10 ENCOUNTER — Ambulatory Visit
Admission: RE | Admit: 2015-12-10 | Discharge: 2015-12-10 | Disposition: A | Discharge status: Home / Self Care | Payer: Medicare HMO | Source: Ambulatory Visit | Attending: Thoracic Surgery (Cardiothoracic Vascular Surgery) | Admitting: Thoracic Surgery (Cardiothoracic Vascular Surgery)

## 2015-12-10 ENCOUNTER — Institutional Professional Consult (permissible substitution) (INDEPENDENT_AMBULATORY_CARE_PROVIDER_SITE_OTHER): Payer: Medicare HMO | Admitting: Thoracic Surgery (Cardiothoracic Vascular Surgery)

## 2015-12-10 ENCOUNTER — Encounter: Payer: Self-pay | Admitting: Radiation Oncology

## 2015-12-10 ENCOUNTER — Other Ambulatory Visit: Payer: Self-pay | Admitting: *Deleted

## 2015-12-10 ENCOUNTER — Encounter: Payer: Self-pay | Admitting: Thoracic Surgery (Cardiothoracic Vascular Surgery)

## 2015-12-10 VITALS — BP 154/106 | HR 117 | Resp 18 | Ht 66.0 in | Wt 195.0 lb

## 2015-12-10 DIAGNOSIS — J90 Pleural effusion, not elsewhere classified: Secondary | ICD-10-CM

## 2015-12-10 DIAGNOSIS — J948 Other specified pleural conditions: Secondary | ICD-10-CM | POA: Diagnosis not present

## 2015-12-10 DIAGNOSIS — J910 Malignant pleural effusion: Secondary | ICD-10-CM | POA: Diagnosis not present
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
## 2015-12-12 ENCOUNTER — Ambulatory Visit: Payer: Medicare HMO

## 2015-12-12 ENCOUNTER — Ambulatory Visit
Admission: RE | Admit: 2015-12-12 | Discharge: 2015-12-12 | Disposition: A | Discharge status: Home / Self Care | Payer: Medicare HMO | Source: Ambulatory Visit | Attending: Radiation Oncology | Admitting: Radiation Oncology

## 2015-12-12 DIAGNOSIS — Z79899 Other long term (current) drug therapy: Secondary | ICD-10-CM | POA: Insufficient documentation

## 2015-12-12 DIAGNOSIS — E785 Hyperlipidemia, unspecified: Secondary | ICD-10-CM | POA: Insufficient documentation

## 2015-12-12 DIAGNOSIS — Z7901 Long term (current) use of anticoagulants: Secondary | ICD-10-CM | POA: Insufficient documentation

## 2015-12-12 DIAGNOSIS — Z86718 Personal history of other venous thrombosis and embolism: Secondary | ICD-10-CM | POA: Insufficient documentation

## 2015-12-12 DIAGNOSIS — Z800 Family history of malignant neoplasm of digestive organs: Secondary | ICD-10-CM | POA: Insufficient documentation

## 2015-12-12 DIAGNOSIS — Z87891 Personal history of nicotine dependence: Secondary | ICD-10-CM | POA: Insufficient documentation

## 2015-12-12 DIAGNOSIS — C7951 Secondary malignant neoplasm of bone: Secondary | ICD-10-CM | POA: Insufficient documentation

## 2015-12-12 DIAGNOSIS — Z923 Personal history of irradiation: Secondary | ICD-10-CM | POA: Insufficient documentation

## 2015-12-12 DIAGNOSIS — M546 Pain in thoracic spine: Secondary | ICD-10-CM | POA: Insufficient documentation

## 2015-12-12 DIAGNOSIS — E119 Type 2 diabetes mellitus without complications: Secondary | ICD-10-CM | POA: Insufficient documentation

## 2015-12-12 DIAGNOSIS — C3411 Malignant neoplasm of upper lobe, right bronchus or lung: Secondary | ICD-10-CM | POA: Insufficient documentation

## 2015-12-12 DIAGNOSIS — Z794 Long term (current) use of insulin: Secondary | ICD-10-CM | POA: Insufficient documentation

## 2015-12-12 DIAGNOSIS — Z85038 Personal history of other malignant neoplasm of large intestine: Secondary | ICD-10-CM | POA: Insufficient documentation

## 2015-12-12 DIAGNOSIS — I10 Essential (primary) hypertension: Secondary | ICD-10-CM | POA: Insufficient documentation

## 2015-12-12 DIAGNOSIS — I2510 Atherosclerotic heart disease of native coronary artery without angina pectoris: Secondary | ICD-10-CM | POA: Insufficient documentation

## 2015-12-12 DIAGNOSIS — Z808 Family history of malignant neoplasm of other organs or systems: Secondary | ICD-10-CM | POA: Insufficient documentation

## 2015-12-12 DIAGNOSIS — Z833 Family history of diabetes mellitus: Secondary | ICD-10-CM | POA: Insufficient documentation

## 2015-12-12 DIAGNOSIS — Z510 Encounter for antineoplastic radiation therapy: Secondary | ICD-10-CM | POA: Insufficient documentation

## 2015-12-12 DIAGNOSIS — I252 Old myocardial infarction: Secondary | ICD-10-CM | POA: Insufficient documentation

## 2015-12-12 DIAGNOSIS — Z7982 Long term (current) use of aspirin: Secondary | ICD-10-CM | POA: Insufficient documentation

## 2015-12-12 DIAGNOSIS — Z8546 Personal history of malignant neoplasm of prostate: Secondary | ICD-10-CM | POA: Insufficient documentation

## 2015-12-12 HISTORY — DX: Malignant (primary) neoplasm, unspecified: C80.1

## 2015-12-12 HISTORY — DX: Malignant neoplasm of bone and articular cartilage, unspecified: C41.9

## 2015-12-13 ENCOUNTER — Ambulatory Visit (HOSPITAL_COMMUNITY)
Admission: RE | Admit: 2015-12-13 | Discharge: 2015-12-13 | Disposition: A | Discharge status: Home / Self Care | Payer: Medicare HMO | Source: Ambulatory Visit | Attending: Internal Medicine | Admitting: Internal Medicine

## 2015-12-13 DIAGNOSIS — C3490 Malignant neoplasm of unspecified part of unspecified bronchus or lung: Secondary | ICD-10-CM | POA: Insufficient documentation

## 2015-12-13 DIAGNOSIS — C61 Malignant neoplasm of prostate: Secondary | ICD-10-CM | POA: Diagnosis not present

## 2015-12-13 DIAGNOSIS — Z981 Arthrodesis status: Secondary | ICD-10-CM | POA: Diagnosis not present

## 2015-12-13 DIAGNOSIS — C3491 Malignant neoplasm of unspecified part of right bronchus or lung: Secondary | ICD-10-CM

## 2015-12-13 MED ORDER — GADOBENATE DIMEGLUMINE 529 MG/ML IV SOLN
20.0000 mL | Freq: Once | INTRAVENOUS | Status: AC | PRN
  Administered 2015-12-13: 18 mL via INTRAVENOUS

## 2015-12-18 ENCOUNTER — Other Ambulatory Visit: Payer: Self-pay | Admitting: Cardiovascular Disease

## 2015-12-18 ENCOUNTER — Other Ambulatory Visit: Payer: Self-pay | Admitting: *Deleted

## 2015-12-18 DIAGNOSIS — J90 Pleural effusion, not elsewhere classified: Secondary | ICD-10-CM

## 2015-12-19 ENCOUNTER — Encounter (HOSPITAL_COMMUNITY): Payer: Self-pay | Admitting: Vascular Surgery

## 2015-12-19 ENCOUNTER — Encounter (HOSPITAL_COMMUNITY)
Admission: RE | Admit: 2015-12-19 | Discharge: 2015-12-19 | Disposition: A | Discharge status: Home / Self Care | Payer: Medicare HMO | Source: Ambulatory Visit | Attending: Thoracic Surgery (Cardiothoracic Vascular Surgery) | Admitting: Thoracic Surgery (Cardiothoracic Vascular Surgery)

## 2015-12-19 ENCOUNTER — Encounter (HOSPITAL_COMMUNITY): Payer: Self-pay | Admitting: Certified Registered Nurse Anesthetist

## 2015-12-19 ENCOUNTER — Encounter (HOSPITAL_COMMUNITY): Payer: Self-pay

## 2015-12-19 ENCOUNTER — Other Ambulatory Visit: Payer: Self-pay | Admitting: *Deleted

## 2015-12-19 DIAGNOSIS — J90 Pleural effusion, not elsewhere classified: Secondary | ICD-10-CM

## 2015-12-19 HISTORY — DX: Acute embolism and thrombosis of unspecified deep veins of unspecified lower extremity: I82.409

## 2015-12-19 LAB — COMPREHENSIVE METABOLIC PANEL
ALBUMIN: 1.8 g/dL — AB (ref 3.5–5.0)
ALK PHOS: 70 U/L (ref 38–126)
ALT: 22 U/L (ref 17–63)
AST: 20 U/L (ref 15–41)
Anion gap: 6 (ref 5–15)
BILIRUBIN TOTAL: 0.3 mg/dL (ref 0.3–1.2)
BUN: 15 mg/dL (ref 6–20)
CALCIUM: 8.3 mg/dL — AB (ref 8.9–10.3)
CO2: 27 mmol/L (ref 22–32)
Chloride: 109 mmol/L (ref 101–111)
Creatinine, Ser: 0.96 mg/dL (ref 0.61–1.24)
GFR calc Af Amer: >60 mL/min (ref >60)
GFR calc non Af Amer: >60 mL/min (ref >60)
GLUCOSE: 77 mg/dL (ref 65–99)
Potassium: 4.2 mmol/L (ref 3.5–5.1)
Sodium: 142 mmol/L (ref 135–145)
TOTAL PROTEIN: 4.7 g/dL — AB (ref 6.5–8.1)

## 2015-12-19 LAB — CBC
HEMATOCRIT: 45.8 % (ref 39.0–52.0)
HEMOGLOBIN: 14.5 g/dL (ref 13.0–17.0)
MCH: 25.9 pg — ABNORMAL LOW (ref 26.0–34.0)
MCHC: 31.7 g/dL (ref 30.0–36.0)
MCV: 81.8 fL (ref 78.0–100.0)
Platelets: 398 10*3/uL (ref 150–400)
RBC: 5.6 MIL/uL (ref 4.22–5.81)
RDW: 16.9 % — ABNORMAL HIGH (ref 11.5–15.5)
WBC: 10.3 10*3/uL (ref 4.0–10.5)

## 2015-12-19 LAB — APTT: APTT: 44 s — AB (ref 24–36)

## 2015-12-19 LAB — GLUCOSE, CAPILLARY: Glucose-Capillary: 86 mg/dL (ref 65–99)

## 2015-12-19 LAB — PROTIME-INR
INR: 2.76
Prothrombin Time: 29.8 seconds — ABNORMAL HIGH (ref 11.4–15.2)

## 2015-12-19 LAB — SURGICAL PCR SCREEN
MRSA, PCR: NEGATIVE
Staphylococcus aureus: POSITIVE — AB

## 2015-12-19 NOTE — Pre-Procedure Instructions (Signed)
Cameron Chandler  12/19/2015      [facility] - [place], [place] - [address] [place] [address] Phone: [phone] Fax: [phone]    Your procedure is scheduled on September 22  Report to [facility] at [place].M.  Call this number if you have problems the morning of surgery:  [phone]   Remember:  Do not eat food or drink liquids after midnight.   Take these medicines the morning of surgery with A SIP OF WATER gabapentin, synthroid, metoprolol, nitro if needed,  7 days prior to surgery STOP taking any Aspirin,  MOBIC, Aleve, Naproxen, Ibuprofen, Motrin, Advil, Goody's, BC's, all herbal medications, fish oil, and all vitamins  WHAT DO I DO ABOUT MY DIABETES MEDICATION?   Marland Kitchen Do not take oral diabetes medicines (pills) the morning of surgery. METFORMIN  DINNER/BEDTIME DOSES OF LEVEMIR is 15 units day before surgery   How to Manage Your Diabetes Before and After Surgery  Why is it important to control my blood sugar before and after surgery? . Improving blood sugar levels before and after surgery helps healing and can limit problems. . A way of improving blood sugar control is eating a healthy diet by: o  Eating less sugar and carbohydrates o  Increasing activity/exercise o  Talking with your doctor about reaching your blood sugar goals . High blood sugars (greater than 180 mg/dL) can raise your risk of infections and slow your recovery, so you will need to focus on controlling your diabetes during the weeks before surgery. . Make sure that the doctor who takes care of your diabetes knows about your planned surgery including the date and location.  How do I manage my blood sugar before surgery? . Check your blood sugar at least 4 times a day, starting 2 days before surgery, to make sure that the level is not too high or low. o Check your blood sugar the morning of your surgery when you wake up and every 2 hours until you get to  the Short Stay unit. . If your blood sugar is less than 70 mg/dL, you will need to treat for low blood sugar: o Do not take insulin. o Treat a low blood sugar (less than 70 mg/dL) with  cup of clear juice (cranberry or apple), 4 glucose tablets, OR glucose gel. o Recheck blood sugar in 15 minutes after treatment (to make sure it is greater than 70 mg/dL). If your blood sugar is not greater than 70 mg/dL on recheck, call [phone] for further instructions. . Report your blood sugar to the short stay nurse when you get to Short Stay.  . If you are admitted to the hospital after surgery: o Your blood sugar will be checked by the staff and you will probably be given insulin after surgery (instead of oral diabetes medicines) to make sure you have good blood sugar levels. o The goal for blood sugar control after surgery is 80-180 mg/dL.   Do not wear jewelry, make-up or nail polish.  Do not wear lotions, powders, or perfumes, or deoderant.  Do not shave 48 hours prior to surgery.  Men may shave face and neck.  Do not bring valuables to the hospital.  [facility] is not responsible for any belongings or valuables.  Contacts, dentures or bridgework may not be worn into surgery.  Leave your suitcase in the car.  After surgery it may be brought to your room.  For patients admitted to the hospital, discharge time will be determined by your treatment team.  Patients discharged the day of surgery will not be allowed to drive home.    Special instructions:   [place]- Preparing For Surgery  Before surgery, you can play an important role. Because skin is not sterile, your skin needs to be as free of germs as possible. You can reduce the number of germs on your skin by washing with CHG (chlorahexidine gluconate) Soap before surgery.  CHG is an antiseptic cleaner which kills germs and bonds with the skin to continue killing germs even after washing.  Please do not use if you have an allergy to CHG  or antibacterial soaps. If your skin becomes reddened/irritated stop using the CHG.  Do not shave (including legs and underarms) for at least 48 hours prior to first CHG shower. It is OK to shave your face.  Please follow these instructions carefully.   1. Shower the NIGHT BEFORE SURGERY and the MORNING OF SURGERY with CHG.   2. If you chose to wash your hair, wash your hair first as usual with your normal shampoo.  3. After you shampoo, rinse your hair and body thoroughly to remove the shampoo.  4. Use CHG as you would any other liquid soap. You can apply CHG directly to the skin and wash gently with a scrungie or a clean washcloth.   5. Apply the CHG Soap to your body ONLY FROM THE NECK DOWN.  Do not use on open wounds or open sores. Avoid contact with your eyes, ears, mouth and genitals (private parts). Wash genitals (private parts) with your normal soap.  6. Wash thoroughly, paying special attention to the area where your surgery will be performed.  7. Thoroughly rinse your body with warm water from the neck down.  8. DO NOT shower/wash with your normal soap after using and rinsing off the CHG Soap.  9. Pat yourself dry with a CLEAN TOWEL.   10. Wear CLEAN PAJAMAS   11. Place CLEAN SHEETS on your bed the night of your first shower and DO NOT SLEEP WITH PETS.    Day of Surgery: Do not apply any deodorants/lotions. Please wear clean clothes to the hospital/surgery center.      Please read over the following fact sheets that you were given. Coughing and Deep Breathing, MRSA Information and Surgical Site Infection Prevention

## 2015-12-19 NOTE — Progress Notes (Signed)
I called a prescription for Mupirocin ointment to [facility] , [place] , [place]

## 2015-12-19 NOTE — Progress Notes (Addendum)
PCP - [facility] Cardiologist - [place]?? Patient is unsure  Chest x-ray - 12/19/15 EKG - 01/25/15 - abnormal Stress Test - denies ECHO - 01-28-15 Cardiac Cath - 2007 stent placed  Anesthesia is reviewing chart  Patient was prescribed Coumadin for a DVT in his left arm last week.  Patients last does was 12/18/15 Fasting bs is 97 usually in the morning  Patient denies shortness of breath, fever, cough and chest pain at PAT appointment

## 2015-12-19 NOTE — Progress Notes (Addendum)
Anesthesia Chart Review: Patient is a 60 year old male scheduled for insertion of right pleural drainage catheter on 12/20/15 by Dr. Roxan Hockey. Dx: Malignant right pleural effusion.  History includes smoking, stage IV right lung cancer with T1-2 mets 10/2015, CAD/NSTEMI '07 s/p PCI CX, HTN, HLD, DM2, colon cancer s/p colon resection and chemotherapy, prostate cancer s/p radiation '14, C5-6 ACDF 03/05/15.  On 12/13/15, Dr. Sherrie Sport started patient on warfarin for finding of chronic left IJ DVT.   PCP is Dr. Sherrie Sport. Pulmonologist is Dr. Melvyn Novas. HEM-ONC is Dr. Julien Nordmann. Cardiologist is Dr. Bronson Ing, last visit 11/20/14. (He cleared him for the 02/2015 ACDF following 12/2014 echo.)    Meds include ASA 81 mg, fenofibric acid, Neurontin, HCTZ, Levemir, Lasix,  metformin, Lopressor, Nitro, Zocor, valsartan. I received a staff message from [facility] that patient was recently started on warfarin, but that he was holding since yesterday until after his surgery.   BP 115/83   Pulse (!) 102   Temp 36.4 C (Oral)   Resp 18   Ht '5\' 6"'$  (1.676 m)   Wt 203 lb 3 oz (92.2 kg)   SpO2 94%   BMI 32.80 kg/m   01/25/15 EKG: SR, abnormal r wave progression, early transition, inferior infarct (old).  01/28/15 Echo: Study Conclusions - Left ventricle: The cavity size was normal. Wall thickness was increased in a pattern of mild LVH. Systolic function was vigorous. The estimated ejection fraction was in the range of 65% to 70%. Wall motion was normal; there were no regional wall motion abnormalities. Doppler parameters are consistent with abnormal left ventricular relaxation (grade 1 diastolic dysfunction).   06/14/05 [facility]: CONCLUSIONS: 1. Non-ST-elevation myocardial infarction with persistent pain with 95%  stenosis in proximal circumflex artery, no significant obstruction and  TIMI-2 flow, no significant obstruction in the LAD and right coronary  arteries but with TIMI-2 flow in  both vessels, inferior and posterior  wall hypokinesis. 2. Successful stenting of the circumflex artery with improvement of central  narrowing from 95% to 0% and improvement in the flow from TIMI-2 to TIMI-  3 flow using a bare metal stent.  01/27/15 Carotid duplex: Summary: - Technically difficult due to high bifurcations. - Bilateral - 1% to 39% ICA stenosis. Unable to adequately evaluate  the right vertebral. The left vertebral artery flow is antegrade.  12/13/15 LUE venous duplex [facility]): Impression: 1. No evidence of acute DVT within the left upper extremity. 2. Examination is positive for mixed echogenic nonocclusive wall thickening/chronic DVT within the left internal jugular vein with partial recannulization. Findings are nonspecific though may be attributable to prior left internal jugular approach central venous access catheter placement. Clinical correlation is advised. 3. Indeterminate mixed echogenic fluid tracking within the subcutaneous tissues from the level of the left elbow to the back of the left hand. Clinical correlation is advised. Further evaluation with MRI could be performed as indicated.  12/19/15 CXR: IMPRESSION: Persistent small bilateral pleural effusions with atelectasis. Minimal change from 12/10/2015.  Preoperative labs noted. PT/INR 29.8/2.76, PTT 44. Will need PT/PTT repeated preoperatively.    I routed LUE venous duplex report and PT/PTT results to Dr. Roxan Hockey and TCTS RN Thurmond Butts. She called back to state that Dr. Roxan Hockey is planning to postpone to give more time for warfarin to reverse. (UPDATE: Case tentatively moved to 12/25/15. CXR report routed to [facility] for review with Dr. Roxan Hockey.)  Myra Gianotti, PA-C [facility] Short Stay Center/Anesthesiology Phone [phone] 12/19/2015 4:12 PM  Addendum: Noted that patient  was admitted to [facility] on 12/22/15 for increased SOB. 12/21/15 CTA chest done and  showed: IMPRESSION: 1. No pulmonary emboli. 2. Moderately large bilateral pleural effusions, significantly increased. 3. Bilateral lower lobe atelectasis. 4. Mild decrease in size of the right upper lobe lung carcinoma with vascular encasement. 5. Stable 1 cm irregular right upper lobe nodule, suspicious for a possible metastasis. 6. Interval 8 mm left upper lobe nodule. This could represent a metastasis or small area of focal consolidation. 7. Edema and mild nodularity in the anterior peritoneal fat anterior to the liver. This could be due to anasarca or developing peritoneal metastatic disease. 8. Diffuse subcutaneous edema at the level of the abdomen. 9. 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 10. Small pericardial effusion with a significant decrease in size.  He was initially bing treated for CAP, but based on symptoms and imaging, PNA felt less likely. By hospitalist's 12/22/15 note they are consulting IR today to perform right thoracentesis (versus plan for Pleurex sooner than planned). Patient may also need left thoracentesis. A repeat LUE venous duplex was also ordered, but is in process. I will notify T[facility] staff of patient's current admission to [facility].    George Hugh [facility] Short Stay Center/Anesthesiology Phone [phone] 12/23/2015 1:37 PM

## 2015-12-20 ENCOUNTER — Other Ambulatory Visit: Payer: Self-pay | Admitting: *Deleted

## 2015-12-20 LAB — HEMOGLOBIN A1C
Hgb A1c MFr Bld: 5.7 % — ABNORMAL HIGH (ref 4.8–5.6)
MEAN PLASMA GLUCOSE: 117 mg/dL

## 2015-12-21 ENCOUNTER — Encounter (HOSPITAL_COMMUNITY): Payer: Self-pay | Admitting: Emergency Medicine

## 2015-12-21 ENCOUNTER — Inpatient Hospital Stay (HOSPITAL_COMMUNITY)
Admission: EM | Admit: 2015-12-21 | Discharge: 2015-12-24 | DRG: 180 | Disposition: A | Discharge status: Home / Self Care | Payer: Medicare HMO | Attending: Internal Medicine | Admitting: Internal Medicine

## 2015-12-21 ENCOUNTER — Emergency Department (HOSPITAL_COMMUNITY): Payer: Medicare HMO

## 2015-12-21 ENCOUNTER — Other Ambulatory Visit: Payer: Self-pay

## 2015-12-21 DIAGNOSIS — Z7982 Long term (current) use of aspirin: Secondary | ICD-10-CM | POA: Diagnosis not present

## 2015-12-21 DIAGNOSIS — D75839 Thrombocytosis, unspecified: Secondary | ICD-10-CM | POA: Diagnosis present

## 2015-12-21 DIAGNOSIS — R609 Edema, unspecified: Secondary | ICD-10-CM

## 2015-12-21 DIAGNOSIS — J910 Malignant pleural effusion: Secondary | ICD-10-CM | POA: Diagnosis present

## 2015-12-21 DIAGNOSIS — Z85038 Personal history of other malignant neoplasm of large intestine: Secondary | ICD-10-CM

## 2015-12-21 DIAGNOSIS — D7589 Other specified diseases of blood and blood-forming organs: Secondary | ICD-10-CM | POA: Diagnosis present

## 2015-12-21 DIAGNOSIS — Z955 Presence of coronary angioplasty implant and graft: Secondary | ICD-10-CM | POA: Diagnosis not present

## 2015-12-21 DIAGNOSIS — E039 Hypothyroidism, unspecified: Secondary | ICD-10-CM | POA: Diagnosis present

## 2015-12-21 DIAGNOSIS — I82629 Acute embolism and thrombosis of deep veins of unspecified upper extremity: Secondary | ICD-10-CM | POA: Diagnosis present

## 2015-12-21 DIAGNOSIS — D72829 Elevated white blood cell count, unspecified: Secondary | ICD-10-CM | POA: Diagnosis present

## 2015-12-21 DIAGNOSIS — Z8546 Personal history of malignant neoplasm of prostate: Secondary | ICD-10-CM

## 2015-12-21 DIAGNOSIS — E119 Type 2 diabetes mellitus without complications: Secondary | ICD-10-CM | POA: Diagnosis present

## 2015-12-21 DIAGNOSIS — C3491 Malignant neoplasm of unspecified part of right bronchus or lung: Principal | ICD-10-CM | POA: Diagnosis present

## 2015-12-21 DIAGNOSIS — I82622 Acute embolism and thrombosis of deep veins of left upper extremity: Secondary | ICD-10-CM | POA: Diagnosis present

## 2015-12-21 DIAGNOSIS — Z800 Family history of malignant neoplasm of digestive organs: Secondary | ICD-10-CM

## 2015-12-21 DIAGNOSIS — E8809 Other disorders of plasma-protein metabolism, not elsewhere classified: Secondary | ICD-10-CM | POA: Diagnosis present

## 2015-12-21 DIAGNOSIS — I5033 Acute on chronic diastolic (congestive) heart failure: Secondary | ICD-10-CM | POA: Diagnosis present

## 2015-12-21 DIAGNOSIS — J189 Pneumonia, unspecified organism: Secondary | ICD-10-CM | POA: Diagnosis present

## 2015-12-21 DIAGNOSIS — Z87891 Personal history of nicotine dependence: Secondary | ICD-10-CM

## 2015-12-21 DIAGNOSIS — J9601 Acute respiratory failure with hypoxia: Secondary | ICD-10-CM | POA: Diagnosis present

## 2015-12-21 DIAGNOSIS — R0602 Shortness of breath: Secondary | ICD-10-CM

## 2015-12-21 DIAGNOSIS — I110 Hypertensive heart disease with heart failure: Secondary | ICD-10-CM | POA: Diagnosis present

## 2015-12-21 DIAGNOSIS — R0600 Dyspnea, unspecified: Secondary | ICD-10-CM | POA: Diagnosis present

## 2015-12-21 DIAGNOSIS — C3411 Malignant neoplasm of upper lobe, right bronchus or lung: Secondary | ICD-10-CM

## 2015-12-21 DIAGNOSIS — I10 Essential (primary) hypertension: Secondary | ICD-10-CM | POA: Diagnosis present

## 2015-12-21 DIAGNOSIS — Z794 Long term (current) use of insulin: Secondary | ICD-10-CM | POA: Diagnosis not present

## 2015-12-21 DIAGNOSIS — Z981 Arthrodesis status: Secondary | ICD-10-CM | POA: Diagnosis not present

## 2015-12-21 DIAGNOSIS — E785 Hyperlipidemia, unspecified: Secondary | ICD-10-CM | POA: Diagnosis present

## 2015-12-21 DIAGNOSIS — C3490 Malignant neoplasm of unspecified part of unspecified bronchus or lung: Secondary | ICD-10-CM | POA: Diagnosis present

## 2015-12-21 DIAGNOSIS — I252 Old myocardial infarction: Secondary | ICD-10-CM | POA: Diagnosis not present

## 2015-12-21 DIAGNOSIS — J90 Pleural effusion, not elsewhere classified: Secondary | ICD-10-CM | POA: Diagnosis not present

## 2015-12-21 DIAGNOSIS — Z923 Personal history of irradiation: Secondary | ICD-10-CM

## 2015-12-21 DIAGNOSIS — D473 Essential (hemorrhagic) thrombocythemia: Secondary | ICD-10-CM | POA: Diagnosis present

## 2015-12-21 DIAGNOSIS — Z791 Long term (current) use of non-steroidal anti-inflammatories (NSAID): Secondary | ICD-10-CM

## 2015-12-21 DIAGNOSIS — I2510 Atherosclerotic heart disease of native coronary artery without angina pectoris: Secondary | ICD-10-CM | POA: Diagnosis present

## 2015-12-21 DIAGNOSIS — Z23 Encounter for immunization: Secondary | ICD-10-CM | POA: Diagnosis not present

## 2015-12-21 DIAGNOSIS — Z79899 Other long term (current) drug therapy: Secondary | ICD-10-CM

## 2015-12-21 DIAGNOSIS — Z9889 Other specified postprocedural states: Secondary | ICD-10-CM

## 2015-12-21 LAB — COMPREHENSIVE METABOLIC PANEL
ALT: 23 U/L (ref 17–63)
AST: 24 U/L (ref 15–41)
Albumin: 1.9 g/dL — ABNORMAL LOW (ref 3.5–5.0)
Alkaline Phosphatase: 66 U/L (ref 38–126)
Anion gap: 1 — ABNORMAL LOW (ref 5–15)
BUN: 18 mg/dL (ref 6–20)
CHLORIDE: 113 mmol/L — AB (ref 101–111)
CO2: 24 mmol/L (ref 22–32)
CREATININE: 0.82 mg/dL (ref 0.61–1.24)
Calcium: 7 mg/dL — ABNORMAL LOW (ref 8.9–10.3)
GFR calc non Af Amer: >60 mL/min (ref >60)
Glucose, Bld: 99 mg/dL (ref 65–99)
POTASSIUM: 4 mmol/L (ref 3.5–5.1)
SODIUM: 138 mmol/L (ref 135–145)
Total Bilirubin: 0.2 mg/dL — ABNORMAL LOW (ref 0.3–1.2)
Total Protein: 5.1 g/dL — ABNORMAL LOW (ref 6.5–8.1)

## 2015-12-21 LAB — CBC WITH DIFFERENTIAL/PLATELET
BASOS ABS: 0 10*3/uL (ref 0.0–0.1)
Basophils Relative: 0 %
EOS ABS: 0.1 10*3/uL (ref 0.0–0.7)
EOS PCT: 1 %
HCT: 43.4 % (ref 39.0–52.0)
Hemoglobin: 14.3 g/dL (ref 13.0–17.0)
Lymphocytes Relative: 4 %
Lymphs Abs: 0.5 10*3/uL — ABNORMAL LOW (ref 0.7–4.0)
MCH: 25.9 pg — AB (ref 26.0–34.0)
MCHC: 32.9 g/dL (ref 30.0–36.0)
MCV: 78.6 fL (ref 78.0–100.0)
Monocytes Absolute: 0.5 10*3/uL (ref 0.1–1.0)
Monocytes Relative: 4 %
Neutro Abs: 11.3 10*3/uL — ABNORMAL HIGH (ref 1.7–7.7)
Neutrophils Relative %: 91 %
PLATELETS: 402 10*3/uL — AB (ref 150–400)
RBC: 5.52 MIL/uL (ref 4.22–5.81)
RDW: 17.9 % — AB (ref 11.5–15.5)
WBC: 12.5 10*3/uL — AB (ref 4.0–10.5)

## 2015-12-21 LAB — BRAIN NATRIURETIC PEPTIDE: B Natriuretic Peptide: 33 pg/mL (ref 0.0–100.0)

## 2015-12-21 LAB — PROTIME-INR
INR: 1.21
PROTHROMBIN TIME: 15.4 s — AB (ref 11.4–15.2)

## 2015-12-21 LAB — TROPONIN I

## 2015-12-21 MED ORDER — SODIUM CHLORIDE 0.9% FLUSH
3.0000 mL | Freq: Two times a day (BID) | INTRAVENOUS | Status: DC
  Administered 2015-12-21 – 2015-12-24 (×5): 3 mL via INTRAVENOUS

## 2015-12-21 MED ORDER — GABAPENTIN 100 MG PO CAPS
100.0000 mg | ORAL_CAPSULE | Freq: Three times a day (TID) | ORAL | Status: DC
  Administered 2015-12-21 – 2015-12-24 (×8): 100 mg via ORAL
  Filled 2015-12-21 (×8): qty 1

## 2015-12-21 MED ORDER — INSULIN ASPART 100 UNIT/ML ~~LOC~~ SOLN
0.0000 [IU] | Freq: Three times a day (TID) | SUBCUTANEOUS | Status: DC
  Administered 2015-12-22: 1 [IU] via SUBCUTANEOUS

## 2015-12-21 MED ORDER — INSULIN DETEMIR 100 UNIT/ML ~~LOC~~ SOLN
20.0000 [IU] | Freq: Every day | SUBCUTANEOUS | Status: DC
  Administered 2015-12-22 – 2015-12-23 (×2): 20 [IU] via SUBCUTANEOUS
  Filled 2015-12-21 (×3): qty 0.2

## 2015-12-21 MED ORDER — ASPIRIN 81 MG PO CHEW
81.0000 mg | CHEWABLE_TABLET | Freq: Every day | ORAL | Status: DC
  Administered 2015-12-22 – 2015-12-24 (×3): 81 mg via ORAL
  Filled 2015-12-21 (×3): qty 1

## 2015-12-21 MED ORDER — AZITHROMYCIN 500 MG IV SOLR
500.0000 mg | INTRAVENOUS | Status: DC
Start: 2015-12-21 — End: 2015-12-24
  Administered 2015-12-22 – 2015-12-23 (×3): 500 mg via INTRAVENOUS
  Filled 2015-12-21 (×3): qty 500

## 2015-12-21 MED ORDER — HEPARIN BOLUS VIA INFUSION
4000.0000 [IU] | Freq: Once | INTRAVENOUS | Status: AC
  Administered 2015-12-22: 4000 [IU] via INTRAVENOUS
  Filled 2015-12-21: qty 4000

## 2015-12-21 MED ORDER — IOPAMIDOL (ISOVUE-300) INJECTION 61%: 75.0000 mL | Freq: Once | INTRAVENOUS | Status: DC | PRN

## 2015-12-21 MED ORDER — ATORVASTATIN CALCIUM 40 MG PO TABS
40.0000 mg | ORAL_TABLET | Freq: Every day | ORAL | Status: DC
  Administered 2015-12-22 – 2015-12-23 (×2): 40 mg via ORAL
  Filled 2015-12-21 (×2): qty 1

## 2015-12-21 MED ORDER — SODIUM CHLORIDE 0.9% FLUSH: 3.0000 mL | INTRAVENOUS | Status: DC | PRN

## 2015-12-21 MED ORDER — FUROSEMIDE 10 MG/ML IJ SOLN
20.0000 mg | Freq: Once | INTRAMUSCULAR | Status: AC
  Administered 2015-12-21: 20 mg via INTRAVENOUS
  Filled 2015-12-21: qty 2

## 2015-12-21 MED ORDER — INSULIN DETEMIR 100 UNIT/ML ~~LOC~~ SOLN: 25.0000 [IU] | Freq: Every day | SUBCUTANEOUS | Status: DC

## 2015-12-21 MED ORDER — ONDANSETRON HCL 4 MG/2ML IJ SOLN: 4.0000 mg | Freq: Four times a day (QID) | INTRAMUSCULAR | Status: DC | PRN

## 2015-12-21 MED ORDER — HYDROCHLOROTHIAZIDE 25 MG PO TABS
25.0000 mg | ORAL_TABLET | Freq: Every day | ORAL | Status: DC
  Administered 2015-12-22 – 2015-12-24 (×3): 25 mg via ORAL
  Filled 2015-12-21 (×3): qty 1

## 2015-12-21 MED ORDER — HEPARIN (PORCINE) IN NACL 100-0.45 UNIT/ML-% IJ SOLN
1200.0000 [IU]/h | INTRAMUSCULAR | Status: DC
  Administered 2015-12-22: 1400 [IU]/h via INTRAVENOUS
  Administered 2015-12-22: 1200 [IU]/h via INTRAVENOUS
  Filled 2015-12-21 (×2): qty 250

## 2015-12-21 MED ORDER — NITROGLYCERIN 0.4 MG SL SUBL: 0.4000 mg | SUBLINGUAL_TABLET | SUBLINGUAL | Status: DC | PRN

## 2015-12-21 MED ORDER — LEVOTHYROXINE SODIUM 50 MCG PO TABS
25.0000 ug | ORAL_TABLET | Freq: Every day | ORAL | Status: DC
  Administered 2015-12-22 – 2015-12-24 (×3): 25 ug via ORAL
  Filled 2015-12-21 (×3): qty 1

## 2015-12-21 MED ORDER — HYDRALAZINE HCL 20 MG/ML IJ SOLN: 10.0000 mg | INTRAMUSCULAR | Status: DC | PRN

## 2015-12-21 MED ORDER — ORAL CARE MOUTH RINSE
15.0000 mL | Freq: Two times a day (BID) | OROMUCOSAL | Status: DC
  Administered 2015-12-21 – 2015-12-24 (×6): 15 mL via OROMUCOSAL

## 2015-12-21 MED ORDER — DEXTROSE 5 % IV SOLN
1.0000 g | INTRAVENOUS | Status: DC
  Administered 2015-12-22 – 2015-12-23 (×3): 1 g via INTRAVENOUS
  Filled 2015-12-21 (×4): qty 10

## 2015-12-21 MED ORDER — LEVOFLOXACIN IN D5W 500 MG/100ML IV SOLN
500.0000 mg | Freq: Once | INTRAVENOUS | Status: AC
  Administered 2015-12-21: 500 mg via INTRAVENOUS
  Filled 2015-12-21: qty 100

## 2015-12-21 MED ORDER — SODIUM CHLORIDE 0.9 % IV SOLN: 250.0000 mL | INTRAVENOUS | Status: DC | PRN

## 2015-12-21 MED ORDER — METOPROLOL TARTRATE 25 MG PO TABS
12.5000 mg | ORAL_TABLET | Freq: Two times a day (BID) | ORAL | Status: DC
  Administered 2015-12-21 – 2015-12-24 (×6): 12.5 mg via ORAL
  Filled 2015-12-21 (×6): qty 1

## 2015-12-21 MED ORDER — IPRATROPIUM-ALBUTEROL 0.5-2.5 (3) MG/3ML IN SOLN: 3.0000 mL | RESPIRATORY_TRACT | Status: DC | PRN

## 2015-12-21 MED ORDER — CEFTRIAXONE SODIUM 1 G IJ SOLR
INTRAMUSCULAR | Status: AC
  Filled 2015-12-21: qty 10

## 2015-12-21 MED ORDER — IRBESARTAN 150 MG PO TABS
150.0000 mg | ORAL_TABLET | Freq: Every day | ORAL | Status: DC
  Administered 2015-12-22 – 2015-12-24 (×3): 150 mg via ORAL
  Filled 2015-12-21 (×3): qty 1

## 2015-12-21 MED ORDER — PNEUMOCOCCAL VAC POLYVALENT 25 MCG/0.5ML IJ INJ
0.5000 mL | INJECTION | INTRAMUSCULAR | Status: AC
  Administered 2015-12-22: 0.5 mL via INTRAMUSCULAR
  Filled 2015-12-21: qty 0.5

## 2015-12-21 MED ORDER — ALPRAZOLAM 0.5 MG PO TABS: 0.2500 mg | ORAL_TABLET | Freq: Two times a day (BID) | ORAL | Status: DC | PRN

## 2015-12-21 MED ORDER — INFLUENZA VAC SPLIT QUAD 0.5 ML IM SUSY
0.5000 mL | PREFILLED_SYRINGE | INTRAMUSCULAR | Status: AC
  Administered 2015-12-22: 0.5 mL via INTRAMUSCULAR
  Filled 2015-12-21: qty 0.5

## 2015-12-21 MED ORDER — ACETAMINOPHEN 325 MG PO TABS
650.0000 mg | ORAL_TABLET | ORAL | Status: DC | PRN
  Administered 2015-12-23 – 2015-12-24 (×2): 650 mg via ORAL
  Filled 2015-12-21 (×2): qty 2

## 2015-12-21 MED ORDER — FENOFIBRATE 54 MG PO TABS
108.0000 mg | ORAL_TABLET | Freq: Every day | ORAL | Status: DC
  Administered 2015-12-22 – 2015-12-24 (×3): 108 mg via ORAL
  Filled 2015-12-21 (×4): qty 2

## 2015-12-21 MED ORDER — MELOXICAM 7.5 MG PO TABS
7.5000 mg | ORAL_TABLET | Freq: Every day | ORAL | Status: DC
  Administered 2015-12-22 – 2015-12-24 (×3): 7.5 mg via ORAL
  Filled 2015-12-21 (×4): qty 1
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
## 2015-12-21 NOTE — ED Notes (Signed)
Report given to [facility], Dept 300, all questions answered

## 2015-12-21 NOTE — ED Notes (Signed)
[facility] called regarding Heparin consult, she stated on call pharmacist for that is Fulton Reek whose number is [phone]
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
## 2015-12-21 NOTE — ED Triage Notes (Signed)
Per EMS, pt is being treated for lung cancer x3 weeks ago. Pt reports " my blood is too thick and haven't been able to be drained this week." pt reports has been taken off of blood thinner. EMS reports 125 solu medrol and albuterol treatment en route. Pt reports increased dyspnea with exertion since this am. Pt denies cp.

## 2015-12-21 NOTE — ED Notes (Signed)
Patient's family contact info:  [name] (daughter) [phone] Edd Fabian (daughter) [phone] Lelon Frohlich (wife) [phone]

## 2015-12-21 NOTE — ED Notes (Signed)
Pt states he feels is breathing "is better as long as Im still, but I get short of breath if I try to get up".

## 2015-12-21 NOTE — ED Notes (Signed)
[facility] [address] nurse notified by myself of [facility] calling and contact info for on call pharmacist

## 2015-12-21 NOTE — ED Notes (Signed)
EDP at bedside  

## 2015-12-21 NOTE — ED Notes (Signed)
Cameron Chandler from [place] called, patient is unable to lay flat for anything length of time, stated he was sending patient back at this. [place] made aware

## 2015-12-21 NOTE — Progress Notes (Signed)
ANTICOAGULATION CONSULT NOTE - Initial Consult  Pharmacy Consult for Heparin Indication: VTE Treatment  No Known Allergies  Patient Measurements: Height: '5\' 6"'$  (167.6 cm) Weight: 203 lb (92.1 kg) IBW/kg (Calculated) : 63.8 Heparin Dosing Weight: 83.4 kg  Vital Signs: Temp: 98.7 F (37.1 C) (09/23 2328) Temp Source: Oral (09/23 2328) BP: 125/87 (09/23 2328) Pulse Rate: 118 (09/23 2328)  Labs:  Recent Labs  12/19/15 1434 12/21/15 1928  HGB 14.5 14.3  HCT 45.8 43.4  PLT 398 402*  APTT 44*  --   LABPROT 29.8* 15.4*  INR 2.76 1.21  CREATININE 0.96 0.82  TROPONINI  --  <0.03    Estimated Creatinine Clearance: 99.2 mL/min (by C-G formula based on SCr of 0.82 mg/dL).   Medical History: Past Medical History:  Diagnosis Date  . Adenocarcinoma of right lung, stage 4 ([place]) 12/03/2015  . Arthritis    back, joint pain  . Bone cancer ([place])    T 1T2  . Cancer [facility]) 2011   colon cancer  . Colon cancer [facility])    Colon resection and Chemotherapy  . Coronary atherosclerosis of native coronary artery   . Diabetes mellitus without complication (HCC)    Type II  . DVT (deep venous thrombosis) (HCC)    Left arm  . Heart attack ([place])   . Myocardial infarction ([place])   . Other and unspecified hyperlipidemia   . Prostate cancer ([place]) 12/13/12   ,Radiation  . Unspecified essential hypertension     Medications:  Scheduled:  . aspirin  81 mg Oral Daily  . atorvastatin  40 mg Oral q1800  . azithromycin  500 mg Intravenous Q24H  . cefTRIAXone (ROCEPHIN)  IV  1 g Intravenous Q24H  . Fenofibric Acid  105 mg Oral Daily  . gabapentin  100 mg Oral TID  . heparin  4,000 Units Intravenous Once  . hydrochlorothiazide  25 mg Oral Daily  . Influenza vac split quadrivalent PF  0.5 mL Intramuscular Tomorrow-1000  . insulin aspart  0-9 Units Subcutaneous TID WC  . insulin detemir  20 Units Subcutaneous QHS  . irbesartan  150 mg Oral Daily  . levothyroxine  25 mcg Oral QAC breakfast  .  mouth rinse  15 mL Mouth Rinse BID  . meloxicam  7.5 mg Oral Daily  . metoprolol tartrate  12.5 mg Oral BID  . pneumococcal 23 valent vaccine  0.5 mL Intramuscular Tomorrow-1000  . sodium chloride flush  3 mL Intravenous Q12H   Infusions:  . heparin     Anti-infectives    Start     Dose/Rate Route Frequency Ordered Stop   12/21/15 2230  cefTRIAXone (ROCEPHIN) 1 g in dextrose 5 % 50 mL IVPB     1 g 100 mL/hr over 30 Minutes Intravenous Every 24 hours 12/21/15 2215 12/28/15 2214   12/21/15 2230  azithromycin (ZITHROMAX) 500 mg in dextrose 5 % 250 mL IVPB     500 mg 250 mL/hr over 60 Minutes Intravenous Every 24 hours 12/21/15 2215 12/28/15 2214   12/21/15 2130  levofloxacin (LEVAQUIN) IVPB 500 mg     500 mg 100 mL/hr over 60 Minutes Intravenous  Once 12/21/15 2123 12/21/15 2303      Assessment: Acute upper extremity DVT Labs reviewed   Goal of Therapy:  Heparin level 0.3-0.7 units/ml Monitor platelets by anticoagulation protocol: Yes   Plan:  Give 4000 units bolus x 1 Start heparin infusion at 1400 units/hr Check anti-Xa level in 6-8 hours and daily while  on heparin Continue to monitor H&H and platelets  Abner Greenspan, Hensley Aziz Bennett 12/21/2015,11:50 PM

## 2015-12-22 ENCOUNTER — Inpatient Hospital Stay (HOSPITAL_COMMUNITY): Payer: Medicare HMO

## 2015-12-22 DIAGNOSIS — J90 Pleural effusion, not elsewhere classified: Secondary | ICD-10-CM

## 2015-12-22 DIAGNOSIS — J9601 Acute respiratory failure with hypoxia: Secondary | ICD-10-CM

## 2015-12-22 DIAGNOSIS — C3491 Malignant neoplasm of unspecified part of right bronchus or lung: Principal | ICD-10-CM

## 2015-12-22 DIAGNOSIS — R0600 Dyspnea, unspecified: Secondary | ICD-10-CM

## 2015-12-22 LAB — GLUCOSE, CAPILLARY
GLUCOSE-CAPILLARY: 116 mg/dL — AB (ref 65–99)
GLUCOSE-CAPILLARY: 131 mg/dL — AB (ref 65–99)
GLUCOSE-CAPILLARY: 121 mg/dL — AB (ref 65–99)
Glucose-Capillary: 108 mg/dL — ABNORMAL HIGH (ref 65–99)
Glucose-Capillary: 112 mg/dL — ABNORMAL HIGH (ref 65–99)
Glucose-Capillary: 125 mg/dL — ABNORMAL HIGH (ref 65–99)

## 2015-12-22 LAB — BASIC METABOLIC PANEL
Anion gap: 1 — ABNORMAL LOW (ref 5–15)
BUN: 20 mg/dL (ref 6–20)
CO2: 26 mmol/L (ref 22–32)
CREATININE: 0.85 mg/dL (ref 0.61–1.24)
Calcium: 7.8 mg/dL — ABNORMAL LOW (ref 8.9–10.3)
Chloride: 109 mmol/L (ref 101–111)
GFR calc Af Amer: >60 mL/min (ref >60)
Glucose, Bld: 120 mg/dL — ABNORMAL HIGH (ref 65–99)
Potassium: 4.3 mmol/L (ref 3.5–5.1)
SODIUM: 136 mmol/L (ref 135–145)

## 2015-12-22 LAB — CBC
HCT: 41.2 % (ref 39.0–52.0)
HEMOGLOBIN: 13.5 g/dL (ref 13.0–17.0)
MCH: 26.2 pg (ref 26.0–34.0)
MCHC: 32.8 g/dL (ref 30.0–36.0)
MCV: 79.8 fL (ref 78.0–100.0)
PLATELETS: 379 10*3/uL (ref 150–400)
RBC: 5.16 MIL/uL (ref 4.22–5.81)
RDW: 17.2 % — ABNORMAL HIGH (ref 11.5–15.5)
WBC: 12.2 10*3/uL — ABNORMAL HIGH (ref 4.0–10.5)

## 2015-12-22 LAB — HEPARIN LEVEL (UNFRACTIONATED): HEPARIN UNFRACTIONATED: 0.95 [IU]/mL — AB (ref 0.30–0.70)

## 2015-12-22 LAB — STREP PNEUMONIAE URINARY ANTIGEN: STREP PNEUMO URINARY ANTIGEN: NEGATIVE

## 2015-12-22 LAB — PROTIME-INR
INR: 1.18
PROTHROMBIN TIME: 15.1 s (ref 11.4–15.2)

## 2015-12-22 LAB — TROPONIN I: Troponin I: 0.03 ng/mL (ref <0.03)

## 2015-12-22 MED ORDER — PERFLUTREN LIPID MICROSPHERE
1.0000 mL | INTRAVENOUS | Status: AC | PRN
  Administered 2015-12-22: 2 mL via INTRAVENOUS
  Filled 2015-12-22: qty 10

## 2015-12-22 MED ORDER — HEPARIN SODIUM (PORCINE) 5000 UNIT/ML IJ SOLN
5000.0000 [IU] | Freq: Three times a day (TID) | INTRAMUSCULAR | Status: DC
  Administered 2015-12-22 – 2015-12-24 (×5): 5000 [IU] via SUBCUTANEOUS
  Filled 2015-12-22 (×5): qty 1

## 2015-12-22 MED ORDER — IOPAMIDOL (ISOVUE-370) INJECTION 76%
100.0000 mL | Freq: Once | INTRAVENOUS | Status: AC | PRN
  Administered 2015-12-22: 100 mL via INTRAVENOUS

## 2015-12-22 MED ORDER — AZITHROMYCIN 500 MG IV SOLR
INTRAVENOUS | Status: AC
  Filled 2015-12-22: qty 500

## 2015-12-22 NOTE — Progress Notes (Signed)
ANTICOAGULATION CONSULT NOTE -  Pharmacy Consult for Heparin Indication: VTE Treatment  No Known Allergies  Patient Measurements: Height: '5\' 6"'$  (167.6 cm) Weight: 205 lb 1.6 oz (93 kg) IBW/kg (Calculated) : 63.8 Heparin Dosing Weight: 83.4 kg  Vital Signs: Temp: 98.6 F (37 C) (09/24 0553) Temp Source: Oral (09/24 0553) BP: 128/95 (09/24 0553) Pulse Rate: 92 (09/24 0553)  Labs:  Recent Labs  12/19/15 1434 12/21/15 1928 12/22/15 0843  HGB 14.5 14.3 13.5  HCT 45.8 43.4 41.2  PLT 398 402* 379  APTT 44*  --   --   LABPROT 29.8* 15.4* 15.1  INR 2.76 1.21 1.18  HEPARINUNFRC  --   --  0.95*  CREATININE 0.96 0.82 0.85  TROPONINI  --  <0.03 <0.03    Estimated Creatinine Clearance: 96.2 mL/min (by C-G formula based on SCr of 0.85 mg/dL).   Medical History: Past Medical History:  Diagnosis Date  . Adenocarcinoma of right lung, stage 4 ([place]) 12/03/2015  . Arthritis    back, joint pain  . Bone cancer ([place])    T 1T2  . Cancer [facility]) 2011   colon cancer  . Colon cancer [facility])    Colon resection and Chemotherapy  . Coronary atherosclerosis of native coronary artery   . Diabetes mellitus without complication (HCC)    Type II  . DVT (deep venous thrombosis) (HCC)    Left arm  . Heart attack ([place])   . Myocardial infarction ([place])   . Other and unspecified hyperlipidemia   . Prostate cancer ([place]) 12/13/12   ,Radiation  . Unspecified essential hypertension     Medications:  Scheduled:  . aspirin  81 mg Oral Daily  . atorvastatin  40 mg Oral q1800  . azithromycin  500 mg Intravenous Q24H  . cefTRIAXone (ROCEPHIN)  IV  1 g Intravenous Q24H  . fenofibrate  108 mg Oral Daily  . gabapentin  100 mg Oral TID  . hydrochlorothiazide  25 mg Oral Daily  . insulin aspart  0-9 Units Subcutaneous TID WC  . insulin detemir  20 Units Subcutaneous QHS  . irbesartan  150 mg Oral Daily  . levothyroxine  25 mcg Oral QAC breakfast  . mouth rinse  15 mL Mouth Rinse BID  . meloxicam   7.5 mg Oral Daily  . metoprolol tartrate  12.5 mg Oral BID  . sodium chloride flush  3 mL Intravenous Q12H   Infusions:  . heparin 1,400 Units/hr (12/22/15 0300)   Anti-infectives    Start     Dose/Rate Route Frequency Ordered Stop   12/21/15 2230  cefTRIAXone (ROCEPHIN) 1 g in dextrose 5 % 50 mL IVPB     1 g 100 mL/hr over 30 Minutes Intravenous Every 24 hours 12/21/15 2215 12/28/15 2214   12/21/15 2230  azithromycin (ZITHROMAX) 500 mg in dextrose 5 % 250 mL IVPB     500 mg 250 mL/hr over 60 Minutes Intravenous Every 24 hours 12/21/15 2215 12/28/15 2214   12/21/15 2130  levofloxacin (LEVAQUIN) IVPB 500 mg     500 mg 100 mL/hr over 60 Minutes Intravenous  Once 12/21/15 2123 12/21/15 2303      Assessment: Diagnosed with [place] by PCP, prescribed warfarin, took only 2 doses due to planned Pleurx cathter placement. Hypoxia, acute worsen dyspnea, concern for PE Initial heparin level above goal No signs of bleeding   Goal of Therapy:  Heparin level 0.3-0.7 units/ml Monitor platelets by anticoagulation protocol: Yes   Plan:  Decrease heparin rate  to 1200 units per hour Heparin level in 6 hours and daily CBC daily while on heparin Monitor for signs of bleeding  Abner Greenspan, Judith Demps Bennett 12/22/2015,10:24 AM

## 2015-12-22 NOTE — Progress Notes (Signed)
Pharmacy Antibiotic Note  Cameron Chandler is a 60 y.o. male admitted on 12/21/2015 with pneumonia.  Pharmacy has been consulted for Renal dose adjustment dosing.  Plan: Continue Azithromycin 500 mg IV every 24 hours Continue Ceftriaxone 1 GM IV every 24 hours No renal adjustment necessary  Height: '5\' 6"'$  (167.6 cm) Weight: 205 lb 1.6 oz (93 kg) IBW/kg (Calculated) : 63.8  Temp (24hrs), Avg:98.4 F (36.9 C), Min:98 F (36.7 C), Max:98.7 F (37.1 C)   Recent Labs Lab 12/19/15 1434 12/21/15 1928 12/22/15 0843  WBC 10.3 12.5* 12.2*  CREATININE 0.96 0.82 0.85    Estimated Creatinine Clearance: 96.2 mL/min (by C-G formula based on SCr of 0.85 mg/dL).    No Known Allergies  Antimicrobials this admission: Ceftriaxone 9/23 >>  Azithromycin  9/24 >>  Levaquin 9/23 x 1 dose ED  Dose adjustments this admission:    Thank you for allowing pharmacy to be a part of this patient's care.  Chriss Czar 12/22/2015 10:18 AM

## 2015-12-22 NOTE — Progress Notes (Signed)
Echocardiogram 2D Echocardiogram has been performed with definity.  Aggie Cosier 12/22/2015, 2:07 PM

## 2015-12-22 NOTE — Progress Notes (Signed)
PROGRESS NOTE    Cameron Chandler  OZD:[id number] DOB: October 18, 1955 DOA: 12/21/2015 PCP: [name], MD     Brief Narrative:  60 y/o man admitted from home of 9/23 with progressive increase in SOB. Has a h/o NSCLC with recurrant malignant pleural effusions and is scheduled for pleurex on 9/27. Recently diagnosed with LUE DVT and anticoagulation has not been started over concerns for pleurex placement.   Assessment & Plan:   Principal Problem:   CAP (community acquired pneumonia) Active Problems:   Essential hypertension   CAD, NATIVE VESSEL   Diabetes mellitus without complication (HCC)   Pleural effusion, bilateral   Hypothyroidism, acquired   Adenocarcinoma of right lung, stage 4 (HCC)   Acute respiratory failure with hypoxia (HCC)   Thrombocytosis (HCC)   Leukocytosis   Lung cancer (HCC)   Hypocalcemia   Acute on chronic diastolic CHF (congestive heart failure) (HCC)   DVT of upper extremity (deep vein thrombosis) (HCC)   Acute Hypoxemic Resp FAilure -CT chest negative for PE, large bilateral pleural effusions. -Will request IR to see in am to perform thoracentesis vs pleurex catheter sooner than planned. Tho pleurex was planned for the right, will likely need a left thoracentesis as well. -Doubt PNA based on CT results and lack of symptoms, will DC abx.  DVT -Reported LUE DVT from OP. -As CT chest negative for PE, will repeat doppler to make sure DVT is confirmed before we commit him to a prolonged course of anticoagulation as he will likely need several invasive procedures in the near future. -Will hold heparin for now.  Lung cancer -F/u OP with oncology as scheduled. -Pleurex to drain effusions.  HTN -Well controlled. -Continue home medications.  DM -Fair control, continue current management.  Hypothyroidism -Continue synthroid.   DVT prophylaxis: SQ heparin Code Status: full code Family Communication: patient only Disposition Plan: pending  clinical course  Consultants:   IR  Procedures:   none  Antimicrobials:   none    Subjective: Still SOB this am, states a little improved compared to admission  Objective: Vitals:   12/21/15 2200 12/21/15 2328 12/22/15 0553 12/22/15 1330  BP: 122/93 125/87 (!) 128/95 131/86  Pulse: 113 (!) 118 92 88  Resp: '25 20 20 20  '$ Temp:  98.7 F (37.1 C) 98.6 F (37 C) 98.5 F (36.9 C)  TempSrc:  Oral Oral   SpO2: 96% 98% 97% 98%  Weight:   93 kg (205 lb 1.6 oz)   Height:        Intake/Output Summary (Last 24 hours) at 12/22/15 1750 Last data filed at 12/22/15 1717  Gross per 24 hour  Intake           1246.6 ml  Output              850 ml  Net            396.6 ml   Filed Weights   12/21/15 1825 12/22/15 0553  Weight: 92.1 kg (203 lb) 93 kg (205 lb 1.6 oz)    Examination:  General exam: Alert, awake, oriented x 3 Respiratory system: decreased bilateral BS. Cardiovascular system:RRR. No murmurs, rubs, gallops. Gastrointestinal system: Abdomen is nondistended, soft and nontender. No organomegaly or masses felt. Normal bowel sounds heard. Central nervous system: Alert and oriented. No focal neurological deficits. Extremities: No C/C/E, +pedal pulses Skin: No rashes, lesions or ulcers Psychiatry: Judgement and insight appear normal. Mood & affect appropriate.     Data Reviewed: I  have personally reviewed following labs and imaging studies  CBC:  Recent Labs Lab 12/19/15 1434 12/21/15 1928 12/22/15 0843  WBC 10.3 12.5* 12.2*  NEUTROABS  --  11.3*  --   HGB 14.5 14.3 13.5  HCT 45.8 43.4 41.2  MCV 81.8 78.6 79.8  PLT 398 402* 403   Basic Metabolic Panel:  Recent Labs Lab 12/19/15 1434 12/21/15 1928 12/22/15 0843  NA 142 138 136  K 4.2 4.0 4.3  CL 109 113* 109  CO2 '27 24 26  '$ GLUCOSE 77 99 120*  BUN '15 18 20  '$ CREATININE 0.96 0.82 0.85  CALCIUM 8.3* 7.0* 7.8*   GFR: Estimated Creatinine Clearance: 96.2 mL/min (by C-G formula based on SCr of 0.85  mg/dL). Liver Function Tests:  Recent Labs Lab 12/19/15 1434 12/21/15 1928  AST 20 24  ALT 22 23  ALKPHOS 70 66  BILITOT 0.3 0.2*  PROT 4.7* 5.1*  ALBUMIN 1.8* 1.9*   No results for input(s): LIPASE, AMYLASE in the last 168 hours. No results for input(s): AMMONIA in the last 168 hours. Coagulation Profile:  Recent Labs Lab 12/19/15 1434 12/21/15 1928 12/22/15 0843  INR 2.76 1.21 1.18   Cardiac Enzymes:  Recent Labs Lab 12/21/15 1928 12/22/15 0843 12/22/15 1452  TROPONINI <0.03 <0.03 0.03*   BNP (last 3 results)  Recent Labs  11/21/15 1641  PROBNP 25.0   HbA1C: No results for input(s): HGBA1C in the last 72 hours. CBG:  Recent Labs Lab 12/22/15 0012 12/22/15 0717 12/22/15 1207 12/22/15 1243 12/22/15 1629  GLUCAP 125* 112* 116* 131* 108*   Lipid Profile: No results for input(s): CHOL, HDL, LDLCALC, TRIG, CHOLHDL, LDLDIRECT in the last 72 hours. Thyroid Function Tests: No results for input(s): TSH, T4TOTAL, FREET4, T3FREE, THYROIDAB in the last 72 hours. Anemia Panel: No results for input(s): VITAMINB12, FOLATE, FERRITIN, TIBC, IRON, RETICCTPCT in the last 72 hours. Urine analysis:    Component Value Date/Time   COLORURINE YELLOW 01/26/2015 [place] 01/26/2015 0047   LABSPEC 1.018 01/26/2015 0047   PHURINE 5.5 01/26/2015 [place] 01/26/2015 0047   HGBUR NEGATIVE 01/26/2015 0047   BILIRUBINUR NEGATIVE 01/26/2015 0047   [place] 01/26/2015 0047   PROTEINUR NEGATIVE 01/26/2015 0047   UROBILINOGEN 0.2 01/26/2015 0047   NITRITE NEGATIVE 01/26/2015 0047   LEUKOCYTESUR NEGATIVE 01/26/2015 0047   Sepsis Labs: '@LABRCNTIP'$ (procalcitonin:4,lacticidven:4)  ) Recent Results (from the past 240 hour(s))  Surgical pcr screen     Status: Abnormal   Collection Time: 12/19/15  3:15 PM  Result Value Ref Range Status   MRSA, PCR NEGATIVE NEGATIVE Final   Staphylococcus aureus POSITIVE (A) NEGATIVE Final     Comment:        The Xpert SA Assay (FDA approved for NASAL specimens in patients over 34 years of age), is one component of a comprehensive surveillance program.  Test performance has been validated by [facility] for patients greater than or equal to 5 year old. It is not intended to diagnose infection nor to guide or monitor treatment.   Culture, blood (routine x 2) Call MD if unable to obtain prior to antibiotics being given     Status: None (Preliminary result)   Collection Time: 12/21/15 10:37 PM  Result Value Ref Range Status   Specimen Description BLOOD RIGHT HAND  Final   Special Requests BOTTLES DRAWN AEROBIC AND ANAEROBIC 6CC  Final   Culture NO GROWTH < 12 HOURS  Final   Report Status PENDING  Incomplete  Culture, blood (routine x 2) Call MD if unable to obtain prior to antibiotics being given     Status: None (Preliminary result)   Collection Time: 12/21/15 10:39 PM  Result Value Ref Range Status   Specimen Description BLOOD RIGHT HAND  Final   Special Requests BOTTLES DRAWN AEROBIC AND ANAEROBIC 6CC  Final   Culture NO GROWTH < 12 HOURS  Final   Report Status PENDING  Incomplete         Radiology Studies: Dg Chest 2 View  Result Date: 12/21/2015 CLINICAL DATA:  Shortness of breath, lung cancer EXAM: CHEST  2 VIEW COMPARISON:  12/19/2015 FINDINGS: Medial right upper lobe mass, corresponding to known lung cancer. Patchy left lower lobe opacity, atelectasis versus pneumonia. Patchy right lower lobe opacity, likely atelectasis. Small bilateral pleural effusions.  No pneumothorax. The heart is normal in size IMPRESSION: Medial right upper lobe mass, corresponding to known lung cancer. Patchy left lower lobe opacity, atelectasis versus pneumonia. Right lower lobe atelectasis. Small bilateral pleural effusions. Electronically Signed   By: Julian Hy M.D.   On: 12/21/2015 19:09   Ct Angio Chest Pe W Or Wo Contrast  Result Date: 12/22/2015 CLINICAL DATA:  Dyspnea  and shortness of breath for the past 3 days. EXAM: CT ANGIOGRAPHY CHEST WITH CONTRAST TECHNIQUE: Multidetector CT imaging of the chest was performed using the standard protocol during bolus administration of intravenous contrast. Multiplanar CT image reconstructions and MIPs were obtained to evaluate the vascular anatomy. CONTRAST:  100 cc Isovue 370 COMPARISON:  Chest radiographs obtained yesterday. PET-CT dated 11/20/2015 and chest CT dated 11/10/2015. Stage IV right upper lobe lung carcinoma. FINDINGS: Cardiovascular: Satisfactory opacification of the pulmonary arteries to the segmental level. No evidence of pulmonary embolism. Normal heart size. Small pericardial effusion with a significant decrease in size since 11/10/2015, currently 6 mm in maximum thickness. This previously measured 18 mm in maximum thickness. Mediastinum/Nodes: Bilateral enlarged hilar lymph nodes are again demonstrated. The previously demonstrated 14 mm short axis right hilar node continues to measure 14 mm in maximum diameter on image number 51 of series 4. A 14 mm short axis left hilar node measures 15 mm in corresponding diameter today on image number 47 of series 4. No enlarged mediastinal nodes. Lungs/Pleura: Moderately large bilateral pleural effusions with a significant increase in amount. Bilateral lower lobe atelectasis. The previously demonstrated 6.8 x 3.2 cm right upper lobe mass currently measures 5.3 x 2.3 cm in corresponding dimensions. The previously demonstrated 1.0 cm right upper lobe nodule measures 1.0 cm on image number 38 of series 6. There is an interval nodular density in the left upper lobe measuring 8 mm in maximum diameter on image number 50. Upper Abdomen: Progressive mild soft tissue density in the anterior peritoneal fat, anterior to the liver. Small amount of upper abdominal ascites. Mild diffuse abdominal subcutaneous edema. Musculoskeletal: Lytic and sclerotic lesions in the T1 and T2 vertebral bodies are  again demonstrated with little change. No interval collapse. There is also patchy sclerosis in the C6 and C7 vertebral bodies with Schmorl's node formation, most likely degenerative. There are similar changes on both sides of the right sternoclavicular joint with bilateral sternoclavicular joint degenerative changes noted. Review of the MIP images confirms the above findings. IMPRESSION: 1. No pulmonary emboli. 2. Moderately large bilateral pleural effusions, significantly increased. 3. Bilateral lower lobe atelectasis. 4. Mild decrease in size of the right upper lobe lung carcinoma with vascular encasement. 5. Stable 1 cm irregular right  upper lobe nodule, suspicious for a possible metastasis. 6. Interval 8 mm left upper lobe nodule. This could represent a metastasis or small area of focal consolidation. 7. Edema and mild nodularity in the anterior peritoneal fat anterior to the liver. This could be due to anasarca or developing peritoneal metastatic disease. 8. Diffuse subcutaneous edema at the level of the abdomen. 9. 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 10. Small pericardial effusion with a significant decrease in size. Electronically Signed   By: Claudie Revering M.D.   On: 12/22/2015 12:36        Scheduled Meds: . aspirin  81 mg Oral Daily  . atorvastatin  40 mg Oral q1800  . azithromycin  500 mg Intravenous Q24H  . cefTRIAXone (ROCEPHIN)  IV  1 g Intravenous Q24H  . fenofibrate  108 mg Oral Daily  . gabapentin  100 mg Oral TID  . hydrochlorothiazide  25 mg Oral Daily  . insulin aspart  0-9 Units Subcutaneous TID WC  . insulin detemir  20 Units Subcutaneous QHS  . irbesartan  150 mg Oral Daily  . levothyroxine  25 mcg Oral QAC breakfast  . mouth rinse  15 mL Mouth Rinse BID  . meloxicam  7.5 mg Oral Daily  . metoprolol tartrate  12.5 mg Oral BID  . sodium chloride flush  3 mL Intravenous Q12H   Continuous Infusions:    LOS: 1 day    Time spent: 35 minutes. Greater than 50% of this  time was spent in direct contact with the patient coordinating care.     Lelon Frohlich, MD [facility] Pager [phone]  If 7PM-7AM, please contact night-coverage [email or web address] Password [email or web address] 12/22/2015, 5:50 PM

## 2015-12-22 NOTE — Progress Notes (Signed)
CRITICAL VALUE ALERT  Critical value received:  Troponin 0.03  Date of notification:  12/22/15  Time of notification:  0786  Critical value read back:Yes.    Nurse who received alert:  Theola Sequin RN  MD notified (1st page):  Jerilee Hoh MD  Time of first page:  [phone]

## 2015-12-23 ENCOUNTER — Inpatient Hospital Stay (HOSPITAL_COMMUNITY): Payer: Medicare HMO

## 2015-12-23 ENCOUNTER — Encounter (HOSPITAL_COMMUNITY): Payer: Self-pay

## 2015-12-23 DIAGNOSIS — J189 Pneumonia, unspecified organism: Secondary | ICD-10-CM

## 2015-12-23 LAB — BASIC METABOLIC PANEL
ANION GAP: 6 (ref 5–15)
BUN: 22 mg/dL — ABNORMAL HIGH (ref 6–20)
CALCIUM: 8.1 mg/dL — AB (ref 8.9–10.3)
CO2: 25 mmol/L (ref 22–32)
CREATININE: 0.77 mg/dL (ref 0.61–1.24)
Chloride: 106 mmol/L (ref 101–111)
GFR calc non Af Amer: >60 mL/min (ref >60)
Glucose, Bld: 71 mg/dL (ref 65–99)
Potassium: 3.8 mmol/L (ref 3.5–5.1)
SODIUM: 137 mmol/L (ref 135–145)

## 2015-12-23 LAB — HIV ANTIBODY (ROUTINE TESTING W REFLEX): HIV Screen 4th Generation wRfx: NONREACTIVE

## 2015-12-23 LAB — CBC
HCT: 41.4 % (ref 39.0–52.0)
HEMOGLOBIN: 13.3 g/dL (ref 13.0–17.0)
MCH: 25.6 pg — AB (ref 26.0–34.0)
MCHC: 32.1 g/dL (ref 30.0–36.0)
MCV: 79.6 fL (ref 78.0–100.0)
PLATELETS: 411 10*3/uL — AB (ref 150–400)
RBC: 5.2 MIL/uL (ref 4.22–5.81)
RDW: 17.4 % — ABNORMAL HIGH (ref 11.5–15.5)
WBC: 12 10*3/uL — AB (ref 4.0–10.5)

## 2015-12-23 LAB — GLUCOSE, CAPILLARY
GLUCOSE-CAPILLARY: 84 mg/dL (ref 65–99)
GLUCOSE-CAPILLARY: 78 mg/dL (ref 65–99)
Glucose-Capillary: 75 mg/dL (ref 65–99)
Glucose-Capillary: 109 mg/dL — ABNORMAL HIGH (ref 65–99)

## 2015-12-23 LAB — LEGIONELLA PNEUMOPHILA SEROGP 1 UR AG: L. pneumophila Serogp 1 Ur Ag: NEGATIVE

## 2015-12-23 NOTE — Progress Notes (Signed)
PROGRESS NOTE    Cameron Chandler  NLG:[id number] DOB: 05-10-1955 DOA: 12/21/2015 PCP: [name], MD     Brief Narrative:  60 y/o man admitted from home of 9/23 with progressive increase in SOB. Has a h/o NSCLC with recurrant malignant pleural effusions and is scheduled for pleurex on 9/27. Recently diagnosed with LUE DVT and anticoagulation has not been started over concerns for pleurex placement.   Assessment & Plan:   Principal Problem:   CAP (community acquired pneumonia) Active Problems:   Essential hypertension   CAD, NATIVE VESSEL   Diabetes mellitus without complication (HCC)   Pleural effusion, bilateral   Hypothyroidism, acquired   Adenocarcinoma of right lung, stage 4 (HCC)   Acute respiratory failure with hypoxia (HCC)   Thrombocytosis (HCC)   Leukocytosis   Lung cancer (HCC)   Hypocalcemia   Acute on chronic diastolic CHF (congestive heart failure) (HCC)   DVT of upper extremity (deep vein thrombosis) (HCC)   Acute Hypoxemic Resp Failure -CT chest negative for PE, large bilateral pleural effusions. -IR has performed thoracentesis today with removal of 1.5 L. -Has appointment on Wednesday 9/27 for placement of pleurex catheter with CT surgery.  DVT -Hold anticoagulation until after pleurex placement in 2 days.  Lung cancer -F/u OP with oncology as scheduled. -Pleurex to drain effusions.  HTN -Well controlled. -Continue home medications.  DM -Fair control, continue current management.  Hypothyroidism -Continue synthroid.   DVT prophylaxis: SQ heparin Code Status: full code Family Communication: patient only Disposition Plan: likely DC home in 24 hours  Consultants:   IR  Procedures:   none  Antimicrobials:   none    Subjective: Still SOB this am, states a little improved compared to admission  Objective: Vitals:   12/23/15 0647 12/23/15 1300 12/23/15 1344 12/23/15 1557  BP: 98/71 110/80 (!) 86/59 (!) 87/75  Pulse: 88 82  78 76  Resp: '20 20 20 18  '$ Temp: 97.7 F (36.5 C)   98.1 F (36.7 C)  TempSrc: Oral   Oral  SpO2: 98% 98% 96% 99%  Weight: 94.1 kg (207 lb 6.4 oz)     Height:        Intake/Output Summary (Last 24 hours) at 12/23/15 1643 Last data filed at 12/22/15 1717  Gross per 24 hour  Intake            337.8 ml  Output                0 ml  Net            337.8 ml   Filed Weights   12/21/15 1825 12/22/15 0553 12/23/15 0647  Weight: 92.1 kg (203 lb) 93 kg (205 lb 1.6 oz) 94.1 kg (207 lb 6.4 oz)    Examination:  General exam: Alert, awake, oriented x 3 Respiratory system: decreased bilateral BS. Cardiovascular system:RRR. No murmurs, rubs, gallops. Gastrointestinal system: Abdomen is nondistended, soft and nontender. No organomegaly or masses felt. Normal bowel sounds heard. Central nervous system: Alert and oriented. No focal neurological deficits. Extremities: No C/C/E, +pedal pulses Skin: No rashes, lesions or ulcers Psychiatry: Judgement and insight appear normal. Mood & affect appropriate.     Data Reviewed: I have personally reviewed following labs and imaging studies  CBC:  Recent Labs Lab 12/19/15 1434 12/21/15 1928 12/22/15 0843 12/23/15 0510  WBC 10.3 12.5* 12.2* 12.0*  NEUTROABS  --  11.3*  --   --   HGB 14.5 14.3 13.5 13.3  HCT  45.8 43.4 41.2 41.4  MCV 81.8 78.6 79.8 79.6  PLT 398 402* 379 625*   Basic Metabolic Panel:  Recent Labs Lab 12/19/15 1434 12/21/15 1928 12/22/15 0843 12/23/15 0510  NA 142 138 136 137  K 4.2 4.0 4.3 3.8  CL 109 113* 109 106  CO2 '27 24 26 25  '$ GLUCOSE 77 99 120* 71  BUN '15 18 20 '$ 22*  CREATININE 0.96 0.82 0.85 0.77  CALCIUM 8.3* 7.0* 7.8* 8.1*   GFR: Estimated Creatinine Clearance: 102.8 mL/min (by C-G formula based on SCr of 0.77 mg/dL). Liver Function Tests:  Recent Labs Lab 12/19/15 1434 12/21/15 1928  AST 20 24  ALT 22 23  ALKPHOS 70 66  BILITOT 0.3 0.2*  PROT 4.7* 5.1*  ALBUMIN 1.8* 1.9*   No results for  input(s): LIPASE, AMYLASE in the last 168 hours. No results for input(s): AMMONIA in the last 168 hours. Coagulation Profile:  Recent Labs Lab 12/19/15 1434 12/21/15 1928 12/22/15 0843  INR 2.76 1.21 1.18   Cardiac Enzymes:  Recent Labs Lab 12/21/15 1928 12/22/15 0843 12/22/15 1452  TROPONINI <0.03 <0.03 0.03*   BNP (last 3 results)  Recent Labs  11/21/15 1641  PROBNP 25.0   HbA1C: No results for input(s): HGBA1C in the last 72 hours. CBG:  Recent Labs Lab 12/22/15 1243 12/22/15 1629 12/22/15 2118 12/23/15 0753 12/23/15 1140  GLUCAP 131* 108* 121* 84 78   Lipid Profile: No results for input(s): CHOL, HDL, LDLCALC, TRIG, CHOLHDL, LDLDIRECT in the last 72 hours. Thyroid Function Tests: No results for input(s): TSH, T4TOTAL, FREET4, T3FREE, THYROIDAB in the last 72 hours. Anemia Panel: No results for input(s): VITAMINB12, FOLATE, FERRITIN, TIBC, IRON, RETICCTPCT in the last 72 hours. Urine analysis:    Component Value Date/Time   COLORURINE YELLOW 01/26/2015 [place] 01/26/2015 0047   LABSPEC 1.018 01/26/2015 0047   PHURINE 5.5 01/26/2015 [place] 01/26/2015 0047   HGBUR NEGATIVE 01/26/2015 0047   BILIRUBINUR NEGATIVE 01/26/2015 0047   [place] 01/26/2015 0047   PROTEINUR NEGATIVE 01/26/2015 0047   UROBILINOGEN 0.2 01/26/2015 0047   NITRITE NEGATIVE 01/26/2015 0047   LEUKOCYTESUR NEGATIVE 01/26/2015 0047   Sepsis Labs: '@LABRCNTIP'$ (procalcitonin:4,lacticidven:4)  ) Recent Results (from the past 240 hour(s))  Surgical pcr screen     Status: Abnormal   Collection Time: 12/19/15  3:15 PM  Result Value Ref Range Status   MRSA, PCR NEGATIVE NEGATIVE Final   Staphylococcus aureus POSITIVE (A) NEGATIVE Final    Comment:        The Xpert SA Assay (FDA approved for NASAL specimens in patients over 3 years of age), is one component of a comprehensive surveillance program.  Test performance has been validated  by [facility] for patients greater than or equal to 10 year old. It is not intended to diagnose infection nor to guide or monitor treatment.   Culture, blood (routine x 2) Call MD if unable to obtain prior to antibiotics being given     Status: None (Preliminary result)   Collection Time: 12/21/15 10:37 PM  Result Value Ref Range Status   Specimen Description BLOOD RIGHT HAND  Final   Special Requests BOTTLES DRAWN AEROBIC AND ANAEROBIC 6CC  Final   Culture NO GROWTH 2 DAYS  Final   Report Status PENDING  Incomplete  Culture, blood (routine x 2) Call MD if unable to obtain prior to antibiotics being given     Status: None (Preliminary result)  Collection Time: 12/21/15 10:39 PM  Result Value Ref Range Status   Specimen Description BLOOD RIGHT HAND  Final   Special Requests BOTTLES DRAWN AEROBIC AND ANAEROBIC 6CC  Final   Culture NO GROWTH 2 DAYS  Final   Report Status PENDING  Incomplete         Radiology Studies: Dg Chest 1 View  Result Date: 12/23/2015 CLINICAL DATA:  Thoracentesis. EXAM: CHEST 1 VIEW COMPARISON:  12/21/2015 FINDINGS: Minimal residual right pleural fluid. No visible apical pneumothorax. Right infrahilar atelectasis. No suspected re-expansion edema. Large left pleural effusion. Stable heart size and mediastinal contours. IMPRESSION: 1. No acute finding after right thoracentesis. Minimal right pleural fluid remains. 2. Large left pleural effusion. Electronically Signed   By: Monte Fantasia M.D.   On: 12/23/2015 13:58   Dg Chest 2 View  Result Date: 12/21/2015 CLINICAL DATA:  Shortness of breath, lung cancer EXAM: CHEST  2 VIEW COMPARISON:  12/19/2015 FINDINGS: Medial right upper lobe mass, corresponding to known lung cancer. Patchy left lower lobe opacity, atelectasis versus pneumonia. Patchy right lower lobe opacity, likely atelectasis. Small bilateral pleural effusions.  No pneumothorax. The heart is normal in size IMPRESSION: Medial right upper lobe mass,  corresponding to known lung cancer. Patchy left lower lobe opacity, atelectasis versus pneumonia. Right lower lobe atelectasis. Small bilateral pleural effusions. Electronically Signed   By: Julian Hy M.D.   On: 12/21/2015 19:09   Ct Angio Chest Pe W Or Wo Contrast  Result Date: 12/22/2015 CLINICAL DATA:  Dyspnea and shortness of breath for the past 3 days. EXAM: CT ANGIOGRAPHY CHEST WITH CONTRAST TECHNIQUE: Multidetector CT imaging of the chest was performed using the standard protocol during bolus administration of intravenous contrast. Multiplanar CT image reconstructions and MIPs were obtained to evaluate the vascular anatomy. CONTRAST:  100 cc Isovue 370 COMPARISON:  Chest radiographs obtained yesterday. PET-CT dated 11/20/2015 and chest CT dated 11/10/2015. Stage IV right upper lobe lung carcinoma. FINDINGS: Cardiovascular: Satisfactory opacification of the pulmonary arteries to the segmental level. No evidence of pulmonary embolism. Normal heart size. Small pericardial effusion with a significant decrease in size since 11/10/2015, currently 6 mm in maximum thickness. This previously measured 18 mm in maximum thickness. Mediastinum/Nodes: Bilateral enlarged hilar lymph nodes are again demonstrated. The previously demonstrated 14 mm short axis right hilar node continues to measure 14 mm in maximum diameter on image number 51 of series 4. A 14 mm short axis left hilar node measures 15 mm in corresponding diameter today on image number 47 of series 4. No enlarged mediastinal nodes. Lungs/Pleura: Moderately large bilateral pleural effusions with a significant increase in amount. Bilateral lower lobe atelectasis. The previously demonstrated 6.8 x 3.2 cm right upper lobe mass currently measures 5.3 x 2.3 cm in corresponding dimensions. The previously demonstrated 1.0 cm right upper lobe nodule measures 1.0 cm on image number 38 of series 6. There is an interval nodular density in the left upper lobe  measuring 8 mm in maximum diameter on image number 50. Upper Abdomen: Progressive mild soft tissue density in the anterior peritoneal fat, anterior to the liver. Small amount of upper abdominal ascites. Mild diffuse abdominal subcutaneous edema. Musculoskeletal: Lytic and sclerotic lesions in the T1 and T2 vertebral bodies are again demonstrated with little change. No interval collapse. There is also patchy sclerosis in the C6 and C7 vertebral bodies with Schmorl's node formation, most likely degenerative. There are similar changes on both sides of the right sternoclavicular joint with bilateral sternoclavicular joint  degenerative changes noted. Review of the MIP images confirms the above findings. IMPRESSION: 1. No pulmonary emboli. 2. Moderately large bilateral pleural effusions, significantly increased. 3. Bilateral lower lobe atelectasis. 4. Mild decrease in size of the right upper lobe lung carcinoma with vascular encasement. 5. Stable 1 cm irregular right upper lobe nodule, suspicious for a possible metastasis. 6. Interval 8 mm left upper lobe nodule. This could represent a metastasis or small area of focal consolidation. 7. Edema and mild nodularity in the anterior peritoneal fat anterior to the liver. This could be due to anasarca or developing peritoneal metastatic disease. 8. Diffuse subcutaneous edema at the level of the abdomen. 9. 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 10. Small pericardial effusion with a significant decrease in size. Electronically Signed   By: Claudie Revering M.D.   On: 12/22/2015 12:36   US Venous Img Upper Uni Left  Result Date: 12/23/2015 CLINICAL DATA:  Left upper extremity edema for 2 weeks. EXAM: Left UPPER EXTREMITY VENOUS DOPPLER ULTRASOUND TECHNIQUE: Gray-scale sonography with graded compression, as well as color Doppler and duplex ultrasound were performed to evaluate the upper extremity deep venous system from the level of the subclavian vein and including the jugular,  axillary, basilic, radial, ulnar and upper cephalic vein. Spectral Doppler was utilized to evaluate flow at rest and with distal augmentation maneuvers. COMPARISON:  None. FINDINGS: Contralateral Subclavian Vein: Respiratory phasicity is normal and symmetric with the symptomatic side. No evidence of thrombus. Normal compressibility. Internal Jugular Vein: Nonocclusive thrombus is noted with some flow seen on Doppler. Subclavian Vein: No evidence of thrombus. Normal compressibility, respiratory phasicity and response to augmentation. Axillary Vein: No evidence of thrombus. Normal compressibility, respiratory phasicity and response to augmentation. Cephalic Vein: Occlusive thrombus is noted. Basilic Vein: No evidence of thrombus. Normal compressibility, respiratory phasicity and response to augmentation. Brachial Veins: No evidence of thrombus. Normal compressibility, respiratory phasicity and response to augmentation. Radial Veins: No evidence of thrombus. Normal compressibility, respiratory phasicity and response to augmentation. Ulnar Veins: No evidence of thrombus. Normal compressibility, respiratory phasicity and response to augmentation. Venous Reflux:  None visualized. Other Findings:  None visualized. IMPRESSION: Nonocclusive thrombus is noted in the left internal jugular vein. Occlusive thrombus is noted in the left cephalic vein. These results will be called to the ordering clinician or representative by the Radiologist Assistant, and communication documented in the PACS or zVision Dashboard. Electronically Signed   By: Marijo Conception, M.D.   On: 12/23/2015 14:42   US Thoracentesis Asp Pleural Space W/img Guide  Result Date: 12/23/2015 INDICATION: Shortness of breath. History of lung cancer with malignant pleural effusion. EXAM: ULTRASOUND GUIDED RIGHT THORACENTESIS MEDICATIONS: None. COMPLICATIONS: None immediate. PROCEDURE: An ultrasound guided thoracentesis was thoroughly discussed with the patient  and questions answered. The benefits, risks, alternatives and complications were also discussed. The patient understands and wishes to proceed with the procedure. Written consent was obtained. Ultrasound was performed to localize and mark an adequate pocket of fluid in the RIGHT chest. The area was then prepped and draped in the normal sterile fashion. 1% Lidocaine was used for local anesthesia. Under ultrasound guidance a 19 gauge, 7-cm, Yueh catheter was introduced. Thoracentesis was performed. The catheter was removed and a dressing applied. FINDINGS: A total of approximately 1.5 L of OPAQUE RED TINGED fluid was removed. Samples were sent to the laboratory as requested by the clinical team. IMPRESSION: Successful ultrasound guided right thoracentesis yielding 1.5 L of exudative pleural fluid. Electronically Signed   By:  Monte Fantasia M.D.   On: 12/23/2015 13:56        Scheduled Meds: . aspirin  81 mg Oral Daily  . atorvastatin  40 mg Oral q1800  . azithromycin  500 mg Intravenous Q24H  . cefTRIAXone (ROCEPHIN)  IV  1 g Intravenous Q24H  . fenofibrate  108 mg Oral Daily  . gabapentin  100 mg Oral TID  . heparin subcutaneous  5,000 Units Subcutaneous Q8H  . hydrochlorothiazide  25 mg Oral Daily  . insulin aspart  0-9 Units Subcutaneous TID WC  . insulin detemir  20 Units Subcutaneous QHS  . irbesartan  150 mg Oral Daily  . levothyroxine  25 mcg Oral QAC breakfast  . mouth rinse  15 mL Mouth Rinse BID  . meloxicam  7.5 mg Oral Daily  . metoprolol tartrate  12.5 mg Oral BID  . sodium chloride flush  3 mL Intravenous Q12H   Continuous Infusions:    LOS: 2 days    Time spent: 25 minutes. Greater than 50% of this time was spent in direct contact with the patient coordinating care.     Lelon Frohlich, MD [facility] Pager [phone]  If 7PM-7AM, please contact night-coverage [email or web address] Password [facility] [place] 12/23/2015, 4:43 PM

## 2015-12-23 NOTE — Care Management Note (Signed)
Case Management Note  Patient Details  Name: Cameron Chandler MRN: [id number] Date of Birth: 07/17/1955  Subjective/Objective:                  Pt is from home, lives with his daughter [name] [phone]). He reports being ind with ADL's at baseline. He reports having appointment at the [facility] cancer center on 12/24/2015 and then going to have pleurex drain placed on 12/25/2015. Now that he is admitted here, drain will be placed this admission. Anticipate pt will need [facility] nursing for drain education at DC. Pt request that his DC plans also be discussed with his daughter because 'she can explain things better than I can". CM assured pt we will discuss DC plan in more detail once we know more about plan and what he will have in place and/or need after DC.   Action/Plan: Will cont to follow.   Expected Discharge Date:    12/25/2015              Expected Discharge Plan:  [place]  In-House Referral:  NA  Discharge planning Services     Post Acute Care Choice:  NA Choice offered to:  NA  DME Arranged:    DME Agency:     HH Arranged:    HH Agency:     Status of Service:  In process, will continue to follow  If discussed at Long Length of Stay Meetings, dates discussed:    Additional Comments:  Sherald Barge, RN 12/23/2015, 12:47 PM

## 2015-12-23 NOTE — Progress Notes (Signed)
Pharmacy Antibiotic Note  Cameron Chandler is a 60 y.o. male admitted on 12/21/2015 with pneumonia.  Pharmacy has been consulted for Renal dose adjustment dosing.  Plan: Continue Azithromycin 500 mg IV every 24 hours Continue Ceftriaxone 1 GM IV every 24 hours No renal adjustment necessary, sign off.  Height: '5\' 6"'$  (167.6 cm) Weight: 207 lb 6.4 oz (94.1 kg) IBW/kg (Calculated) : 63.8  Temp (24hrs), Avg:98 F (36.7 C), Min:97.7 F (36.5 C), Max:98.5 F (36.9 C)   Recent Labs Lab 12/19/15 1434 12/21/15 1928 12/22/15 0843 12/23/15 0510  WBC 10.3 12.5* 12.2* 12.0*  CREATININE 0.96 0.82 0.85 0.77    Estimated Creatinine Clearance: 102.8 mL/min (by C-G formula based on SCr of 0.77 mg/dL).    No Known Allergies  Antimicrobials this admission: Ceftriaxone 9/23 >>  Azithromycin  9/24 >>  Levaquin 9/23 x 1 dose ED  Dose adjustments this admission:    Thank you for allowing pharmacy to be a part of this patient's care.  Romar, [place] 12/23/2015 11:22 AM

## 2015-12-23 NOTE — Progress Notes (Signed)
Thoracentesis complete no signs of distress. 1500 ml cloudy pleural fluid removed.

## 2015-12-23 NOTE — Care Management Important Message (Signed)
Important Message  Patient Details  Name: Cameron Chandler MRN: [id number] Date of Birth: 1955/12/31   Medicare Important Message Given:  Yes    Sherald Barge, RN 12/23/2015, 12:43 PM

## 2015-12-24 ENCOUNTER — Other Ambulatory Visit: Payer: Medicare HMO

## 2015-12-24 ENCOUNTER — Other Ambulatory Visit: Payer: Self-pay | Admitting: *Deleted

## 2015-12-24 ENCOUNTER — Ambulatory Visit: Payer: Medicare HMO | Admitting: Internal Medicine

## 2015-12-24 ENCOUNTER — Telehealth: Payer: Self-pay | Admitting: Internal Medicine

## 2015-12-24 LAB — GLUCOSE, CAPILLARY
GLUCOSE-CAPILLARY: 123 mg/dL — AB (ref 65–99)
Glucose-Capillary: 60 mg/dL — ABNORMAL LOW (ref 65–99)
Glucose-Capillary: 64 mg/dL — ABNORMAL LOW (ref 65–99)

## 2015-12-24 MED ORDER — AZITHROMYCIN 250 MG PO TABS: 500.0000 mg | ORAL_TABLET | Freq: Every day | ORAL | Status: DC

## 2015-12-24 NOTE — Care Management Note (Signed)
Case Management Note  Patient Details  Name: Cameron Chandler MRN: [id number] Date of Birth: 18-Sep-1955  Expected Discharge Date:  12/24/15               Expected Discharge Plan:  Home/Self Care  In-House Referral:  NA  Discharge planning Services  CM Consult  Post Acute Care Choice:  NA Choice offered to:  NA  DME Arranged:    DME Agency:     HH Arranged:    [place] Agency:     Status of Service:  Completed, signed off  If discussed at [employer] of Stay Meetings, dates discussed:    Additional Comments: Pt discharged home today with self care. Pt will f/u at [facility] next week for catheter placement. Pt states his daughter will be able to drain his effusions. They already have a plan. She works in the [employer], medical. Pt has no needs at this time.   Sherald Barge, RN 12/24/2015, 2:16 PM

## 2015-12-24 NOTE — Telephone Encounter (Signed)
09/26 Appointments rescheduled to 10/04 Per patient request.

## 2015-12-24 NOTE — Progress Notes (Signed)
Pt's IV catheter removed and intact. Pt's IV site clean dry and intact. Discharge instructions including medications and follow up appointments were reviewed and discussed with patient's wife. All questions were answered and no further questions at this time. Pt's wife verbalized understanding of discharge instructions including medications and follow up appointments. Pt in stable condition and in no acute distress at time of discharge. Pt escorted by nurse tech.

## 2015-12-24 NOTE — Discharge Summary (Signed)
Physician Discharge Summary  [name] EYC:[id number] DOB: 02/17/1956 DOA: 12/21/2015  PCP: [name], MD  Admit date: 12/21/2015 Discharge date: 12/24/2015  Time spent: 45 minutes  Recommendations for Outpatient Follow-up:  -Will be discharged home today. -Has appointment 9/27 for placement of pleurex catheter for recurrent right malignant pleural effusion. -Has a RUE DVT and will need to be anticoagulated. This has not been started given imminent plans for pleurex placement tomorrow.   Discharge Diagnoses:  Principal Problem:   CAP (community acquired pneumonia) Active Problems:   Essential hypertension   CAD, NATIVE VESSEL   Diabetes mellitus without complication (HCC)   Pleural effusion, bilateral   Hypothyroidism, acquired   Adenocarcinoma of right lung, stage 4 (HCC)   Acute respiratory failure with hypoxia (HCC)   Thrombocytosis (HCC)   Leukocytosis   Lung cancer (HCC)   Hypocalcemia   Acute on chronic diastolic CHF (congestive heart failure) (HCC)   DVT of upper extremity (deep vein thrombosis) ([place])   Discharge Condition: Stable and improved  Filed Weights   12/22/15 0553 12/23/15 0647 12/24/15 0507  Weight: 93 kg (205 lb 1.6 oz) 94.1 kg (207 lb 6.4 oz) 95 kg (209 lb 8 oz)    History of present illness:  As per Dr. Myna Hidalgo on 9/23: Cameron Chandler is a medically-complex 60 y.o. male with medical history significant for non-small cell lung cancer with Chandler, chronic diastolic CHF, insulin-dependent diabetes mellitus, hypertension, coronary artery disease, and recently diagnosed left upper extremity DVT not on anticoagulation due to planned Pleurx catheter placement who now presents the emergency department with acute worsening in his dyspnea. Patient was diagnosed with metastatic No carcinoma of the lung after a chest mass was identified on chest x-ray in August 2017. He has pleural effusions, with the right side being drained twice last month,  confirmed malignant on cytology, and with plans for upcoming Pleurx catheter placement on the right side. He was diagnosed with a left upper extremity DVT last week by his primary care physician, started on Coumadin, but only took 2 doses (last dose on 12/18/2015) as the chest surgeon instructed him to discontinue in preparation for the Pleurx catheter placement. He has been dyspneic with exertion for more than 2 months now, but experienced acute worsening yesterday and today was dyspneic at rest. He denies fevers, chills, or sweats associated with this. He reports a cough which is productive of sputum, but he reports swallowing it and has not actually seen the sputum. Over the past couple weeks, he reports development of marked bilateral lower extremity edema. With significant symptoms at rest tonight, he activated EMS for transport to the hospital. 125 mg IV Solu-Medrol was administered en route.  ED Course: Upon arrival to the ED, patient is found to be afebrile, saturating well on 5 L per minute of supplemental oxygen, tachypneic with respiratory rate in the high 20s, tachycardic with heart rate below 100 100s, and hypertensive to 150/107. EKG features a sinus tachycardia with low voltage QRS and chest x-rays notable for the known right upper lobe mass as well as new patchy left lower lobe opacity which is felt to represent atelectasis versus pneumonia. Small bilateral pleural effusions are also noted on the chest x-ray. CMP features a and 9 Of 1, albumin 1.9, and serum calcium of 7.0. CBC features a leukocytosis to 12,500 and thrombocytosis with platelet count 402,000. Patient was treated with Levaquin in the emergency department and CT of the chest was ordered, but  unfortunately after the contrast was administered, patient was noted to not tolerate lying flat and the scan could not be performed. Tachypnea improved, but tachycardia and hypoxia persisted in the emergency department. Patient will be admitted  to the telemetry unit for ongoing evaluation and management of his acute hypoxic respiratory failure which is felt to be multifactorial.   Hospital Course:   Acute Hypoxemic Resp Failure -CT chest negative for PE, large bilateral pleural effusions. No evidence for PNA. -IR has performed thoracentesis on 9/25 with removal of 1.5 L. -Has appointment on Wednesday 9/27 for placement of pleurex catheter with CT surgery.  DVT -Hold anticoagulation until after pleurex placement.  Lung cancer -F/u OP with oncology as scheduled. -Pleurex to drain effusions.  HTN -Well controlled. -Continue home medications.  DM -Fair control, continue current management.  Hypothyroidism -Continue synthroid.    Procedures:  Right thoracentesis 9/25 yielding 1500 ml.   Consultations:  IR  Discharge Instructions  Discharge Instructions    Diet - low sodium heart healthy    Complete by:  As directed    Increase activity slowly    Complete by:  As directed        Medication List    TAKE these medications   aspirin EC 81 MG tablet Take 81 mg by mouth daily.   Fenofibric Acid 105 MG Tabs Take 105 mg by mouth daily.   gabapentin 100 MG capsule Commonly known as:  NEURONTIN Take 1 capsule (100 mg total) by mouth 3 (three) times daily.   hydrochlorothiazide 25 MG tablet Commonly known as:  HYDRODIURIL Take 25 mg by mouth daily.   insulin detemir 100 unit/ml Soln Commonly known as:  LEVEMIR Inject 30 Units into the skin at bedtime.   levothyroxine 25 MCG tablet Commonly known as:  SYNTHROID, LEVOTHROID Take 1 tablet (25 mcg total) by mouth daily before breakfast.   meloxicam 7.5 MG tablet Commonly known as:  MOBIC Take 7.5 mg by mouth daily.   metFORMIN 500 MG tablet Commonly known as:  GLUCOPHAGE Take 500 mg by mouth 2 (two) times daily with a meal.   metoprolol tartrate 25 MG tablet Commonly known as:  LOPRESSOR Take 12.5 mg by mouth 2 (two) times daily.     nitroGLYCERIN 0.4 MG SL tablet Commonly known as:  NITROSTAT Place 0.4 mg under the tongue every 5 (five) minutes as needed for chest pain.   simvastatin 80 MG tablet Commonly known as:  ZOCOR Take 80 mg by mouth at bedtime.   valsartan 160 MG tablet Commonly known as:  DIOVAN Take 1 tablet (160 mg total) by mouth daily.      No Known Allergies Follow-up Information    [name], MD. Schedule an appointment as soon as possible for a visit in 2 weeks.   Specialty:  Internal Medicine Contact information: [place] [address] [phone]            The results of significant diagnostics from this hospitalization (including imaging, microbiology, ancillary and laboratory) are listed below for reference.    Significant Diagnostic Studies: Dg Chest 1 View  Result Date: 12/23/2015 CLINICAL DATA:  Thoracentesis. EXAM: CHEST 1 VIEW COMPARISON:  12/21/2015 FINDINGS: Minimal residual right pleural fluid. No visible apical pneumothorax. Right infrahilar atelectasis. No suspected re-expansion edema. Large left pleural effusion. Stable heart size and mediastinal contours. IMPRESSION: 1. No acute finding after right thoracentesis. Minimal right pleural fluid remains. 2. Large left pleural effusion. Electronically Signed  By: Monte Fantasia M.D.   On: 12/23/2015 13:58   Dg Chest 1 View  Result Date: 11/28/2015 CLINICAL DATA:  Post RIGHT thoracentesis, history coronary disease post MI, diabetes mellitus, colon cancer, prostate cancer EXAM: CHEST 1 VIEW COMPARISON:  11/21/2015 FINDINGS: Normal heart size and pulmonary vascularity. Atherosclerotic calcification and mild tortuosity of thoracic aorta. Small bibasilar pleural effusions and atelectasis, greater on LEFT. No pneumothorax post thoracentesis. Upper lungs clear. Degenerative disc disease changes thoracic spine. IMPRESSION: No pneumothorax following thoracentesis. Small bibasilar pleural effusions and  atelectasis, greater on LEFT. Electronically Signed   By: Lavonia Dana M.D.   On: 11/28/2015 14:10   Dg Chest 2 View  Result Date: 12/21/2015 CLINICAL DATA:  Shortness of breath, lung cancer EXAM: CHEST  2 VIEW COMPARISON:  12/19/2015 FINDINGS: Medial right upper lobe mass, corresponding to known lung cancer. Patchy left lower lobe opacity, atelectasis versus pneumonia. Patchy right lower lobe opacity, likely atelectasis. Small bilateral pleural effusions.  No pneumothorax. The heart is normal in size IMPRESSION: Medial right upper lobe mass, corresponding to known lung cancer. Patchy left lower lobe opacity, atelectasis versus pneumonia. Right lower lobe atelectasis. Small bilateral pleural effusions. Electronically Signed   By: Julian Hy M.D.   On: 12/21/2015 19:09   Dg Chest 2 View  Result Date: 12/19/2015 CLINICAL DATA:  Recurrent right pleural effusion. Scheduled for placement of pleural drainage catheter. EXAM: CHEST  2 VIEW COMPARISON:  12/10/2015 FINDINGS: There are persistent densities at both lung bases. Findings are compatible with bilateral small pleural effusions. Slightly increased densities at the left lung base may represent atelectasis. Negative for pneumothorax. Heart is obscured by the basilar chest densities. Again noted is a surgical plate in the cervical spine. No evidence for pulmonary edema. IMPRESSION: Persistent small bilateral pleural effusions with atelectasis. Minimal change from 12/10/2015. Electronically Signed   By: Markus Daft M.D.   On: 12/19/2015 16:11   Dg Chest 2 View  Result Date: 12/10/2015 CLINICAL DATA:  Shortness of breath for several days, history of right pleural effusion EXAM: CHEST  2 VIEW COMPARISON:  Chest x-ray of 11/29/2015 FINDINGS: There are small bilateral pleural effusions present with bibasilar opacities most consistent with atelectasis. Pneumonia cannot be excluded and followup is recommended. In addition, there is opacity in the medial right  upper lung which corresponds to the abnormal opacity noted on CT the chest medially in the right upper lobe. Continued followup is recommended. The heart is within normal limits in size. No bony abnormality is seen. IMPRESSION: 1. Small bilateral pleural effusions may have increased slightly in volume. Increase in bibasilar atelectasis. 2. Persistent opacity medially in the right upper lobe as noted on recent CT chest scan. Recommend continued followup. Electronically Signed   By: Ivar Drape M.D.   On: 12/10/2015 16:55   Ct Angio Chest Pe W Or Wo Contrast  Result Date: 12/22/2015 CLINICAL DATA:  Dyspnea and shortness of breath for the past 3 days. EXAM: CT ANGIOGRAPHY CHEST WITH CONTRAST TECHNIQUE: Multidetector CT imaging of the chest was performed using the standard protocol during bolus administration of intravenous contrast. Multiplanar CT image reconstructions and MIPs were obtained to evaluate the vascular anatomy. CONTRAST:  100 cc Isovue 370 COMPARISON:  Chest radiographs obtained yesterday. PET-CT dated 11/20/2015 and chest CT dated 11/10/2015. Stage IV right upper lobe lung carcinoma. FINDINGS: Cardiovascular: Satisfactory opacification of the pulmonary arteries to the segmental level. No evidence of pulmonary embolism. Normal heart size. Small pericardial effusion with a significant decrease  in size since 11/10/2015, currently 6 mm in maximum thickness. This previously measured 18 mm in maximum thickness. Mediastinum/Nodes: Bilateral enlarged hilar lymph nodes are again demonstrated. The previously demonstrated 14 mm short axis right hilar node continues to measure 14 mm in maximum diameter on image number 51 of series 4. A 14 mm short axis left hilar node measures 15 mm in corresponding diameter today on image number 47 of series 4. No enlarged mediastinal nodes. Lungs/Pleura: Moderately large bilateral pleural effusions with a significant increase in amount. Bilateral lower lobe atelectasis. The  previously demonstrated 6.8 x 3.2 cm right upper lobe mass currently measures 5.3 x 2.3 cm in corresponding dimensions. The previously demonstrated 1.0 cm right upper lobe nodule measures 1.0 cm on image number 38 of series 6. There is an interval nodular density in the left upper lobe measuring 8 mm in maximum diameter on image number 50. Upper Abdomen: Progressive mild soft tissue density in the anterior peritoneal fat, anterior to the liver. Small amount of upper abdominal ascites. Mild diffuse abdominal subcutaneous edema. Musculoskeletal: Lytic and sclerotic lesions in the T1 and T2 vertebral bodies are again demonstrated with little change. No interval collapse. There is also patchy sclerosis in the C6 and C7 vertebral bodies with Schmorl's node formation, most likely degenerative. There are similar changes on both sides of the right sternoclavicular joint with bilateral sternoclavicular joint degenerative changes noted. Review of the MIP images confirms the above findings. IMPRESSION: 1. No pulmonary emboli. 2. Moderately large bilateral pleural effusions, significantly increased. 3. Bilateral lower lobe atelectasis. 4. Mild decrease in size of the right upper lobe lung carcinoma with vascular encasement. 5. Stable 1 cm irregular right upper lobe nodule, suspicious for a possible Chandler. 6. Interval 8 mm left upper lobe nodule. This could represent a Chandler or small area of focal consolidation. 7. Edema and mild nodularity in the anterior peritoneal fat anterior to the liver. This could be due to anasarca or developing peritoneal metastatic disease. 8. Diffuse subcutaneous edema at the level of the abdomen. 9. 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 10. Small pericardial effusion with a significant decrease in size. Electronically Signed   By: Claudie Revering M.D.   On: 12/22/2015 12:36   Mr Jeri Cos [place] Contrast  Result Date: 12/13/2015 CLINICAL DATA:  Weakness for 2 weeks, lung and prostate cancer.  EXAM: MRI HEAD WITHOUT AND WITH CONTRAST TECHNIQUE: Multiplanar, multiecho pulse sequences of the brain and surrounding structures were obtained without and with intravenous contrast. CONTRAST:  93m MULTIHANCE GADOBENATE DIMEGLUMINE 529 MG/ML IV SOLN COMPARISON:  MRI brain 01/26/2015. FINDINGS: Brain: No acute infarction, hemorrhage, hydrocephalus, extra-axial collection or mass lesion. Premature for age atrophy. Chronic microvascular ischemic change. Vascular: Normal flow voids. Skull and upper cervical spine: Calvarium appears intact. Susceptibility artifact from previous C3-4 fusion. Low signal intensity bone marrow C2 vertebrae, interval change from priors, could represent metastatic disease. No epidural tumor. Sinuses/Orbits: Negative. Other: None. IMPRESSION: No intracranial metastatic disease is evident. Atrophy and small vessel disease similar to priors. Low signal intensity of the bone marrow at C2. Metastatic prostate cancer not excluded. Electronically Signed   By: JStaci RighterM.D.   On: 12/13/2015 21:38   [place] Img Upper Uni Left  Result Date: 12/23/2015 CLINICAL DATA:  Left upper extremity edema for 2 weeks. EXAM: Left UPPER EXTREMITY VENOUS DOPPLER ULTRASOUND TECHNIQUE: Gray-scale sonography with graded compression, as well as color Doppler and duplex ultrasound were performed to evaluate the upper extremity deep venous  system from the level of the subclavian vein and including the jugular, axillary, basilic, radial, ulnar and upper cephalic vein. Spectral Doppler was utilized to evaluate flow at rest and with distal augmentation maneuvers. COMPARISON:  None. FINDINGS: Contralateral Subclavian Vein: Respiratory phasicity is normal and symmetric with the symptomatic side. No evidence of thrombus. Normal compressibility. Internal Jugular Vein: Nonocclusive thrombus is noted with some flow seen on Doppler. Subclavian Vein: No evidence of thrombus. Normal compressibility, respiratory phasicity  and response to augmentation. Axillary Vein: No evidence of thrombus. Normal compressibility, respiratory phasicity and response to augmentation. Cephalic Vein: Occlusive thrombus is noted. Basilic Vein: No evidence of thrombus. Normal compressibility, respiratory phasicity and response to augmentation. Brachial Veins: No evidence of thrombus. Normal compressibility, respiratory phasicity and response to augmentation. Radial Veins: No evidence of thrombus. Normal compressibility, respiratory phasicity and response to augmentation. Ulnar Veins: No evidence of thrombus. Normal compressibility, respiratory phasicity and response to augmentation. Venous Reflux:  None visualized. Other Findings:  None visualized. IMPRESSION: Nonocclusive thrombus is noted in the left internal jugular vein. Occlusive thrombus is noted in the left cephalic vein. These results will be called to the ordering clinician or representative by the Radiologist Assistant, and communication documented in the PACS or zVision Dashboard. Electronically Signed   By: Marijo Conception, M.D.   On: 12/23/2015 14:42   US Thoracentesis Asp Pleural Space W/img Guide  Result Date: 12/23/2015 INDICATION: Shortness of breath. History of lung cancer with malignant pleural effusion. EXAM: ULTRASOUND GUIDED RIGHT THORACENTESIS MEDICATIONS: None. COMPLICATIONS: None immediate. PROCEDURE: An ultrasound guided thoracentesis was thoroughly discussed with the patient and questions answered. The benefits, risks, alternatives and complications were also discussed. The patient understands and wishes to proceed with the procedure. Written consent was obtained. Ultrasound was performed to localize and mark an adequate pocket of fluid in the RIGHT chest. The area was then prepped and draped in the normal sterile fashion. 1% Lidocaine was used for local anesthesia. Under ultrasound guidance a 19 gauge, 7-cm, Yueh catheter was introduced. Thoracentesis was performed. The  catheter was removed and a dressing applied. FINDINGS: A total of approximately 1.5 L of OPAQUE RED TINGED fluid was removed. Samples were sent to the laboratory as requested by the clinical team. IMPRESSION: Successful ultrasound guided right thoracentesis yielding 1.5 L of exudative pleural fluid. Electronically Signed   By: Monte Fantasia M.D.   On: 12/23/2015 13:56   US Thoracentesis Asp Pleural Space W/img Guide  Result Date: 11/28/2015 INDICATION: Patient with prior history of colon and prostate cancers, smoker, new right lung mass, bilateral pleural effusions right greater than left, prior right thoracentesis revealing adenocarcinoma. Presents again today for diagnostic and therapeutic right thoracentesis. EXAM: ULTRASOUND GUIDED DIAGNOSTIC AND THERAPEUTIC RIGHT THORACENTESIS MEDICATIONS: None. COMPLICATIONS: None immediate. PROCEDURE: An ultrasound guided thoracentesis was thoroughly discussed with the patient and questions answered. The benefits, risks, alternatives and complications were also discussed. The patient understands and wishes to proceed with the procedure. Written consent was obtained. Ultrasound was performed to localize and mark an adequate pocket of fluid in the right chest. The area was then prepped and draped in the normal sterile fashion. 1% Lidocaine was used for local anesthesia. Under ultrasound guidance a Safe-T-Centesis catheter was introduced. Thoracentesis was performed. The catheter was removed and a dressing applied. FINDINGS: A total of approximately 1 liter of chylous/milky fluid was removed. Samples were sent to the laboratory as requested by the clinical team. IMPRESSION: Successful ultrasound guided diagnostic and therapeutic right thoracentesis yielding  1 liter of pleural fluid. Read by: Rowe Robert, PA-C Electronically Signed   By: Lucrezia Europe M.D.   On: 11/28/2015 16:00    Microbiology: Recent Results (from the past 240 hour(s))  Surgical pcr screen     Status:  Abnormal   Collection Time: 12/19/15  3:15 PM  Result Value Ref Range Status   MRSA, PCR NEGATIVE NEGATIVE Final   Staphylococcus aureus POSITIVE (A) NEGATIVE Final    Comment:        The Xpert SA Assay (FDA approved for NASAL specimens in patients over 66 years of age), is one component of a comprehensive surveillance program.  Test performance has been validated by [facility] for patients greater than or equal to 80 year old. It is not intended to diagnose infection nor to guide or monitor treatment.   Culture, blood (routine x 2) Call MD if unable to obtain prior to antibiotics being given     Status: None (Preliminary result)   Collection Time: 12/21/15 10:37 PM  Result Value Ref Range Status   Specimen Description BLOOD RIGHT HAND  Final   Special Requests BOTTLES DRAWN AEROBIC AND ANAEROBIC 6CC  Final   Culture NO GROWTH 3 DAYS  Final   Report Status PENDING  Incomplete  Culture, blood (routine x 2) Call MD if unable to obtain prior to antibiotics being given     Status: None (Preliminary result)   Collection Time: 12/21/15 10:39 PM  Result Value Ref Range Status   Specimen Description BLOOD RIGHT HAND  Final   Special Requests BOTTLES DRAWN AEROBIC AND ANAEROBIC 6CC  Final   Culture NO GROWTH 3 DAYS  Final   Report Status PENDING  Incomplete     Labs: Basic Metabolic Panel:  Recent Labs Lab 12/19/15 1434 12/21/15 1928 12/22/15 0843 12/23/15 0510  NA 142 138 136 137  K 4.2 4.0 4.3 3.8  CL 109 113* 109 106  CO2 '27 24 26 25  '$ GLUCOSE 77 99 120* 71  BUN '15 18 20 '$ 22*  CREATININE 0.96 0.82 0.85 0.77  CALCIUM 8.3* 7.0* 7.8* 8.1*   Liver Function Tests:  Recent Labs Lab 12/19/15 1434 12/21/15 1928  AST 20 24  ALT 22 23  ALKPHOS 70 66  BILITOT 0.3 0.2*  PROT 4.7* 5.1*  ALBUMIN 1.8* 1.9*   No results for input(s): LIPASE, AMYLASE in the last 168 hours. No results for input(s): AMMONIA in the last 168 hours. CBC:  Recent Labs Lab 12/19/15 1434  12/21/15 1928 12/22/15 0843 12/23/15 0510  WBC 10.3 12.5* 12.2* 12.0*  NEUTROABS  --  11.3*  --   --   HGB 14.5 14.3 13.5 13.3  HCT 45.8 43.4 41.2 41.4  MCV 81.8 78.6 79.8 79.6  PLT 398 402* 379 411*   Cardiac Enzymes:  Recent Labs Lab 12/21/15 1928 12/22/15 0843 12/22/15 1452  TROPONINI <0.03 <0.03 0.03*   BNP: BNP (last 3 results)  Recent Labs  12/21/15 1928  BNP 33.0    ProBNP (last 3 results)  Recent Labs  11/21/15 1641  PROBNP 25.0    CBG:  Recent Labs Lab 12/23/15 1649 12/23/15 2044 12/24/15 0741 12/24/15 0820 12/24/15 0900  GLUCAP 75 109* 60* 64* 123*       Signed:  HERNANDEZ ACOSTA,ESTELA  [facility] Pager: [phone] 12/24/2015, 10:39 AM

## 2015-12-25 ENCOUNTER — Ambulatory Visit (HOSPITAL_COMMUNITY)
Admission: RE | Admit: 2015-12-25 | Payer: Medicare HMO | Source: Ambulatory Visit | Admitting: Thoracic Surgery (Cardiothoracic Vascular Surgery)

## 2015-12-25 ENCOUNTER — Encounter (HOSPITAL_COMMUNITY): Admission: RE | Payer: Self-pay | Source: Ambulatory Visit

## 2015-12-25 SURGERY — INSERTION, PLEURAL DRAINAGE CATHETER
Anesthesia: Monitor Anesthesia Care | Laterality: Right

## 2015-12-26 LAB — CULTURE, BLOOD (ROUTINE X 2)
Culture: NO GROWTH
Culture: NO GROWTH

## 2015-12-26 NOTE — Progress Notes (Signed)
Thoracic Location of Tumor / Histology: Lung, Right Upper lobe, stage IV,  mets to T1,T2   Patient presented  months ago with symptoms of:   Cough, Shortness of breath on exertion and  C/o swelling in lower extremities saw  Primary MD and Chest x ray was done found mass  Right upper lobe ,scattered  Biopsies of (if applicable) revealed: Diagnosis 11/28/15: PLEURAL FLUID, RIGHT (SPECIMEN 1 OF 1 COLLECTED 11/28/15): MALIGNANT CELLS PRESENT, CONSISTENT WITH ADENOCARCINOMA.  Prostate biopsies 11/30/13 and 12/13/12  Tobacco/Marijuana/Snuff/ETOH use: 1ppd x 48 years  Current still, no hx alcohol or drug abuse  Past/Anticipated interventions by cardiothoracic surgery, if any: Dr. Melvyn Novas did a  Right Thoracentesis 11/21/15  863m pleural fluid (metastatic adenocarcinoma)11/28/15 right thoracentesis and 1 liter pleural fluid drained , Thoracentesis 12/23/15 with 1.5liter fluid removed ,  Dr. HWallene Huhto see patient Tuesday ,12/31/15 , Xrays at 315pm  Past/Anticipated interventions by medical oncology, if any:  Colon resection  2011   chemotherapy treated  under Dr. DJanae Saucein EPalmer Dr. MJulien Nordmann9/5/17, Referral  palliative   Radiation to T1 and T2, ordered MRI brain  12/13/14 at 6pm (light headed)  Referral for cardiothoracic surgery for consideration of Right Plerux catheter  Placement,  follow up 12/24/15=was cancelled, folow up Wednesday 01/02/16  Signs/Symptoms  Weight changes, if any:  Gained 3 lbs since d/c hospital 12/24/15  Respiratory complaints, if any: Shortness of breath  Hemoptysis, if any:  No  Pain: 8/17  Neck, shoulders,back, swelling in ;eft hand, abdomen, feet  SAFETY ISSUES: Yes,   Prior radiation? Yes,  2016  Prostate=`Dr. MTammi Klippelin [place] Pacemaker/ICD? NO Is the patient on methotrexate? NO Current Complaints / other details:  History Colon cancer ,2011 s/p 1 year off chemotherapy treatment under Dr. DJanae Saucein E[place] History prostate cancer  Under treatment  By his Urologist  With  hormonal therapy, MI,DM Mother throat cancer, living age 62,brothercolon cancer,  2nd brother colon and liver cancer, and sister oral cancer,   D/c Hospital 12/24/15  Admitted for acute respiratory failure , also has RUE DVT , CHF  Allergies:NKA BP (!) 89/61 (BP Location: Right Arm, Patient Position: Sitting, Cuff Size: Normal)   Pulse 78   Temp 97.8 F (36.6 C) (Oral)   Resp 20   Ht '5\' 6"'$  (1.676 m)   Wt 212 lb (96.2 kg)   SpO2 96% Comment: room air  BMI 34.22 kg/m   Wt Readings from Last 3 Encounters:  12/30/15 212 lb (96.2 kg)  12/24/15 209 lb 8 oz (95 kg)  12/19/15 203 lb 3 oz (92.2 kg)

## 2015-12-30 ENCOUNTER — Ambulatory Visit
Admission: RE | Admit: 2015-12-30 | Discharge: 2015-12-30 | Disposition: A | Discharge status: Home / Self Care | Payer: Medicare HMO | Source: Ambulatory Visit | Attending: Radiation Oncology | Admitting: Radiation Oncology

## 2015-12-30 ENCOUNTER — Other Ambulatory Visit: Payer: Self-pay | Admitting: Thoracic Surgery (Cardiothoracic Vascular Surgery)

## 2015-12-30 ENCOUNTER — Encounter: Payer: Self-pay | Admitting: Radiation Oncology

## 2015-12-30 VITALS — BP 89/61 | HR 78 | Temp 97.8°F | Resp 20 | Ht 66.0 in | Wt 212.0 lb

## 2015-12-30 DIAGNOSIS — C7952 Secondary malignant neoplasm of bone marrow: Secondary | ICD-10-CM

## 2015-12-30 DIAGNOSIS — Z800 Family history of malignant neoplasm of digestive organs: Secondary | ICD-10-CM | POA: Diagnosis not present

## 2015-12-30 DIAGNOSIS — Z923 Personal history of irradiation: Secondary | ICD-10-CM | POA: Diagnosis not present

## 2015-12-30 DIAGNOSIS — C3401 Malignant neoplasm of right main bronchus: Secondary | ICD-10-CM

## 2015-12-30 DIAGNOSIS — C3491 Malignant neoplasm of unspecified part of right bronchus or lung: Secondary | ICD-10-CM

## 2015-12-30 DIAGNOSIS — Z833 Family history of diabetes mellitus: Secondary | ICD-10-CM | POA: Diagnosis not present

## 2015-12-30 DIAGNOSIS — Z808 Family history of malignant neoplasm of other organs or systems: Secondary | ICD-10-CM | POA: Diagnosis not present

## 2015-12-30 DIAGNOSIS — I252 Old myocardial infarction: Secondary | ICD-10-CM | POA: Diagnosis not present

## 2015-12-30 DIAGNOSIS — C7951 Secondary malignant neoplasm of bone: Secondary | ICD-10-CM

## 2015-12-30 DIAGNOSIS — C3411 Malignant neoplasm of upper lobe, right bronchus or lung: Secondary | ICD-10-CM | POA: Diagnosis not present

## 2015-12-30 DIAGNOSIS — I2510 Atherosclerotic heart disease of native coronary artery without angina pectoris: Secondary | ICD-10-CM | POA: Diagnosis not present

## 2015-12-30 DIAGNOSIS — Z510 Encounter for antineoplastic radiation therapy: Secondary | ICD-10-CM | POA: Diagnosis present

## 2015-12-30 DIAGNOSIS — Z7901 Long term (current) use of anticoagulants: Secondary | ICD-10-CM | POA: Diagnosis not present

## 2015-12-30 DIAGNOSIS — Z7982 Long term (current) use of aspirin: Secondary | ICD-10-CM | POA: Diagnosis not present

## 2015-12-30 DIAGNOSIS — Z86718 Personal history of other venous thrombosis and embolism: Secondary | ICD-10-CM | POA: Diagnosis not present

## 2015-12-30 DIAGNOSIS — E119 Type 2 diabetes mellitus without complications: Secondary | ICD-10-CM | POA: Diagnosis not present

## 2015-12-30 DIAGNOSIS — M546 Pain in thoracic spine: Secondary | ICD-10-CM | POA: Diagnosis not present

## 2015-12-30 DIAGNOSIS — I10 Essential (primary) hypertension: Secondary | ICD-10-CM | POA: Diagnosis not present

## 2015-12-30 DIAGNOSIS — E785 Hyperlipidemia, unspecified: Secondary | ICD-10-CM | POA: Diagnosis not present

## 2015-12-30 DIAGNOSIS — Z79899 Other long term (current) drug therapy: Secondary | ICD-10-CM | POA: Diagnosis not present

## 2015-12-30 DIAGNOSIS — Z85038 Personal history of other malignant neoplasm of large intestine: Secondary | ICD-10-CM | POA: Diagnosis not present

## 2015-12-30 DIAGNOSIS — Z87891 Personal history of nicotine dependence: Secondary | ICD-10-CM | POA: Diagnosis not present

## 2015-12-30 DIAGNOSIS — Z8546 Personal history of malignant neoplasm of prostate: Secondary | ICD-10-CM | POA: Diagnosis not present

## 2015-12-30 DIAGNOSIS — Z794 Long term (current) use of insulin: Secondary | ICD-10-CM | POA: Diagnosis not present

## 2015-12-30 MED ORDER — OXYCODONE-ACETAMINOPHEN 5-325 MG PO TABS: 1.0000 | ORAL_TABLET | Freq: Four times a day (QID) | ORAL | 0 refills | Status: DC | PRN

## 2015-12-30 NOTE — Progress Notes (Signed)
Please see the Nurse Progress Note in the MD Initial Consult Encounter for this patient. 
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
## 2015-12-30 NOTE — Addendum Note (Signed)
Encounter addended by: Doreen Beam, RN on: 12/30/2015 10:49 AM<BR>    Actions taken: Visit diagnoses modified

## 2015-12-31 ENCOUNTER — Other Ambulatory Visit: Payer: Self-pay | Admitting: *Deleted

## 2015-12-31 ENCOUNTER — Encounter: Payer: Self-pay | Admitting: Thoracic Surgery (Cardiothoracic Vascular Surgery)

## 2015-12-31 ENCOUNTER — Ambulatory Visit (INDEPENDENT_AMBULATORY_CARE_PROVIDER_SITE_OTHER): Payer: Medicare HMO | Admitting: Thoracic Surgery (Cardiothoracic Vascular Surgery)

## 2015-12-31 ENCOUNTER — Other Ambulatory Visit: Payer: Self-pay | Admitting: Medical Oncology

## 2015-12-31 ENCOUNTER — Ambulatory Visit
Admission: RE | Admit: 2015-12-31 | Discharge: 2015-12-31 | Disposition: A | Discharge status: Home / Self Care | Payer: Medicare HMO | Source: Ambulatory Visit | Attending: Thoracic Surgery (Cardiothoracic Vascular Surgery) | Admitting: Thoracic Surgery (Cardiothoracic Vascular Surgery)

## 2015-12-31 VITALS — BP 94/65 | HR 86 | Resp 18 | Ht 66.0 in | Wt 206.0 lb

## 2015-12-31 DIAGNOSIS — C3491 Malignant neoplasm of unspecified part of right bronchus or lung: Secondary | ICD-10-CM

## 2015-12-31 DIAGNOSIS — J910 Malignant pleural effusion: Secondary | ICD-10-CM

## 2015-12-31 DIAGNOSIS — J90 Pleural effusion, not elsewhere classified: Secondary | ICD-10-CM

## 2015-12-31 MED ORDER — FUROSEMIDE 40 MG PO TABS: 40.0000 mg | ORAL_TABLET | Freq: Every day | ORAL | 1 refills | Status: AC

## 2015-12-31 MED ORDER — POTASSIUM CHLORIDE CRYS ER 10 MEQ PO TBCR: 10.0000 meq | EXTENDED_RELEASE_TABLET | Freq: Every day | ORAL | 1 refills | Status: DC

## 2015-12-31 NOTE — Progress Notes (Signed)
[address]       [place] [address]             [phone]       HPI: Cameron Chandler returns today for follow-up regarding his malignant right pleural effusion.  He is a 60 year old man recently diagnosed with stage IV lung cancer. He first presented with shortness of breath and leg swelling. Workup showed a right upper lobe mass. He had a thoracentesis of right pleural effusion draining a liter of fluid which provided symptomatically relief. 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 He's been drained about every week to 10 days since then.  He was scheduled for pleural catheter placement on 12/20/2015. However he was started on Coumadin for an internal jugular thrombus. We rescheduled the surgery for the 27th to give him time to be off the Coumadin, but unfortunately he became symptomatic and was admitted to [facility] on the 23rd. He had a thoracentesis done which again resulted in symptomatic relief.  He now presents to discuss placement pleural catheter. His breathing improved after thoracentesis and his primary complaint today is swelling in his lower extremities.  Past Medical History:  Diagnosis Date  . Adenocarcinoma of right lung, stage 4 ([place]) 12/03/2015  . Arthritis    back, joint pain  . Bone cancer ([place])    T 1T2  . Cancer [facility]) 2011   colon cancer  . Colon cancer [facility])    Colon resection and Chemotherapy  . Coronary atherosclerosis of native coronary artery   . Diabetes mellitus without complication ([facility])    Type II  . DVT (deep venous thrombosis) ([facility])    Left arm  . Heart attack   . Myocardial infarction   . Other and unspecified hyperlipidemia   . Prostate cancer ([place]) 12/13/12   ,Radiation  . Unspecified essential hypertension       Current Outpatient Prescriptions  Medication Sig Dispense Refill  . aspirin EC 81 MG tablet Take 81 mg by mouth daily.    . Fenofibric Acid 105 MG TABS Take 105 mg by mouth daily.     Marland Kitchen  gabapentin (NEURONTIN) 100 MG capsule Take 1 capsule (100 mg total) by mouth 3 (three) times daily. 90 capsule 0  . hydrochlorothiazide (HYDRODIURIL) 25 MG tablet Take 25 mg by mouth daily.    . insulin detemir (LEVEMIR) 100 unit/ml SOLN Inject 30 Units into the skin at bedtime.    Marland Kitchen levothyroxine (SYNTHROID, LEVOTHROID) 25 MCG tablet Take 1 tablet (25 mcg total) by mouth daily before breakfast. 30 tablet 11  . meloxicam (MOBIC) 7.5 MG tablet Take 7.5 mg by mouth daily.    . metFORMIN (GLUCOPHAGE) 500 MG tablet Take 500 mg by mouth 2 (two) times daily with a meal.    . metoprolol tartrate (LOPRESSOR) 25 MG tablet Take 12.5 mg by mouth 2 (two) times daily.    . nitroGLYCERIN (NITROSTAT) 0.4 MG SL tablet Place 0.4 mg under the tongue every 5 (five) minutes as needed for chest pain.    Marland Kitchen oxyCODONE-acetaminophen (PERCOCET/ROXICET) 5-325 MG tablet Take 1-2 tablets by mouth every 6 (six) hours as needed for severe pain. 60 tablet 0  . simvastatin (ZOCOR) 80 MG tablet Take 80 mg by mouth at bedtime.    . valsartan (DIOVAN) 160 MG tablet Take 1 tablet (160 mg total) by mouth daily. 30 tablet 11  . warfarin (COUMADIN) 5 MG tablet Take 5 mg by mouth daily. Or as directed  1  . furosemide (LASIX) 40 MG tablet Take 1 tablet (40 mg total) by mouth daily. 14 tablet 1  . potassium chloride (K-DUR,KLOR-CON) 10 MEQ tablet Take 1 tablet (10 mEq total) by mouth daily. 14 tablet 1   No current facility-administered medications for this visit.     Physical Exam BP 94/65   Pulse 86   Resp 18   Ht '5\' 6"'$  (1.676 m)   Wt 206 lb (93.4 kg)   SpO2 95% Comment: ON RA  BMI 33.40 kg/m  60 year old man in no acute distress Ill-appearing Alert and oriented 3 with no focal deficits Lungs with diminished breath sounds both bases left greater than right Cardiac regular rate and rhythm normal S1 and S2 Abdomen positive ascites Extremities 4+ lower extremity edema bilaterally  Diagnostic Tests: CHEST  2  VIEW  COMPARISON:  Chest CT 12/22/2015  FINDINGS: Cardiomediastinal silhouette is normal. Mediastinal contours appear intact.  There is which shaped right upper lobe opacity which corresponds to patient's known lung malignancy. There is an increasing left lower lobe airspace opacification, which may represent atelectasis versus acute infectious consolidation. There are bilateral pleural effusions.  Osseous structures are without acute abnormality. Soft tissues are grossly normal.  IMPRESSION: Increased left lower lobe airspace opacification, which may represent atelectasis versus infectious consolidation.  Persistent bilateral pleural effusions, left greater than right.  Known right upper lobe mass.   Electronically Signed   By: Fidela Salisbury M.D.   On: 12/31/2015 16:23 I personally reviewed his chest x-ray today. It shows minimal if any reaccumulation on the right. There is a moderate left effusion  Impression: 60 year old man with stage IV lung cancer who has a malignant right pleural effusion possibly malignant left pleural effusion as well. After thoracentesis of the right the left side actually larger at this point but is been relatively stable more so than the right side. He has been repeatedly getting symptomatic enough that a week to 2 weeks to need a repeat thoracentesis. The timing is tricky in his case because of Coumadin as he needs to be off that for several days prior to having a catheter placement. The only documentation I can find of venous thrombosis is the internal jugular vein which is nonocclusive and the cephalic vein on the left arm. There is no evidence of PE on his scan, therefore think Coumadin can safely be held prior to pleural catheter placement.  He understands the indications, risks, benefits, and alternatives. That is been discussed with him in detail previously.  We are going to plan to place a right pleural catheter on Thursday,  October 12. He will stop his Coumadin after his dose on Saturday the seventh. He can resume it again immediately after the procedure. He may ultimately need a left-sided catheter as well, we will have to keep an eye on that.  His primary concern currently is his 4+ edema  in both lower extremities there really is quite dramatic. I will start him on Lasix and potassium and recommended a follow-up with Dr. Sherrie Sport regarding that issue.   Plan: Lasix 40 mg daily, potassium 20 mEq daily Right pleural catheter placement on Thursday, October 12 We will have him get a PA and lateral chest x-ray Forestine Na on the 11th to make sure there is adequate fluid to place the pleural catheter.  Melrose Nakayama, MD [facility] [phone]

## 2016-01-01 ENCOUNTER — Telehealth: Payer: Self-pay | Admitting: *Deleted

## 2016-01-01 ENCOUNTER — Other Ambulatory Visit (HOSPITAL_BASED_OUTPATIENT_CLINIC_OR_DEPARTMENT_OTHER): Payer: Medicare HMO

## 2016-01-01 ENCOUNTER — Telehealth: Payer: Self-pay | Admitting: Internal Medicine

## 2016-01-01 ENCOUNTER — Encounter: Payer: Self-pay | Admitting: Internal Medicine

## 2016-01-01 ENCOUNTER — Other Ambulatory Visit: Payer: Self-pay | Admitting: *Deleted

## 2016-01-01 ENCOUNTER — Ambulatory Visit (HOSPITAL_BASED_OUTPATIENT_CLINIC_OR_DEPARTMENT_OTHER): Payer: Medicare HMO | Admitting: Internal Medicine

## 2016-01-01 VITALS — BP 96/64 | HR 108 | Temp 97.8°F | Resp 17 | Ht 66.0 in | Wt 212.4 lb

## 2016-01-01 DIAGNOSIS — C3411 Malignant neoplasm of upper lobe, right bronchus or lung: Secondary | ICD-10-CM | POA: Diagnosis not present

## 2016-01-01 DIAGNOSIS — J90 Pleural effusion, not elsewhere classified: Secondary | ICD-10-CM

## 2016-01-01 DIAGNOSIS — C7951 Secondary malignant neoplasm of bone: Secondary | ICD-10-CM | POA: Diagnosis not present

## 2016-01-01 DIAGNOSIS — C3491 Malignant neoplasm of unspecified part of right bronchus or lung: Secondary | ICD-10-CM

## 2016-01-01 LAB — CBC WITH DIFFERENTIAL/PLATELET
BASO%: 0.4 % (ref 0.0–2.0)
Basophils Absolute: 0 10*3/uL (ref 0.0–0.1)
EOS%: 1.9 % (ref 0.0–7.0)
Eosinophils Absolute: 0.2 10*3/uL (ref 0.0–0.5)
HCT: 44.2 % (ref 38.4–49.9)
HGB: 14.2 g/dL (ref 13.0–17.1)
LYMPH#: 0.8 10*3/uL — AB (ref 0.9–3.3)
LYMPH%: 8.1 % — AB (ref 14.0–49.0)
MCH: 25.8 pg — ABNORMAL LOW (ref 27.2–33.4)
MCHC: 32.1 g/dL (ref 32.0–36.0)
MCV: 80.4 fL (ref 79.3–98.0)
MONO#: 0.9 10*3/uL (ref 0.1–0.9)
MONO%: 8.8 % (ref 0.0–14.0)
NEUT#: 7.8 10*3/uL — ABNORMAL HIGH (ref 1.5–6.5)
NEUT%: 80.8 % — AB (ref 39.0–75.0)
Platelets: 327 10*3/uL (ref 140–400)
RBC: 5.5 10*6/uL (ref 4.20–5.82)
RDW: 17.8 % — ABNORMAL HIGH (ref 11.0–14.6)
WBC: 9.7 10*3/uL (ref 4.0–10.3)

## 2016-01-01 LAB — COMPREHENSIVE METABOLIC PANEL
ALT: 40 U/L (ref 0–55)
AST: 35 U/L — AB (ref 5–34)
Albumin: 1.3 g/dL — ABNORMAL LOW (ref 3.5–5.0)
Alkaline Phosphatase: 67 U/L (ref 40–150)
Anion Gap: 6 mEq/L (ref 3–11)
BUN: 22.5 mg/dL (ref 7.0–26.0)
CHLORIDE: 107 meq/L (ref 98–109)
CO2: 27 meq/L (ref 22–29)
CREATININE: 1.1 mg/dL (ref 0.7–1.3)
Calcium: 8.5 mg/dL (ref 8.4–10.4)
EGFR: 87 mL/min/{1.73_m2} — ABNORMAL LOW (ref >90)
Glucose: 91 mg/dl (ref 70–140)
Potassium: 4.9 mEq/L (ref 3.5–5.1)
SODIUM: 141 meq/L (ref 136–145)
Total Bilirubin: <0.3 mg/dL (ref 0.20–1.20)
Total Protein: 4.7 g/dL — ABNORMAL LOW (ref 6.4–8.3)

## 2016-01-01 MED ORDER — CYANOCOBALAMIN 1000 MCG/ML IJ SOLN
INTRAMUSCULAR | Status: AC
  Filled 2016-01-01: qty 1

## 2016-01-01 MED ORDER — FOLIC ACID 1 MG PO TABS: 1.0000 mg | ORAL_TABLET | Freq: Every day | ORAL | 4 refills | Status: AC

## 2016-01-01 MED ORDER — DEXAMETHASONE 4 MG PO TABS: ORAL_TABLET | ORAL | 1 refills | Status: AC

## 2016-01-01 MED ORDER — LIDOCAINE-PRILOCAINE 2.5-2.5 % EX CREA: 1.0000 "application " | TOPICAL_CREAM | CUTANEOUS | 0 refills | Status: AC | PRN

## 2016-01-01 MED ORDER — PROCHLORPERAZINE MALEATE 10 MG PO TABS: 10.0000 mg | ORAL_TABLET | Freq: Four times a day (QID) | ORAL | 0 refills | Status: AC | PRN

## 2016-01-01 MED ORDER — CYANOCOBALAMIN 1000 MCG/ML IJ SOLN
1000.0000 ug | Freq: Once | INTRAMUSCULAR | Status: AC
  Administered 2016-01-01: 1000 ug via INTRAMUSCULAR

## 2016-01-01 NOTE — Telephone Encounter (Signed)
Per LOS I have scheduled appts and notified the scheduler 
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
## 2016-01-01 NOTE — Telephone Encounter (Signed)
Message sent to chemo scheduler to add chemo. Chemo education class added. Labs and MD appt scheduled. Avs report and  Appointment schedule, given to patient, per 01/01/16 los.

## 2016-01-01 NOTE — Progress Notes (Signed)
START ON PATHWAY REGIMEN - Non-Small Cell Lung  [id number]: Carboplatin AUC=5 + Pemetrexed 500 mg/m2 q21 Days x 4 Cycles   A cycle is every 21 days:     Pemetrexed (Alimta(R)) 500 mg/m2 in 100 mL NS IV over 10 minutes followed 30 minutes later by carboplatin, manufacturer recommends not administering to patients with CrCl < 45 mL/min Dose Mod: None     Carboplatin (Paraplatin(R)) AUC=5 in 250 mL NS IV over 1 hour Dose Mod: None Additional Orders: Note: Patient to receive the following prior to the initiation of therapy: 1) Dexamethasone 4 mg orally twice daily x 6 doses.  First dose 24 hours before chemotherapy. 2) Folic acid >= 048 mcg orally daily.  First dose at least 5 days prior to the first dose of pemetrexed. 3) Vitamin B12 1,000 mcg intramuscularly every 9 weeks.  First dose at least 5 days prior to the first dose of pemetrexed.  All AUC calculations intended to be used in [employer]  **Always confirm dose/schedule in your pharmacy ordering system**    Patient Characteristics: Stage IV Metastatic, Non Squamous, Initial Chemotherapy/Immunotherapy, PS = 0, 1, PD-L1 Expression Positive 1-49% (TPS) / Negative / Not Tested / Not a Candidate for Immunotherapy AJCC M Stage: 1 AJCC N Stage: 3 AJCC T Stage: 2b Current Disease Status: Distant Metastases AJCC Stage Grouping: IV Histology: Non Squamous Cell ROS1 Rearrangement Status: Negative T790M Mutation Status: Not Applicable - EGFR Mutation Negative/Unknown Other Mutations/Biomarkers: No Other Actionable Mutations PD-L1 Expression Status: Quantity Not Sufficient Chemotherapy/Immunotherapy LOT: Initial Chemotherapy/Immunotherapy Molecular Targeted Therapy: Not Appropriate ALK Translocation Status: Negative Would you be surprised if this patient died  in the next year? I would NOT be surprised if this patient died in the next year EGFR Mutation Status: Negative/Wild Type BRAF [id number] Mutation Status: Negative Performance Status: PS  = 0, 1  Intent of Therapy: Non-Curative / Palliative Intent, Discussed with Patient

## 2016-01-02 ENCOUNTER — Encounter: Payer: Self-pay | Admitting: *Deleted

## 2016-01-02 ENCOUNTER — Other Ambulatory Visit: Payer: Medicare HMO

## 2016-01-02 ENCOUNTER — Ambulatory Visit
Admission: RE | Admit: 2016-01-02 | Discharge: 2016-01-02 | Disposition: A | Discharge status: Home / Self Care | Payer: Medicare HMO | Source: Ambulatory Visit | Attending: Radiation Oncology | Admitting: Radiation Oncology

## 2016-01-02 DIAGNOSIS — C7951 Secondary malignant neoplasm of bone: Secondary | ICD-10-CM

## 2016-01-02 DIAGNOSIS — Z510 Encounter for antineoplastic radiation therapy: Secondary | ICD-10-CM | POA: Diagnosis not present

## 2016-01-03 NOTE — Progress Notes (Signed)
  Radiation Oncology         [phone]) [phone] ________________________________  Name: Cameron Chandler MRN: [id number]  Date: 01/02/2016  DOB: September 28, 1955  SIMULATION AND TREATMENT PLANNING NOTE  DIAGNOSIS:     ICD-9-CM ICD-10-CM   1. 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  Identity was confirmed.  All relevant records and images related to the planned course of therapy were reviewed.   Written consent to proceed with treatment was confirmed which was freely given after reviewing the details related to the planned course of therapy had been reviewed with the patient.  Then, the patient was set-up in a stable reproducible  supine position for radiation therapy.  CT images were obtained.  Surface markings were placed.     The CT images were loaded into the planning software.  Then the target and avoidance structures were contoured.  Treatment planning then occurred.  The radiation prescription was entered and confirmed.  A total of 2 complex treatment devices were fabricated which relate to the designed radiation treatment fields. Each of these customized fields/ complex treatment devices will be used on a daily basis during the radiation course. I have requested : Isodose Plan.   PLAN:  The patient will receive 30 Gy in 10 fractions.  ________________________________   Jodelle Gross, MD, PhD

## 2016-01-06 ENCOUNTER — Encounter: Payer: Self-pay | Admitting: *Deleted

## 2016-01-06 DIAGNOSIS — Z510 Encounter for antineoplastic radiation therapy: Secondary | ICD-10-CM | POA: Diagnosis not present

## 2016-01-06 NOTE — Progress Notes (Signed)
Oncology Nurse Navigator Documentation  Oncology Nurse Navigator Flowsheets 01/06/2016  Navigator Location [facility]  Navigator Encounter Type Other/[facility] notified me that molecular studies will not be sent off until 01/08/16 due to billing.  I will update Dr. Julien Nordmann  Treatment Phase Treatment  Barriers/Navigation Needs Coordination of Care  Interventions Coordination of Care  Coordination of Care Other  Acuity Level 2  Acuity Level 2 Other  Time Spent with Patient 30

## 2016-01-08 ENCOUNTER — Ambulatory Visit (HOSPITAL_COMMUNITY)
Admission: RE | Admit: 2016-01-08 | Discharge: 2016-01-08 | Disposition: A | Discharge status: Home / Self Care | Payer: Medicare HMO | Source: Ambulatory Visit | Attending: Thoracic Surgery (Cardiothoracic Vascular Surgery) | Admitting: Thoracic Surgery (Cardiothoracic Vascular Surgery)

## 2016-01-08 ENCOUNTER — Other Ambulatory Visit (HOSPITAL_COMMUNITY)
Admission: RE | Admit: 2016-01-08 | Discharge: 2016-01-08 | Disposition: A | Discharge status: Home / Self Care | Payer: Medicare HMO | Source: Ambulatory Visit | Attending: Internal Medicine | Admitting: Internal Medicine

## 2016-01-08 ENCOUNTER — Encounter (HOSPITAL_COMMUNITY): Payer: Self-pay | Admitting: *Deleted

## 2016-01-08 DIAGNOSIS — R918 Other nonspecific abnormal finding of lung field: Secondary | ICD-10-CM | POA: Insufficient documentation

## 2016-01-08 DIAGNOSIS — J90 Pleural effusion, not elsewhere classified: Secondary | ICD-10-CM | POA: Insufficient documentation

## 2016-01-08 DIAGNOSIS — I700 Atherosclerosis of aorta: Secondary | ICD-10-CM | POA: Insufficient documentation

## 2016-01-08 NOTE — Progress Notes (Addendum)
Saw a cardiologist in [place] a yr ago,follows up yearly-DR.Koneswaran  Medical Md is Dr.Hasanji  Echo report in epic from 12/22/15  Stress test report in epic from 11/13/15  Heart cath in 2007  EKG in epic from 12-11-15

## 2016-01-09 ENCOUNTER — Ambulatory Visit: Payer: Medicare HMO | Admitting: Radiation Oncology

## 2016-01-09 ENCOUNTER — Ambulatory Visit (HOSPITAL_COMMUNITY): Payer: Medicare HMO | Admitting: Anesthesiology

## 2016-01-09 ENCOUNTER — Ambulatory Visit (HOSPITAL_COMMUNITY): Payer: Medicare HMO

## 2016-01-09 ENCOUNTER — Ambulatory Visit (HOSPITAL_COMMUNITY)
Admission: RE | Admit: 2016-01-09 | Discharge: 2016-01-09 | Disposition: A | Discharge status: Home / Self Care | Payer: Medicare HMO | Source: Ambulatory Visit | Attending: Thoracic Surgery (Cardiothoracic Vascular Surgery) | Admitting: Thoracic Surgery (Cardiothoracic Vascular Surgery)

## 2016-01-09 ENCOUNTER — Encounter (HOSPITAL_COMMUNITY): Payer: Self-pay | Admitting: Urology

## 2016-01-09 ENCOUNTER — Encounter (HOSPITAL_COMMUNITY)
Admission: RE | Disposition: A | Discharge status: Home / Self Care | Payer: Self-pay | Source: Ambulatory Visit | Attending: Thoracic Surgery (Cardiothoracic Vascular Surgery)

## 2016-01-09 DIAGNOSIS — E039 Hypothyroidism, unspecified: Secondary | ICD-10-CM | POA: Insufficient documentation

## 2016-01-09 DIAGNOSIS — I252 Old myocardial infarction: Secondary | ICD-10-CM | POA: Diagnosis not present

## 2016-01-09 DIAGNOSIS — Z9221 Personal history of antineoplastic chemotherapy: Secondary | ICD-10-CM | POA: Insufficient documentation

## 2016-01-09 DIAGNOSIS — Z794 Long term (current) use of insulin: Secondary | ICD-10-CM | POA: Diagnosis not present

## 2016-01-09 DIAGNOSIS — I509 Heart failure, unspecified: Secondary | ICD-10-CM | POA: Insufficient documentation

## 2016-01-09 DIAGNOSIS — Z7901 Long term (current) use of anticoagulants: Secondary | ICD-10-CM | POA: Insufficient documentation

## 2016-01-09 DIAGNOSIS — J910 Malignant pleural effusion: Secondary | ICD-10-CM | POA: Insufficient documentation

## 2016-01-09 DIAGNOSIS — Z85038 Personal history of other malignant neoplasm of large intestine: Secondary | ICD-10-CM | POA: Insufficient documentation

## 2016-01-09 DIAGNOSIS — M1990 Unspecified osteoarthritis, unspecified site: Secondary | ICD-10-CM | POA: Diagnosis not present

## 2016-01-09 DIAGNOSIS — Z6834 Body mass index (BMI) 34.0-34.9, adult: Secondary | ICD-10-CM | POA: Diagnosis not present

## 2016-01-09 DIAGNOSIS — I2510 Atherosclerotic heart disease of native coronary artery without angina pectoris: Secondary | ICD-10-CM | POA: Diagnosis not present

## 2016-01-09 DIAGNOSIS — Z419 Encounter for procedure for purposes other than remedying health state, unspecified: Secondary | ICD-10-CM

## 2016-01-09 DIAGNOSIS — C7951 Secondary malignant neoplasm of bone: Secondary | ICD-10-CM | POA: Diagnosis not present

## 2016-01-09 DIAGNOSIS — Z7984 Long term (current) use of oral hypoglycemic drugs: Secondary | ICD-10-CM | POA: Insufficient documentation

## 2016-01-09 DIAGNOSIS — F17210 Nicotine dependence, cigarettes, uncomplicated: Secondary | ICD-10-CM | POA: Insufficient documentation

## 2016-01-09 DIAGNOSIS — C3491 Malignant neoplasm of unspecified part of right bronchus or lung: Secondary | ICD-10-CM | POA: Insufficient documentation

## 2016-01-09 DIAGNOSIS — I110 Hypertensive heart disease with heart failure: Secondary | ICD-10-CM | POA: Insufficient documentation

## 2016-01-09 DIAGNOSIS — Z8546 Personal history of malignant neoplasm of prostate: Secondary | ICD-10-CM | POA: Diagnosis not present

## 2016-01-09 DIAGNOSIS — Z86718 Personal history of other venous thrombosis and embolism: Secondary | ICD-10-CM | POA: Diagnosis not present

## 2016-01-09 DIAGNOSIS — E669 Obesity, unspecified: Secondary | ICD-10-CM | POA: Insufficient documentation

## 2016-01-09 DIAGNOSIS — E1151 Type 2 diabetes mellitus with diabetic peripheral angiopathy without gangrene: Secondary | ICD-10-CM | POA: Diagnosis not present

## 2016-01-09 DIAGNOSIS — Z9049 Acquired absence of other specified parts of digestive tract: Secondary | ICD-10-CM | POA: Diagnosis not present

## 2016-01-09 DIAGNOSIS — E785 Hyperlipidemia, unspecified: Secondary | ICD-10-CM | POA: Insufficient documentation

## 2016-01-09 DIAGNOSIS — J90 Pleural effusion, not elsewhere classified: Secondary | ICD-10-CM

## 2016-01-09 DIAGNOSIS — Z7982 Long term (current) use of aspirin: Secondary | ICD-10-CM | POA: Insufficient documentation

## 2016-01-09 HISTORY — DX: Hypothyroidism, unspecified: E03.9

## 2016-01-09 HISTORY — PX: CHEST TUBE INSERTION: SHX231

## 2016-01-09 HISTORY — DX: Nocturia: R35.1

## 2016-01-09 HISTORY — DX: Paresthesia of skin: R20.2

## 2016-01-09 HISTORY — DX: Weakness: R53.1

## 2016-01-09 HISTORY — DX: Unspecified convulsions: R56.9

## 2016-01-09 HISTORY — PX: PORTACATH PLACEMENT: SHX2246

## 2016-01-09 HISTORY — DX: Dyspnea, unspecified: R06.00

## 2016-01-09 LAB — COMPREHENSIVE METABOLIC PANEL
ALBUMIN: 1.9 g/dL — AB (ref 3.5–5.0)
ALK PHOS: 46 U/L (ref 38–126)
ALT: 16 U/L — ABNORMAL LOW (ref 17–63)
ANION GAP: 8 (ref 5–15)
AST: 26 U/L (ref 15–41)
BILIRUBIN TOTAL: 0.4 mg/dL (ref 0.3–1.2)
BUN: 23 mg/dL — ABNORMAL HIGH (ref 6–20)
CALCIUM: 8.3 mg/dL — AB (ref 8.9–10.3)
CO2: 26 mmol/L (ref 22–32)
Chloride: 104 mmol/L (ref 101–111)
Creatinine, Ser: 1.12 mg/dL (ref 0.61–1.24)
Glucose, Bld: 92 mg/dL (ref 65–99)
POTASSIUM: 5.4 mmol/L — AB (ref 3.5–5.1)
Sodium: 138 mmol/L (ref 135–145)
TOTAL PROTEIN: 5.1 g/dL — AB (ref 6.5–8.1)

## 2016-01-09 LAB — CBC
HEMATOCRIT: 45.9 % (ref 39.0–52.0)
HEMOGLOBIN: 15.3 g/dL (ref 13.0–17.0)
MCH: 26.3 pg (ref 26.0–34.0)
MCHC: 33.3 g/dL (ref 30.0–36.0)
MCV: 79 fL (ref 78.0–100.0)
Platelets: 370 10*3/uL (ref 150–400)
RBC: 5.81 MIL/uL (ref 4.22–5.81)
RDW: 18.2 % — ABNORMAL HIGH (ref 11.5–15.5)
WBC: 9.5 10*3/uL (ref 4.0–10.5)

## 2016-01-09 LAB — PROTIME-INR
INR: 0.98
Prothrombin Time: 13 seconds (ref 11.4–15.2)

## 2016-01-09 LAB — APTT: aPTT: 30 seconds (ref 24–36)

## 2016-01-09 LAB — GLUCOSE, CAPILLARY
GLUCOSE-CAPILLARY: 81 mg/dL (ref 65–99)
GLUCOSE-CAPILLARY: 103 mg/dL — AB (ref 65–99)

## 2016-01-09 SURGERY — INSERTION, PLEURAL DRAINAGE CATHETER
Anesthesia: General | Site: Chest | Laterality: Right

## 2016-01-09 MED ORDER — PHENYLEPHRINE HCL 10 MG/ML IJ SOLN
INTRAMUSCULAR | Status: DC | PRN
  Administered 2016-01-09: 80 ug via INTRAVENOUS
  Administered 2016-01-09: 120 ug via INTRAVENOUS

## 2016-01-09 MED ORDER — HYDROCODONE-ACETAMINOPHEN 7.5-325 MG PO TABS
1.0000 | ORAL_TABLET | Freq: Once | ORAL | Status: AC | PRN
  Administered 2016-01-09: 1 via ORAL

## 2016-01-09 MED ORDER — FENTANYL CITRATE (PF) 100 MCG/2ML IJ SOLN: 25.0000 ug | INTRAMUSCULAR | Status: DC | PRN

## 2016-01-09 MED ORDER — PHENYLEPHRINE HCL 10 MG/ML IJ SOLN
INTRAVENOUS | Status: DC | PRN
  Administered 2016-01-09: 25 ug/min via INTRAVENOUS

## 2016-01-09 MED ORDER — DEXTROSE 5 % IV SOLN
INTRAVENOUS | Status: AC
  Filled 2016-01-09: qty 1.5

## 2016-01-09 MED ORDER — LIDOCAINE 2% (20 MG/ML) 5 ML SYRINGE
INTRAMUSCULAR | Status: AC
  Filled 2016-01-09: qty 5

## 2016-01-09 MED ORDER — FENTANYL CITRATE (PF) 100 MCG/2ML IJ SOLN
INTRAMUSCULAR | Status: DC | PRN
  Administered 2016-01-09 (×2): 50 ug via INTRAVENOUS

## 2016-01-09 MED ORDER — LACTATED RINGERS IV SOLN
INTRAVENOUS | Status: DC
Start: 2016-01-09 — End: 2016-01-09
  Administered 2016-01-09 (×2): via INTRAVENOUS

## 2016-01-09 MED ORDER — MEPERIDINE HCL 25 MG/ML IJ SOLN: 6.2500 mg | INTRAMUSCULAR | Status: DC | PRN

## 2016-01-09 MED ORDER — OXYCODONE-ACETAMINOPHEN 5-325 MG PO TABS: 1.0000 | ORAL_TABLET | Freq: Four times a day (QID) | ORAL | 0 refills | Status: DC | PRN

## 2016-01-09 MED ORDER — HEPARIN SODIUM (PORCINE) 1000 UNIT/ML IJ SOLN
INTRAMUSCULAR | Status: DC | PRN
  Administered 2016-01-09: 2.5 mL

## 2016-01-09 MED ORDER — DEXTROSE 5 % IV SOLN
1.5000 g | INTRAVENOUS | Status: AC
  Administered 2016-01-09: 1.5 g via INTRAVENOUS

## 2016-01-09 MED ORDER — HEPARIN SODIUM (PORCINE) 1000 UNIT/ML IJ SOLN
INTRAMUSCULAR | Status: AC
  Filled 2016-01-09: qty 1

## 2016-01-09 MED ORDER — 0.9 % SODIUM CHLORIDE (POUR BTL) OPTIME
TOPICAL | Status: DC | PRN
  Administered 2016-01-09: 1000 mL

## 2016-01-09 MED ORDER — ALBUMIN HUMAN 5 % IV SOLN
INTRAVENOUS | Status: DC | PRN
  Administered 2016-01-09: 11:00:00 via INTRAVENOUS

## 2016-01-09 MED ORDER — METOPROLOL TARTRATE 12.5 MG HALF TABLET
12.5000 mg | ORAL_TABLET | Freq: Once | ORAL | Status: AC
  Administered 2016-01-09: 12.5 mg via ORAL
  Filled 2016-01-09: qty 1

## 2016-01-09 MED ORDER — ONDANSETRON HCL 4 MG/2ML IJ SOLN: 4.0000 mg | Freq: Once | INTRAMUSCULAR | Status: DC | PRN

## 2016-01-09 MED ORDER — PROPOFOL 10 MG/ML IV BOLUS
INTRAVENOUS | Status: AC
  Filled 2016-01-09: qty 20

## 2016-01-09 MED ORDER — LIDOCAINE HCL (PF) 1 % IJ SOLN
INTRAMUSCULAR | Status: AC
  Filled 2016-01-09: qty 30

## 2016-01-09 MED ORDER — LIDOCAINE HCL (CARDIAC) 20 MG/ML IV SOLN
INTRAVENOUS | Status: DC | PRN
  Administered 2016-01-09: 70 mg via INTRAVENOUS

## 2016-01-09 MED ORDER — HYDROCODONE-ACETAMINOPHEN 7.5-325 MG PO TABS
ORAL_TABLET | ORAL | Status: AC
  Filled 2016-01-09: qty 1

## 2016-01-09 MED ORDER — ONDANSETRON HCL 4 MG/2ML IJ SOLN
INTRAMUSCULAR | Status: AC
  Filled 2016-01-09: qty 2

## 2016-01-09 MED ORDER — LACTATED RINGERS IV SOLN: INTRAVENOUS | Status: DC

## 2016-01-09 MED ORDER — SODIUM CHLORIDE 0.9 % IV SOLN
INTRAVENOUS | Status: DC | PRN
  Administered 2016-01-09: 500 mL

## 2016-01-09 MED ORDER — PROPOFOL 10 MG/ML IV BOLUS
INTRAVENOUS | Status: DC | PRN
  Administered 2016-01-09: 150 mg via INTRAVENOUS

## 2016-01-09 MED ORDER — EPHEDRINE SULFATE 50 MG/ML IJ SOLN
INTRAMUSCULAR | Status: DC | PRN
  Administered 2016-01-09: 15 mg via INTRAVENOUS

## 2016-01-09 MED ORDER — PHENYLEPHRINE 40 MCG/ML (10ML) SYRINGE FOR IV PUSH (FOR BLOOD PRESSURE SUPPORT)
PREFILLED_SYRINGE | INTRAVENOUS | Status: AC
  Filled 2016-01-09: qty 10

## 2016-01-09 MED ORDER — ONDANSETRON HCL 4 MG/2ML IJ SOLN
INTRAMUSCULAR | Status: DC | PRN
  Administered 2016-01-09: 4 mg via INTRAVENOUS

## 2016-01-09 MED ORDER — FENTANYL CITRATE (PF) 100 MCG/2ML IJ SOLN
INTRAMUSCULAR | Status: AC
  Filled 2016-01-09: qty 2

## 2016-01-09 MED ORDER — LIDOCAINE HCL 1 % IJ SOLN
INTRAMUSCULAR | Status: DC | PRN
  Administered 2016-01-09: 20 mL

## 2016-01-09 MED ORDER — IOPAMIDOL (ISOVUE-300) INJECTION 61%
INTRAVENOUS | Status: AC
  Filled 2016-01-09: qty 50

## 2016-01-09 SURGICAL SUPPLY — 47 items
BAG DECANTER FOR FLEXI CONT (MISCELLANEOUS) ×4 IMPLANT
CANISTER SUCTION 2500CC (MISCELLANEOUS) ×4 IMPLANT
COVER SURGICAL LIGHT HANDLE (MISCELLANEOUS) ×4 IMPLANT
DERMABOND ADHESIVE PROPEN (GAUZE/BANDAGES/DRESSINGS) ×4
DERMABOND ADVANCED (GAUZE/BANDAGES/DRESSINGS) ×2
DERMABOND ADVANCED .7 DNX12 (GAUZE/BANDAGES/DRESSINGS) ×2 IMPLANT
DERMABOND ADVANCED .7 DNX6 (GAUZE/BANDAGES/DRESSINGS) ×4 IMPLANT
DRAPE C-ARM 42X72 X-RAY (DRAPES) ×4 IMPLANT
DRAPE CHEST BREAST 15X10 FENES (DRAPES) ×4 IMPLANT
DRAPE LAPAROSCOPIC ABDOMINAL (DRAPES) ×4 IMPLANT
ELECT REM PT RETURN 9FT ADLT (ELECTROSURGICAL) ×4
ELECTRODE REM PT RTRN 9FT ADLT (ELECTROSURGICAL) ×2 IMPLANT
GLOVE BIO SURGEON STRL SZ 6.5 (GLOVE) ×3 IMPLANT
GLOVE BIO SURGEONS STRL SZ 6.5 (GLOVE) ×1
GLOVE SURG SIGNA 7.5 PF LTX (GLOVE) ×4 IMPLANT
GOWN STRL REUS W/ TWL LRG LVL3 (GOWN DISPOSABLE) ×2 IMPLANT
GOWN STRL REUS W/ TWL XL LVL3 (GOWN DISPOSABLE) ×2 IMPLANT
GOWN STRL REUS W/TWL LRG LVL3 (GOWN DISPOSABLE) ×2
GOWN STRL REUS W/TWL XL LVL3 (GOWN DISPOSABLE) ×2
GUIDEWIRE UNCOATED ST S 7038 (WIRE) IMPLANT
INTRODUCER 13FR (MISCELLANEOUS) IMPLANT
INTRODUCER COOK 11FR (CATHETERS) IMPLANT
KIT BASIN OR (CUSTOM PROCEDURE TRAY) ×4 IMPLANT
KIT PLEURX DRAIN CATH 1000ML (MISCELLANEOUS) ×4 IMPLANT
KIT PLEURX DRAIN CATH 15.5FR (DRAIN) ×4 IMPLANT
KIT PORT POWER 9.6FR MRI PREA (Catheter) ×4 IMPLANT
KIT ROOM TURNOVER OR (KITS) ×4 IMPLANT
NEEDLE HYPO 25GX1X1/2 BEV (NEEDLE) ×4 IMPLANT
NS IRRIG 1000ML POUR BTL (IV SOLUTION) ×4 IMPLANT
PACK GENERAL/GYN (CUSTOM PROCEDURE TRAY) ×4 IMPLANT
PAD ARMBOARD 7.5X6 YLW CONV (MISCELLANEOUS) ×8 IMPLANT
SET DRAINAGE LINE (MISCELLANEOUS) IMPLANT
SET SHEATH INTRODUCER 10FR (MISCELLANEOUS) IMPLANT
SUT ETHILON 3 0 FSL (SUTURE) ×4 IMPLANT
SUT MNCRL AB 4-0 PS2 18 (SUTURE) IMPLANT
SUT VIC AB 3-0 SH 27 (SUTURE) ×6
SUT VIC AB 3-0 SH 27X BRD (SUTURE) ×6 IMPLANT
SUT VIC AB 3-0 X1 27 (SUTURE) ×4 IMPLANT
SUT VICRYL 4-0 PS2 18IN ABS (SUTURE) ×4 IMPLANT
SYR 20CC LL (SYRINGE) ×4 IMPLANT
SYR 5ML LL (SYRINGE) ×4 IMPLANT
SYR CONTROL 10ML LL (SYRINGE) ×4 IMPLANT
TOWEL OR 17X24 6PK STRL BLUE (TOWEL DISPOSABLE) ×4 IMPLANT
TOWEL OR 17X26 10 PK STRL BLUE (TOWEL DISPOSABLE) ×4 IMPLANT
VALVE REPLACEMENT CAP (MISCELLANEOUS) IMPLANT
WATER STERILE IRR 1000ML POUR (IV SOLUTION) ×4 IMPLANT
WIRE .035 3MM-J 145CM (WIRE) IMPLANT

## 2016-01-09 NOTE — Care Management Note (Signed)
Case Management Note  Patient Details  Name: [name] MRN: [id number] Date of Birth: 03/09/1956  Subjective/Objective:                  60 year old man recently diagnosed with stage IV lung cancer.  Presented to OR for procedure(s): INSERTION PLEURAL DRAINAGE CATHETER (Right) INSERTION PORT-A-CATH (N/A) as a surgical intervention .   Action/Plan: Follow for disposition needs. Set up home health services.   Expected Discharge Date:  01/09/16               Expected Discharge Plan:  [place]  In-House Referral:  NA  Discharge planning Services  CM Consult  Post Acute Care Choice:  Durable Medical Equipment, Home Health Choice offered to:  Patient  DME Arranged:  Chest tube pluerex DME Agency:  [place]:  RN [facility] Agency:  [facility]  Status of Service:  Completed, signed off  If discussed at [place] of Stay Meetings, dates discussed:    Additional Comments: Seddrick Flax J. Clydene Laming, RN, BSN, [employer] [phone] Spoke with pt at bedside regarding discharge planning for [facility]. Offered pt list of home health agencies to choose from.  Pt chose [facility] to render services. Danny Lawless of [facility] notified.  DME needs identified at this time include catheter drainage kits of which, the pt has been supplied a case to take home.  Fuller Mandril, RN 01/09/2016, 1:48 PM

## 2016-01-09 NOTE — Anesthesia Postprocedure Evaluation (Signed)
Anesthesia Post Note  Patient: Cameron Chandler  Procedure(s) Performed: Procedure(s) (LRB): INSERTION RIGHT PLEURAL DRAINAGE CATHETER (Right) INSERTION PORT-A-CATH (Left)  Patient location during evaluation: PACU Anesthesia Type: General Level of consciousness: awake and alert Pain management: pain level controlled Vital Signs Assessment: post-procedure vital signs reviewed and stable Respiratory status: spontaneous breathing, nonlabored ventilation and respiratory function stable Cardiovascular status: blood pressure returned to baseline and stable Postop Assessment: no signs of nausea or vomiting Anesthetic complications: no    Last Vitals:  Vitals:   01/09/16 1205 01/09/16 1220  BP: 108/72 111/85  Pulse: 87   Resp: (!) 23 19  Temp: 36.1 C     Last Pain:  Vitals:   01/09/16 1205  PainSc: 6                  Terrianne Cavness,[name]

## 2016-01-09 NOTE — Interval H&P Note (Signed)
History and Physical Interval Note:  01/09/2016 10:25 AM  Cameron Chandler  has presented today for surgery, with the diagnosis of RIGHT PLEURAL EFFUSION  The various methods of treatment have been discussed with the patient and family. After consideration of risks, benefits and other options for treatment, the patient has consented to  Procedure(s): INSERTION PLEURAL DRAINAGE CATHETER (Right) INSERTION PORT-A-CATH (N/A) as a surgical intervention .  The patient's history has been reviewed, patient examined, no change in status, stable for surgery.  I have reviewed the patient's chart and labs.  Questions were answered to the patient's satisfaction.     Melrose Nakayama

## 2016-01-09 NOTE — Anesthesia Procedure Notes (Signed)
Procedure Name: LMA Insertion Date/Time: 01/09/2016 10:47 AM Performed by: Greggory Stallion, Finlay L Pre-anesthesia Checklist: Patient identified, Emergency Drugs available, Suction available and Patient being monitored Patient Re-evaluated:Patient Re-evaluated prior to inductionOxygen Delivery Method: Circle System Utilized Preoxygenation: Pre-oxygenation with 100% oxygen Intubation Type: IV induction Ventilation: Mask ventilation without difficulty LMA: LMA inserted LMA Size: 5.0 Number of attempts: 2 Placement Confirmation: positive ETCO2 Tube secured with: Tape Dental Injury: Teeth and Oropharynx as per pre-operative assessment  Comments: Pt denied loose dentition but obvious lower decay and gum recession mobile teeth noted prior to lma insertion

## 2016-01-09 NOTE — Progress Notes (Signed)
Pt states he has left upper extremity blood clot. Pt also states his legs are swollen and weeping. Pad placed under legs.

## 2016-01-09 NOTE — Brief Op Note (Addendum)
01/09/2016  12:56 PM  PATIENT:  Cameron Chandler  60 y.o. male  PRE-OPERATIVE DIAGNOSIS:  MALIGNANT RIGHT PLEURAL EFFUSION  POST-OPERATIVE DIAGNOSIS:  MALIGNANT RIGHT PLEURAL EFFUSION  PROCEDURE:  Procedure(s): INSERTION RIGHT PLEURAL DRAINAGE CATHETER (Right) INSERTION PORT-A-CATH (Left)  SURGEON:  Surgeon(s) and Role:    * Melrose Nakayama, MD - Primary  ANESTHESIA:   general  EBL:  Total I/O In: 900 [I.V.:650; IV Piggyback:250] Out: 1010 [Other:1000; Blood:10]  BLOOD ADMINISTERED:none  DRAINS: pleural catheter - right   LOCAL MEDICATIONS USED:  LIDOCAINE  and Amount: 10 ml  SPECIMEN:  Source of Specimen:  pleural fluid- chemistry  DISPOSITION OF SPECIMEN:  LAB  COUNTS:  YES   PLAN OF CARE: Discharge to home after PACU  PATIENT DISPOSITION:  PACU - hemodynamically stable.    Dictation # [id number] Delay start of Pharmacological VTE agent (>24hrs) due to surgical blood loss or risk of bleeding: not applicable  1 liter of cloudy fluid from right chest

## 2016-01-09 NOTE — Discharge Instructions (Signed)
Do not drive or engage in heavy physical activity for 24 hours  You may resume normal activities and shower tomorrow  A home health nurse will be arranged for drainage of the pleural catheter  You may use the Percocet you already have for pain control as needed  Do not drive within 6 hours of taking percocet  Call [phone] if you develop chest pain, worsening shortness of breath or fever > 101 F  Call if you notice excessive pain, swelling or redness around the incisions or drainage from the incision  You may resume coumadin tonight with your regular dose  My office will contact you with follow up information

## 2016-01-09 NOTE — H&P (View-Only) (Signed)
[address]       [place] [address]             [phone]       HPI: Cameron Chandler returns today for follow-up regarding his malignant right pleural effusion.  He is a 60 year old man recently diagnosed with stage IV lung cancer. He first presented with shortness of breath and leg swelling. Workup showed a right upper lobe mass. He had a thoracentesis of right pleural effusion draining a liter of fluid which provided symptomatically relief. 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 He's been drained about every week to 10 days since then.  He was scheduled for pleural catheter placement on 12/20/2015. However he was started on Coumadin for an internal jugular thrombus. We rescheduled the surgery for the 27th to give him time to be off the Coumadin, but unfortunately he became symptomatic and was admitted to [facility] on the 23rd. He had a thoracentesis done which again resulted in symptomatic relief.  He now presents to discuss placement pleural catheter. His breathing improved after thoracentesis and his primary complaint today is swelling in his lower extremities.  Past Medical History:  Diagnosis Date  . Adenocarcinoma of right lung, stage 4 ([place]) 12/03/2015  . Arthritis    back, joint pain  . Bone cancer ([place])    T 1T2  . Cancer [facility]) 2011   colon cancer  . Colon cancer [facility])    Colon resection and Chemotherapy  . Coronary atherosclerosis of native coronary artery   . Diabetes mellitus without complication (HCC)    Type II  . DVT (deep venous thrombosis) (H[facility])    Left arm  . Heart attack   . Myocardial infarction   . Other and unspecified hyperlipidemia   . Prostate cancer ([place]) 12/13/12   ,Radiation  . Unspecified essential hypertension       Current Outpatient Prescriptions  Medication Sig Dispense Refill  . aspirin EC 81 MG tablet Take 81 mg by mouth daily.    . Fenofibric Acid 105 MG TABS Take 105 mg by mouth daily.     Marland Kitchen  gabapentin (NEURONTIN) 100 MG capsule Take 1 capsule (100 mg total) by mouth 3 (three) times daily. 90 capsule 0  . hydrochlorothiazide (HYDRODIURIL) 25 MG tablet Take 25 mg by mouth daily.    . insulin detemir (LEVEMIR) 100 unit/ml SOLN Inject 30 Units into the skin at bedtime.    Marland Kitchen levothyroxine (SYNTHROID, LEVOTHROID) 25 MCG tablet Take 1 tablet (25 mcg total) by mouth daily before breakfast. 30 tablet 11  . meloxicam (MOBIC) 7.5 MG tablet Take 7.5 mg by mouth daily.    . metFORMIN (GLUCOPHAGE) 500 MG tablet Take 500 mg by mouth 2 (two) times daily with a meal.    . metoprolol tartrate (LOPRESSOR) 25 MG tablet Take 12.5 mg by mouth 2 (two) times daily.    . nitroGLYCERIN (NITROSTAT) 0.4 MG SL tablet Place 0.4 mg under the tongue every 5 (five) minutes as needed for chest pain.    Marland Kitchen oxyCODONE-acetaminophen (PERCOCET/ROXICET) 5-325 MG tablet Take 1-2 tablets by mouth every 6 (six) hours as needed for severe pain. 60 tablet 0  . simvastatin (ZOCOR) 80 MG tablet Take 80 mg by mouth at bedtime.    . valsartan (DIOVAN) 160 MG tablet Take 1 tablet (160 mg total) by mouth daily. 30 tablet 11  . warfarin (COUMADIN) 5 MG tablet Take 5 mg by mouth daily. Or as directed  1  . furosemide (LASIX) 40 MG tablet Take 1 tablet (40 mg total) by mouth daily. 14 tablet 1  . potassium chloride (K-DUR,KLOR-CON) 10 MEQ tablet Take 1 tablet (10 mEq total) by mouth daily. 14 tablet 1   No current facility-administered medications for this visit.     Physical Exam BP 94/65   Pulse 86   Resp 18   Ht '5\' 6"'$  (1.676 m)   Wt 206 lb (93.4 kg)   SpO2 95% Comment: ON RA  BMI 33.9 kg/m  60 year old man in no acute distress Ill-appearing Alert and oriented 3 with no focal deficits Lungs with diminished breath sounds both bases left greater than right Cardiac regular rate and rhythm normal S1 and S2 Abdomen positive ascites Extremities 4+ lower extremity edema bilaterally  Diagnostic Tests: CHEST  2  VIEW  COMPARISON:  Chest CT 12/22/2015  FINDINGS: Cardiomediastinal silhouette is normal. Mediastinal contours appear intact.  There is which shaped right upper lobe opacity which corresponds to patient's known lung malignancy. There is an increasing left lower lobe airspace opacification, which may represent atelectasis versus acute infectious consolidation. There are bilateral pleural effusions.  Osseous structures are without acute abnormality. Soft tissues are grossly normal.  IMPRESSION: Increased left lower lobe airspace opacification, which may represent atelectasis versus infectious consolidation.  Persistent bilateral pleural effusions, left greater than right.  Known right upper lobe mass.   Electronically Signed   By: Fidela Salisbury M.D.   On: 12/31/2015 16:23 I personally reviewed his chest x-ray today. It shows minimal if any reaccumulation on the right. There is a moderate left effusion  Impression: 60 year old man with stage IV lung cancer who has a malignant right pleural effusion possibly malignant left pleural effusion as well. After thoracentesis of the right the left side actually larger at this point but is been relatively stable more so than the right side. He has been repeatedly getting symptomatic enough that a week to 2 weeks to need a repeat thoracentesis. The timing is tricky in his case because of Coumadin as he needs to be off that for several days prior to having a catheter placement. The only documentation I can find of venous thrombosis is the internal jugular vein which is nonocclusive and the cephalic vein on the left arm. There is no evidence of PE on his scan, therefore think Coumadin can safely be held prior to pleural catheter placement.  He understands the indications, risks, benefits, and alternatives. That is been discussed with him in detail previously.  We are going to plan to place a right pleural catheter on Thursday,  October 12. He will stop his Coumadin after his dose on Saturday the seventh. He can resume it again immediately after the procedure. He may ultimately need a left-sided catheter as well, we will have to keep an eye on that.  His primary concern currently is his 4+ edema  in both lower extremities there really is quite dramatic. I will start him on Lasix and potassium and recommended a follow-up with Dr. Sherrie Sport regarding that issue.   Plan: Lasix 40 mg daily, potassium 20 mEq daily Right pleural catheter placement on Thursday, October 12 We will have him get a PA and lateral chest x-ray Cameron Chandler on the 11th to make sure there is adequate fluid to place the pleural catheter.  Cameron Nakayama, MD [facility] [phone]

## 2016-01-09 NOTE — Discharge Planning (Signed)
Pt physical address:  [address] [place], [place] [address]

## 2016-01-09 NOTE — Anesthesia Preprocedure Evaluation (Signed)
Anesthesia Evaluation  Patient identified by MRN, date of birth, ID band Patient awake    Reviewed: Allergy & Precautions, NPO status , Patient's Chart, lab work & pertinent test results, reviewed documented beta blocker date and time   History of Anesthesia Complications Negative for: history of anesthetic complications  Airway Mallampati: II  TM Distance: >3 FB Neck ROM: Full    Dental  (+) Edentulous Upper   Pulmonary shortness of breath, with exertion and at rest, neg sleep apnea, pneumonia, neg COPD, neg recent URI, Current Smoker,  Hx/o recent acute respiratory failure Adenocarcinoma right lung Stage 4  Decreased breath sounds right basilar lung fields   + decreased breath sounds      Cardiovascular hypertension, Pt. on medications and Pt. on home beta blockers + CAD, + Past MI, + Cardiac Stents, + Peripheral Vascular Disease and +CHF  Normal cardiovascular exam(-) dysrhythmias  Rhythm:Regular Rate:Normal  Hx/o DVT upper ext   Neuro/Psych Seizures -, Well Controlled,  negative neurological ROS  negative psych ROS   GI/Hepatic negative GI ROS, Neg liver ROS, Hx/o colon Ca S/P resection   Endo/Other  diabetes, Well Controlled, Type 2, Oral Hypoglycemic AgentsHypothyroidism Hyperlipidemia  Renal/GU negative Renal ROS   Prostate Ca negative genitourinary   Musculoskeletal  (+) Arthritis , Hx/o bone metastasis   Abdominal (+) + obese,   Peds  Hematology   Anesthesia Other Findings   Reproductive/Obstetrics                             Lab Results  Component Value Date   WBC 9.7 01/01/2016   HGB 14.2 01/01/2016   HCT 44.2 01/01/2016   MCV 80.4 01/01/2016   PLT 327 01/01/2016     Chemistry      Component Value Date/Time   NA 141 01/01/2016 0841   K 4.9 01/01/2016 0841   CL 106 12/23/2015 0510   CO2 27 01/01/2016 0841   BUN 22.5 01/01/2016 0841   CREATININE 1.1 01/01/2016 0841       Component Value Date/Time   CALCIUM 8.5 01/01/2016 0841   ALKPHOS 67 01/01/2016 0841   AST 35 (H) 01/01/2016 0841   ALT 40 01/01/2016 0841   BILITOT <0.30 01/01/2016 0841     EKG 12/21/2015: sinus tachycardia.  Anesthesia Physical  Anesthesia Plan  ASA: III  Anesthesia Plan: General   Post-op Pain Management:    Induction: Intravenous  Airway Management Planned: Oral ETT  Additional Equipment: None  Intra-op Plan:   Post-operative Plan: Extubation in OR  Informed Consent: I have reviewed the patients History and Physical, chart, labs and discussed the procedure including the risks, benefits and alternatives for the proposed anesthesia with the patient or authorized representative who has indicated his/her understanding and acceptance.   Dental advisory given  Plan Discussed with: CRNA and Surgeon  Anesthesia Plan Comments:         Anesthesia Quick Evaluation

## 2016-01-09 NOTE — Transfer of Care (Signed)
Immediate Anesthesia Transfer of Care Note  Patient: Cameron Chandler  Procedure(s) Performed: Procedure(s): INSERTION RIGHT PLEURAL DRAINAGE CATHETER (Right) INSERTION PORT-A-CATH (Left)  Patient Location: PACU  Anesthesia Type:General  Level of Consciousness: sedated, patient cooperative and responds to stimulation  Airway & Oxygen Therapy: Patient Spontanous Breathing and Patient connected to face mask oxygen  Post-op Assessment: Report given to RN, Post -op Vital signs reviewed and stable and Patient moving all extremities  Post vital signs: Reviewed and stable  Last Vitals:  Vitals:   01/09/16 0738 01/09/16 0817  BP: 102/82 100/75  Pulse: (!) 108 (!) 102  Resp: 18     Last Pain:  Vitals:   01/09/16 0756  PainSc: 9       Patients Stated Pain Goal: 3 (27/61/84 8592)  Complications: No apparent anesthesia complications

## 2016-01-10 ENCOUNTER — Ambulatory Visit: Payer: Medicare HMO

## 2016-01-10 ENCOUNTER — Encounter (HOSPITAL_COMMUNITY): Payer: Self-pay | Admitting: Thoracic Surgery (Cardiothoracic Vascular Surgery)

## 2016-01-10 DIAGNOSIS — J910 Malignant pleural effusion: Secondary | ICD-10-CM | POA: Diagnosis not present

## 2016-01-10 DIAGNOSIS — C3490 Malignant neoplasm of unspecified part of unspecified bronchus or lung: Secondary | ICD-10-CM | POA: Diagnosis not present

## 2016-01-10 LAB — TRIGLYCERIDES, BODY FLUIDS: TRIGLYCERIDES FL: 130 mg/dL

## 2016-01-10 NOTE — Discharge Summary (Signed)
Home health set up with [facility].

## 2016-01-10 NOTE — Op Note (Signed)
NAME:  Cameron Chandler, Cameron Chandler                  ACCOUNT NO.:  MEDICAL RECORD NO.:  [id number]  LOCATION:                                 FACILITY:  PHYSICIAN:  Revonda Standard. Roxan Hockey, M.D.DATE OF BIRTH:  02-11-1956  DATE OF PROCEDURE:  01/09/2016 DATE OF DISCHARGE:                              OPERATIVE REPORT   PREOPERATIVE DIAGNOSIS:  Malignant right pleural effusion.  POSTOPERATIVE DIAGNOSIS: Malignant right pleural effusion.  PROCEDURE: 1. Insertion of right pleural drainage catheter. 2. Insertion of Port-A-Cath left subclavian vein.  SURGEON:  Revonda Standard. Roxan Hockey, M.D.  ANESTHESIA:  General.  FINDINGS:  Pleural space accessed on 1st attempt, wire confirmed in pleural space, 1 L of off-white cloudy fluid evacuated. Left subclavian vein accessed on 1st pass.  Wire passed easily.  Position of the catheter at the junction of the SVC and right atrium.  CLINICAL NOTE:  Cameron Chandler is a 60 year old man with stage IV lung cancer.  He has a malignant right pleural effusion.  He has bilateral effusions, but the right has been drained on multiple occasions and recurred each time with return of symptoms.  He presents today for pleural catheter placement and Port-A-Cath placement for scheduled chemotherapy to begin next week.  It was noted to have significant ascites on exam.  Because of the patient's inability to lie flat, anesthesia elected to perform the procedure under general.  OPERATIVE NOTE:  Cameron Chandler was brought to the operating room on January 09, 2016. He had induction of general anesthesia. He did have hypotension on induction, but responded rapidly to resuscitative measures.  He then remained stable throughout the remainder of the case. The neck, chest, and abdomen were prepped and draped in usual sterile fashion.  Intravenous antibiotics were administered.  A time-out was performed to confirm correct patient, procedure, and site.  The patient was receiving relatively  light anesthesia.  Supplemental local anesthesia was given.  1% lidocaine was used for both procedures. A total of 10 mL of lidocaine was used in all.  Incisions were made for insertion of the pleural catheter as well as an exit site anteriorly.  The pleural space was accessed with a finder needle, the larger needle then was advanced and there was return of cloudy fluid.  A wire was advanced into the pleural space and position was confirmed with fluoroscopy.  The catheter then was tunneled from the exit site to the entry site with the cuff just under the skin at the exit site.  The tract over the wire was dilated, a peel-away sheath introducer was placed, the inner cannula was removed.  The catheter was inserted.  The sheath was removed.  The catheter was connected to suction and 1 L of cloudy off-white fluid was evacuated.  This was sent for triglyceride levels.  Position of the catheter was confirmed in the pleural space with fluoroscopy.  The catheter was secured at the exit site with a 3-0 nylon suture.  The incision was closed with a 3-0 Vicryl subcuticular suture.  Dermabond was applied.  After the drainage was complete, the catheter was capped and dressed in standard fashion.  The patient was placed in Trendelenburg position.  1% lidocaine was used to anesthetize the site for Port-A-Cath insertion in the left subclavian fossa.  The left subclavian vein was accessed using the Seldinger technique.  The vein was accessed on the 1st pass.  The wire was advanced into the right atrium.  Position was confirmed by fluoroscopy.  A pocket then was created for the port.  The peel-away sheath introducer was advanced over the wire.  The inner cannula was removed and the catheter was placed through the peel-away sheath introducer which was then removed.  Position of the catheter at the junction of the superior vena cava and right atrium was confirmed with fluoroscopy.  There was good return  from the port and it flushed easily. A 9.6-French PowerPort was used.  The port was tacked to the pectoralis fascia with 3-0 Vicryl sutures.  It was then flushed with 2.5 mL of concentrated heparin.  The incision then was closed in 2 layers with a running 3-0 Vicryl subcutaneous suture and a 4-0 Vicryl subcuticular suture.  All sponge, needle, and instrument counts were correct at the end of the procedure.  The patient was taken from the operating room to the postanesthetic care unit, extubated and in stable condition.     Revonda Standard Roxan Hockey, M.D.     SCH/MEDQ  D:  01/09/2016  T:  01/10/2016  Job:  [id number]

## 2016-01-13 ENCOUNTER — Telehealth: Payer: Self-pay | Admitting: Nurse Practitioner

## 2016-01-13 ENCOUNTER — Other Ambulatory Visit (HOSPITAL_BASED_OUTPATIENT_CLINIC_OR_DEPARTMENT_OTHER): Payer: Medicare HMO

## 2016-01-13 ENCOUNTER — Ambulatory Visit (HOSPITAL_BASED_OUTPATIENT_CLINIC_OR_DEPARTMENT_OTHER): Payer: Medicare HMO

## 2016-01-13 ENCOUNTER — Other Ambulatory Visit: Payer: Self-pay | Admitting: Nurse Practitioner

## 2016-01-13 ENCOUNTER — Ambulatory Visit
Admission: RE | Admit: 2016-01-13 | Discharge: 2016-01-13 | Disposition: A | Discharge status: Home / Self Care | Payer: Medicare HMO | Source: Ambulatory Visit | Attending: Radiation Oncology | Admitting: Radiation Oncology

## 2016-01-13 ENCOUNTER — Ambulatory Visit (HOSPITAL_BASED_OUTPATIENT_CLINIC_OR_DEPARTMENT_OTHER): Payer: Medicare HMO | Admitting: Nurse Practitioner

## 2016-01-13 ENCOUNTER — Encounter: Payer: Self-pay | Admitting: Nurse Practitioner

## 2016-01-13 VITALS — BP 91/64 | HR 85 | Temp 98.1°F | Resp 17

## 2016-01-13 DIAGNOSIS — Z7901 Long term (current) use of anticoagulants: Secondary | ICD-10-CM | POA: Diagnosis not present

## 2016-01-13 DIAGNOSIS — R609 Edema, unspecified: Secondary | ICD-10-CM

## 2016-01-13 DIAGNOSIS — R600 Localized edema: Secondary | ICD-10-CM | POA: Insufficient documentation

## 2016-01-13 DIAGNOSIS — Z5111 Encounter for antineoplastic chemotherapy: Secondary | ICD-10-CM

## 2016-01-13 DIAGNOSIS — C3491 Malignant neoplasm of unspecified part of right bronchus or lung: Secondary | ICD-10-CM

## 2016-01-13 DIAGNOSIS — E860 Dehydration: Secondary | ICD-10-CM | POA: Insufficient documentation

## 2016-01-13 DIAGNOSIS — Z510 Encounter for antineoplastic radiation therapy: Secondary | ICD-10-CM | POA: Diagnosis not present

## 2016-01-13 LAB — CBC WITH DIFFERENTIAL/PLATELET
BASO%: 0.1 % (ref 0.0–2.0)
BASOS ABS: 0 10*3/uL (ref 0.0–0.1)
EOS ABS: 0.2 10*3/uL (ref 0.0–0.5)
EOS%: 1.3 % (ref 0.0–7.0)
HCT: 41.2 % (ref 38.4–49.9)
HEMOGLOBIN: 13.6 g/dL (ref 13.0–17.1)
LYMPH#: 0.5 10*3/uL — AB (ref 0.9–3.3)
LYMPH%: 3.5 % — ABNORMAL LOW (ref 14.0–49.0)
MCH: 26 pg — ABNORMAL LOW (ref 27.2–33.4)
MCHC: 33 g/dL (ref 32.0–36.0)
MCV: 78.8 fL — AB (ref 79.3–98.0)
MONO#: 1.1 10*3/uL — ABNORMAL HIGH (ref 0.1–0.9)
MONO%: 7.6 % (ref 0.0–14.0)
NEUT#: 12.6 10*3/uL — ABNORMAL HIGH (ref 1.5–6.5)
NEUT%: 87.5 % — ABNORMAL HIGH (ref 39.0–75.0)
NRBC: 0 % (ref 0–0)
Platelets: 318 10*3/uL (ref 140–400)
RBC: 5.23 10*6/uL (ref 4.20–5.82)
RDW: 18.2 % — AB (ref 11.0–14.6)
WBC: 14.4 10*3/uL — ABNORMAL HIGH (ref 4.0–10.3)

## 2016-01-13 LAB — COMPREHENSIVE METABOLIC PANEL
ALK PHOS: 47 U/L (ref 40–150)
ALT: 30 U/L (ref 0–55)
AST: 28 U/L (ref 5–34)
Anion Gap: 6 mEq/L (ref 3–11)
BUN: 24.1 mg/dL (ref 7.0–26.0)
CO2: 25 mEq/L (ref 22–29)
CREATININE: 0.9 mg/dL (ref 0.7–1.3)
Calcium: 8.1 mg/dL — ABNORMAL LOW (ref 8.4–10.4)
Chloride: 108 mEq/L (ref 98–109)
GLUCOSE: 114 mg/dL (ref 70–140)
Potassium: 4.6 mEq/L (ref 3.5–5.1)
SODIUM: 139 meq/L (ref 136–145)
TOTAL PROTEIN: 4.3 g/dL — AB (ref 6.4–8.3)

## 2016-01-13 MED ORDER — SODIUM CHLORIDE 0.9 % IV SOLN
475.0000 mg/m2 | Freq: Once | INTRAVENOUS | Status: AC
  Administered 2016-01-13: 1000 mg via INTRAVENOUS
  Filled 2016-01-13: qty 40

## 2016-01-13 MED ORDER — SODIUM CHLORIDE 0.9 % IV SOLN
Freq: Once | INTRAVENOUS | Status: AC
  Administered 2016-01-13: 11:00:00 via INTRAVENOUS

## 2016-01-13 MED ORDER — SODIUM CHLORIDE 0.9 % IV SOLN
Freq: Once | INTRAVENOUS | Status: AC
  Administered 2016-01-13: 10:00:00 via INTRAVENOUS

## 2016-01-13 MED ORDER — CARBOPLATIN CHEMO INJECTION 600 MG/60ML
750.0000 mg | Freq: Once | INTRAVENOUS | Status: AC
  Administered 2016-01-13: 750 mg via INTRAVENOUS
  Filled 2016-01-13: qty 75

## 2016-01-13 MED ORDER — SODIUM CHLORIDE 0.9 % IV SOLN
10.0000 mg | Freq: Once | INTRAVENOUS | Status: AC
  Administered 2016-01-13: 10 mg via INTRAVENOUS
  Filled 2016-01-13: qty 1

## 2016-01-13 MED ORDER — SODIUM CHLORIDE 0.9% FLUSH
10.0000 mL | INTRAVENOUS | Status: DC | PRN
  Administered 2016-01-13: 10 mL
  Filled 2016-01-13: qty 10

## 2016-01-13 MED ORDER — SONAFINE EX EMUL
1.0000 "application " | Freq: Two times a day (BID) | CUTANEOUS | Status: DC
  Administered 2016-01-13: 1 via TOPICAL

## 2016-01-13 MED ORDER — PALONOSETRON HCL INJECTION 0.25 MG/5ML
0.2500 mg | Freq: Once | INTRAVENOUS | Status: AC
  Administered 2016-01-13: 0.25 mg via INTRAVENOUS

## 2016-01-13 MED ORDER — HEPARIN SOD (PORK) LOCK FLUSH 100 UNIT/ML IV SOLN
500.0000 [IU] | Freq: Once | INTRAVENOUS | Status: AC | PRN
Start: 2016-01-13 — End: 2016-01-13
  Administered 2016-01-13: 500 [IU]
  Filled 2016-01-13: qty 5

## 2016-01-13 NOTE — Progress Notes (Signed)
Selena Lesser, NP called to see pt chair side d/t lower extremity edema.  Cyndee to request vascular consult for patient.  Bilat legs wrapped with kerlex prior to discharge.

## 2016-01-13 NOTE — Progress Notes (Signed)
Patient education  Done, sonafine cream given, my business card and Radiation therapy and you book,,  Discussed ways to manage side effects, skin irritation, fatigue, mouth and throat ,irritation to esophagus, loss appetite, may need to eat 5-6 smaller softer meals,, increase protein in diet, stay hydrated,drink plenty fluids, sees MD and staff RN weekly and prn, apply sonafine to affected area daily after radiation, verbal understanding, teach back given 2:10 PM

## 2016-01-13 NOTE — Assessment & Plan Note (Signed)
Patient presented to the [facility] today to receive cycle one of the carboplatin/Alimta chemotherapy regimen.  He is also scheduled to initiate radiation treatments   This week as well.  Patient is scheduled to return for follow-up visit with Dr. Julien Nordmann on 01/20/2016.  He is scheduled for labs and his next cycle of chemotherapy on 02/03/2016.
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
## 2016-01-13 NOTE — Assessment & Plan Note (Signed)
Patient has history of left upper extremity DVT; and continues to take Coumadin as previously directed.

## 2016-01-13 NOTE — Patient Instructions (Signed)
[facility] Discharge Instructions for Patients Receiving Chemotherapy  Today you received the following chemotherapy agents:  Alimta, Carboplatin  To help prevent nausea and vomiting after your treatment, we encourage you to take your nausea medication as prescribed.   If you develop nausea and vomiting that is not controlled by your nausea medication, call the clinic.   BELOW ARE SYMPTOMS THAT SHOULD BE REPORTED IMMEDIATELY:  *FEVER GREATER THAN 100.5 F  *CHILLS WITH OR WITHOUT FEVER  NAUSEA AND VOMITING THAT IS NOT CONTROLLED WITH YOUR NAUSEA MEDICATION  *UNUSUAL SHORTNESS OF BREATH  *UNUSUAL BRUISING OR BLEEDING  TENDERNESS IN MOUTH AND THROAT WITH OR WITHOUT PRESENCE OF ULCERS  *URINARY PROBLEMS  *BOWEL PROBLEMS  UNUSUAL RASH Items with * indicate a potential emergency and should be followed up as soon as possible.  Feel free to call the clinic you have any questions or concerns. The clinic phone number is [phone].  Please show the [place] at check-in to the Emergency Department and triage nurse.  Carboplatin injection What is this medicine? CARBOPLATIN (KAR boe pla tin) is a chemotherapy drug. It targets fast dividing cells, like cancer cells, and causes these cells to die. This medicine is used to treat ovarian cancer and many other cancers. This medicine may be used for other purposes; ask your health care provider or pharmacist if you have questions. What should I tell my health care provider before I take this medicine? They need to know if you have any of these conditions: -blood disorders -hearing problems -kidney disease -recent or ongoing radiation therapy -an unusual or allergic reaction to carboplatin, cisplatin, other chemotherapy, other medicines, foods, dyes, or preservatives -pregnant or trying to get pregnant -breast-feeding How should I use this medicine? This drug is usually given as an infusion into a vein. It is  administered in a hospital or clinic by a specially trained health care professional. Talk to your pediatrician regarding the use of this medicine in children. Special care may be needed. Overdosage: If you think you have taken too much of this medicine contact a poison control center or emergency room at once. NOTE: This medicine is only for you. Do not share this medicine with others. What if I miss a dose? It is important not to miss a dose. Call your doctor or health care professional if you are unable to keep an appointment. What may interact with this medicine? -medicines for seizures -medicines to increase blood counts like filgrastim, pegfilgrastim, sargramostim -some antibiotics like amikacin, gentamicin, neomycin, streptomycin, tobramycin -vaccines Talk to your doctor or health care professional before taking any of these medicines: -acetaminophen -aspirin -ibuprofen -ketoprofen -naproxen This list may not describe all possible interactions. Give your health care provider a list of all the medicines, herbs, non-prescription drugs, or dietary supplements you use. Also tell them if you smoke, drink alcohol, or use illegal drugs. Some items may interact with your medicine. What should I watch for while using this medicine? Your condition will be monitored carefully while you are receiving this medicine. You will need important blood work done while you are taking this medicine. This drug may make you feel generally unwell. This is not uncommon, as chemotherapy can affect healthy cells as well as cancer cells. Report any side effects. Continue your course of treatment even though you feel ill unless your doctor tells you to stop. In some cases, you may be given additional medicines to help with side effects. Follow all directions for their  use. Call your doctor or health care professional for advice if you get a fever, chills or sore throat, or other symptoms of a cold or flu. Do not  treat yourself. This drug decreases your body's ability to fight infections. Try to avoid being around people who are sick. This medicine may increase your risk to bruise or bleed. Call your doctor or health care professional if you notice any unusual bleeding. Be careful brushing and flossing your teeth or using a toothpick because you may get an infection or bleed more easily. If you have any dental work done, tell your dentist you are receiving this medicine. Avoid taking products that contain aspirin, acetaminophen, ibuprofen, naproxen, or ketoprofen unless instructed by your doctor. These medicines may hide a fever. Do not become pregnant while taking this medicine. Women should inform their doctor if they wish to become pregnant or think they might be pregnant. There is a potential for serious side effects to an unborn child. Talk to your health care professional or pharmacist for more information. Do not breast-feed an infant while taking this medicine. What side effects may I notice from receiving this medicine? Side effects that you should report to your doctor or health care professional as soon as possible: -allergic reactions like skin rash, itching or hives, swelling of the face, lips, or tongue -signs of infection - fever or chills, cough, sore throat, pain or difficulty passing urine -signs of decreased platelets or bleeding - bruising, pinpoint red spots on the skin, black, tarry stools, nosebleeds -signs of decreased red blood cells - unusually weak or tired, fainting spells, lightheadedness -breathing problems -changes in hearing -changes in vision -chest pain -high blood pressure -low blood counts - This drug may decrease the number of white blood cells, red blood cells and platelets. You may be at increased risk for infections and bleeding. -nausea and vomiting -pain, swelling, redness or irritation at the injection site -pain, tingling, numbness in the hands or feet -problems  with balance, talking, walking -trouble passing urine or change in the amount of urine Side effects that usually do not require medical attention (report to your doctor or health care professional if they continue or are bothersome): -hair loss -loss of appetite -metallic taste in the mouth or changes in taste This list may not describe all possible side effects. Call your doctor for medical advice about side effects. You may report side effects to FDA at 1-800-FDA-1088. Where should I keep my medicine? This drug is given in a hospital or clinic and will not be stored at home. NOTE: This sheet is a summary. It may not cover all possible information. If you have questions about this medicine, talk to your doctor, pharmacist, or health care provider.    2016, Elsevier/Gold Standard. (2007-06-21 14:38:05) Pemetrexed injection What is this medicine? PEMETREXED (PEM e TREX ed) is a chemotherapy drug. This medicine affects cells that are rapidly growing, such as cancer cells and cells in your mouth and stomach. It is usually used to treat lung cancers like non-small cell lung cancer and mesothelioma. It may also be used to treat other cancers. This medicine may be used for other purposes; ask your health care provider or pharmacist if you have questions. What should I tell my health care provider before I take this medicine? They need to know if you have any of these conditions: -if you frequently drink alcohol containing beverages -infection (especially a virus infection such as chickenpox, cold sores, or herpes) -kidney disease -  liver disease -low blood counts, like low platelets, red bloods, or white blood cells -an unusual or allergic reaction to pemetrexed, mannitol, other medicines, foods, dyes, or preservatives -pregnant or trying to get pregnant -breast-feeding How should I use this medicine? This drug is given as an infusion into a vein. It is administered in a hospital or clinic by a  specially trained health care professional. Talk to your pediatrician regarding the use of this medicine in children. Special care may be needed. Overdosage: If you think you have taken too much of this medicine contact a poison control center or emergency room at once. NOTE: This medicine is only for you. Do not share this medicine with others. What if I miss a dose? It is important not to miss your dose. Call your doctor or health care professional if you are unable to keep an appointment. What may interact with this medicine? -aspirin and aspirin-like medicines -medicines to increase blood counts like filgrastim, pegfilgrastim, sargramostim -methotrexate -NSAIDS, medicines for pain and inflammation, like ibuprofen or naproxen -probenecid -pyrimethamine -vaccines Talk to your doctor or health care professional before taking any of these medicines: -acetaminophen -aspirin -ibuprofen -ketoprofen -naproxen This list may not describe all possible interactions. Give your health care provider a list of all the medicines, herbs, non-prescription drugs, or dietary supplements you use. Also tell them if you smoke, drink alcohol, or use illegal drugs. Some items may interact with your medicine. What should I watch for while using this medicine? Visit your doctor for checks on your progress. This drug may make you feel generally unwell. This is not uncommon, as chemotherapy can affect healthy cells as well as cancer cells. Report any side effects. Continue your course of treatment even though you feel ill unless your doctor tells you to stop. In some cases, you may be given additional medicines to help with side effects. Follow all directions for their use. Call your doctor or health care professional for advice if you get a fever, chills or sore throat, or other symptoms of a cold or flu. Do not treat yourself. This drug decreases your body's ability to fight infections. Try to avoid being around  people who are sick. This medicine may increase your risk to bruise or bleed. Call your doctor or health care professional if you notice any unusual bleeding. Be careful brushing and flossing your teeth or using a toothpick because you may get an infection or bleed more easily. If you have any dental work done, tell your dentist you are receiving this medicine. Avoid taking products that contain aspirin, acetaminophen, ibuprofen, naproxen, or ketoprofen unless instructed by your doctor. These medicines may hide a fever. Call your doctor or health care professional if you get diarrhea or mouth sores. Do not treat yourself. To protect your kidneys, drink water or other fluids as directed while you are taking this medicine. Men and women must use effective birth control while taking this medicine. You may also need to continue using effective birth control for a time after stopping this medicine. Do not become pregnant while taking this medicine. Tell your doctor right away if you think that you or your partner might be pregnant. There is a potential for serious side effects to an unborn child. Talk to your health care professional or pharmacist for more information. Do not breast-feed an infant while taking this medicine. This medicine may lower sperm counts. What side effects may I notice from receiving this medicine? Side effects that you should report  to your doctor or health care professional as soon as possible: -allergic reactions like skin rash, itching or hives, swelling of the face, lips, or tongue -low blood counts - this medicine may decrease the number of white blood cells, red blood cells and platelets. You may be at increased risk for infections and bleeding. -signs of infection - fever or chills, cough, sore throat, pain or difficulty passing urine -signs of decreased platelets or bleeding - bruising, pinpoint red spots on the skin, black, tarry stools, blood in the urine -signs of  decreased red blood cells - unusually weak or tired, fainting spells, lightheadedness -breathing problems, like a dry cough -changes in emotions or moods -chest pain -confusion -diarrhea -high blood pressure -mouth or throat sores or ulcers -pain, swelling, warmth in the leg -pain on swallowing -swelling of the ankles, feet, hands -trouble passing urine or change in the amount of urine -vomiting -yellowing of the eyes or skin Side effects that usually do not require medical attention (report to your doctor or health care professional if they continue or are bothersome): -hair loss -loss of appetite -nausea -stomach upset This list may not describe all possible side effects. Call your doctor for medical advice about side effects. You may report side effects to FDA at 1-800-FDA-1088. Where should I keep my medicine? This drug is given in a hospital or clinic and will not be stored at home. NOTE: This sheet is a summary. It may not cover all possible information. If you have questions about this medicine, talk to your doctor, pharmacist, or health care provider.    2016, Elsevier/Gold Standard. (2007-10-18 13:24:03)

## 2016-01-13 NOTE — Assessment & Plan Note (Signed)
.    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  Patient states that he has noticed increased edema to his bilateral lower extremity; they have now begun to weep/ooze clear fluid on a more frequent basis.  He has been wrapping his legs with only minimal effectiveness.  Exam today reveals bilateral lower extremity peripheral edema; with the left slightly greater than the right.  Patient has some oozing of clear/serous fluid to the bilateral lower extremities at the calves. No obvious infection to the site.  Also, it is noted that the left lower extremity from the knee down isvery cool to the touch.  Patient states that his leg has been cool to the touch for quite some time.  He states that his left leg/foot chronically aches. He confirmed that he has been taking his Coumadin with no missed doses. There is no cyanosis to his foot and patient observed with full range of motion.  Nurse was instructed to re-wrap  Patient's bilateral lower extremities  Also, patient has a home health nurse to manage his Pleurx catheter drainage; and will request that the [place] also incorporate some wound care/leg care to his regimen as well as at all possible.  Reviewed all findings with Dr. Julien Nordmann; and he recommended that patient obtain a vascular surgeon.  Referral for further evaluation.  Also, patient was advised to go directly to the emergency department for any worsening discoloration/darkness or coolness to the extremity.

## 2016-01-13 NOTE — Assessment & Plan Note (Signed)
Patient appeared mildly dehydrated today; a blood pressure was down to 96/64.  Dr. Julien Nordmann ordered some additional IV fluid rehydration for the patient today.  Note: Patient takes several different blood pressure medications on a daily basis; and confirm that he did take them earlier this morning.  Also, patient takes Lasix on a daily basis as well.

## 2016-01-13 NOTE — Telephone Encounter (Signed)
This provider has tried to call 3 different [facility] home health facilities in the [place] region;but has been informed that there is no patient by that name or date of birth in their system. Then called patient's daughter; and advised that she had the home health a [place] at home and would be able to give [place] some contact phone numbers by tomorrow.  The plan is for this provider to call the patient's daughter back tomorrow morning; to obtain the contact numbers for the home health care team.

## 2016-01-14 ENCOUNTER — Telehealth: Payer: Self-pay | Admitting: *Deleted

## 2016-01-14 ENCOUNTER — Ambulatory Visit
Admission: RE | Admit: 2016-01-14 | Discharge: 2016-01-14 | Disposition: A | Discharge status: Home / Self Care | Payer: Medicare HMO | Source: Ambulatory Visit | Attending: Radiation Oncology | Admitting: Radiation Oncology

## 2016-01-14 DIAGNOSIS — Z510 Encounter for antineoplastic radiation therapy: Secondary | ICD-10-CM | POA: Diagnosis not present

## 2016-01-14 NOTE — Telephone Encounter (Signed)
Returned call back to [place] at [facility] called asking for order for patient 's leg wound care, stated he is seeing Medical oncology and radiation,  informed her that we are treating his spine and didn't order,wound care for this patient, transferred order to medical oncology Rn [phone]  To see if Medical OPncology ordered this 10:15 AM

## 2016-01-15 ENCOUNTER — Ambulatory Visit
Admission: RE | Admit: 2016-01-15 | Discharge: 2016-01-15 | Disposition: A | Discharge status: Home / Self Care | Payer: Medicare HMO | Source: Ambulatory Visit | Attending: Radiation Oncology | Admitting: Radiation Oncology

## 2016-01-15 ENCOUNTER — Encounter: Payer: Self-pay | Admitting: Radiation Oncology

## 2016-01-15 VITALS — BP 84/60 | HR 60 | Temp 98.0°F | Resp 18 | Ht 66.0 in | Wt 211.0 lb

## 2016-01-15 DIAGNOSIS — Z510 Encounter for antineoplastic radiation therapy: Secondary | ICD-10-CM | POA: Diagnosis not present

## 2016-01-15 DIAGNOSIS — C7951 Secondary malignant neoplasm of bone: Secondary | ICD-10-CM

## 2016-01-15 MED ORDER — OXYCODONE-ACETAMINOPHEN 5-325 MG PO TABS: 1.0000 | ORAL_TABLET | Freq: Four times a day (QID) | ORAL | 0 refills | Status: DC | PRN

## 2016-01-15 NOTE — Progress Notes (Signed)
Department of Radiation Oncology  Phone:  [phone] Fax:        [phone]  Weekly Treatment Note    Name: Cameron Chandler Date: 01/15/2016 MRN: [id number] DOB: [birth year]   Diagnosis:     ICD-9-CM ICD-10-CM   1. 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 aspirin EC 81 MG tablet Take 81 mg by mouth daily.    Marland Kitchen dexamethasone (DECADRON) 4 MG tablet 4 mg by mouth twice a day the day before, day of and day after the chemotherapy every 3 weeks 40 tablet 1  . folic acid (FOLVITE) 1 MG tablet Take 1 tablet (1 mg total) by mouth daily. 30 tablet 4  . furosemide (LASIX) 40 MG tablet Take 1 tablet (40 mg total) by mouth daily. 14 tablet 1  . gabapentin (NEURONTIN) 100 MG capsule Take 1 capsule (100 mg total) by mouth 3 (three) times daily. 90 capsule 0  . hydrochlorothiazide (HYDRODIURIL) 25 MG tablet Take 25 mg by mouth daily.    Marland Kitchen HYDROcodone-acetaminophen (NORCO) 7.5-325 MG tablet Take 1 tablet by mouth 3 (three) times daily.  0  . insulin detemir (LEVEMIR) 100 unit/ml SOLN Inject 30 Units into the skin at bedtime.    Marland Kitchen levothyroxine (SYNTHROID, LEVOTHROID) 25 MCG tablet Take 1 tablet (25 mcg total) by mouth daily before breakfast. 30 tablet 11  . lidocaine-prilocaine (EMLA) cream Apply 1 application topically as needed. 30 g 0  . meloxicam (MOBIC) 7.5 MG tablet Take 7.5 mg by mouth daily.    . metFORMIN (GLUCOPHAGE) 500 MG tablet Take 500 mg by mouth 2 (two) times daily with a meal.    . metoprolol tartrate (LOPRESSOR) 25 MG tablet Take 12.5 mg by mouth 2 (two) times daily.    Marland Kitchen oxyCODONE-acetaminophen (PERCOCET/ROXICET) 5-325 MG tablet Take 1-2 tablets by mouth every 6 (six) hours as needed for severe pain. 40 tablet 0  . potassium chloride (K-DUR,KLOR-CON) 10 MEQ tablet Take 1 tablet (10 mEq total) by mouth daily. 14 tablet 1  . simvastatin (ZOCOR)  80 MG tablet Take 80 mg by mouth at bedtime.    Marland Kitchen warfarin (COUMADIN) 5 MG tablet Take 5 mg by mouth daily. Or as directed Will stop prior to procedure  1  . nitroGLYCERIN (NITROSTAT) 0.4 MG SL tablet Place 0.4 mg under the tongue every 5 (five) minutes as needed for chest pain.    Marland Kitchen prochlorperazine (COMPAZINE) 10 MG tablet Take 1 tablet (10 mg total) by mouth every 6 (six) hours as needed for nausea or vomiting. (Patient not taking: Reported on 01/15/2016) 30 tablet 0  . Wound Dressings (SONAFINE) Apply 1 application topically daily.     No current facility-administered medications for this encounter.      ALLERGIES: No known allergies   LABORATORY DATA:  Lab Results  Component Value Date   WBC 14.4 (H) 01/13/2016   HGB 13.6 01/13/2016   HCT 41.2 01/13/2016   MCV 78.8 (L) 01/13/2016   PLT 318 01/13/2016   Lab Results  Component Value Date   NA 139 01/13/2016   K 4.6 01/13/2016   CL 104 01/09/2016   CO2 25 01/13/2016   Lab Results  Component Value Date   ALT 30 01/13/2016   AST 28 01/13/2016   ALKPHOS 47 01/13/2016   BILITOT <0.22 01/13/2016     NARRATIVE: Cameron Chandler was seen  today for weekly treatment management. The chart was checked and the patient's films were reviewed.  Cameron Chandler has received 3 treatment to T1/T2. Skin with normal color. Reports neck, left leg, and left ankle pain. It is hard for him to walk, taking Oxycodone for the pain. Left leg has skin irritation being managed by [facility] nurse. A nurse also visits every other day to drain fluid from his lungs. Reports SOB and a nonproductive cough. The patient reports his PCP stopped his Valsartan. Denies dizziness or chest pain.  PHYSICAL EXAMINATION: height is '5\' 6"'$  (1.676 m) and weight is 211 lb (95.7 kg). His oral temperature is 98 F (36.7 C). His blood pressure is 84/60 (abnormal) and his pulse is 60. His respiration is 18 and oxygen saturation is 88% (abnormal).   The patient  presents in a wheelchair. His left leg is wrapped in bandages.  ASSESSMENT: The patient is doing satisfactorily with treatment.  The patient's manual blood pressure and pulse were much better. He began today discontinuing his blood pressure medication. The patient denies any dizziness or feeling ill overall. The left leg is causing substantial difficulty and is meeting managed closely as noted above.  PLAN: We will continue with the patient's radiation treatment as planned. I have refilled the patient's Percocet.  ------------------------------------------------  Cameron Gross, MD, PhD  This document serves as a record of services personally performed by Cameron Rudd, MD. It was created on his behalf by Cameron Chandler, a trained medical scribe. The creation of this record is based on the scribe's personal observations and the provider's statements to them. This document has been checked and approved by the attending provider.

## 2016-01-15 NOTE — Progress Notes (Addendum)
Cameron Chandler has received 3 treatment to T1/T2.  Skin with normal color.  Having pain to neck,left leg and neck 6/10 hard for him to walk, taking Oxycodone for pain.   Left leg has skin irritation being managed by [facility] nurse visit every other day to drain fluid from his lungs.  Reports SOB and coughing nonproductive.  Appetite is good.  Denies fatigue. Wt Readings from Last 3 Encounters:  01/09/16 212 lb (96.2 kg)  01/01/16 212 lb 6.4 oz (96.3 kg)  12/31/15 206 lb (93.4 kg)  BP (!) 84/64 (BP Location: Right Arm, Patient Position: Sitting, Cuff Size: Normal)   Pulse (!) 53   Temp 98 F (36.7 C) (Oral)   Resp 18   Ht '5\' 6"'$  (1.676 m)   Wt 211 lb (95.7 kg)   SpO2 (!) 88%   BMI 34.06 kg/m   BP (!) 85/68 (BP Location: Right Arm, Patient Position: Sitting, Cuff Size: Normal)   Pulse (!) 35   Temp 98 F (36.7 C) (Oral)   Resp 18   Ht '5\' 6"'$  (1.676 m)   Wt 211 lb (95.7 kg)   SpO2 (!) 88%   BMI 34.06 kg/m   BP (!) 84/60 (BP Location: Right Arm, Patient Position: Sitting, Cuff Size: Normal)   Pulse 60   Temp 98 F (36.7 C) (Oral)   Resp 18   Ht '5\' 6"'$  (1.676 m)   Wt 211 lb (95.7 kg)   SpO2 (!) 88%   BMI 34.06 kg/m  taken manually  1600  92/60 58 93 % 84 pulse taken manually Dr. Lisbeth Renshaw made aware of vital signs

## 2016-01-16 ENCOUNTER — Ambulatory Visit
Admission: RE | Admit: 2016-01-16 | Discharge: 2016-01-16 | Disposition: A | Discharge status: Home / Self Care | Payer: Medicare HMO | Source: Ambulatory Visit | Attending: Radiation Oncology | Admitting: Radiation Oncology

## 2016-01-16 DIAGNOSIS — Z510 Encounter for antineoplastic radiation therapy: Secondary | ICD-10-CM | POA: Diagnosis not present

## 2016-01-17 ENCOUNTER — Other Ambulatory Visit: Payer: Self-pay | Admitting: Cardiovascular Disease

## 2016-01-17 ENCOUNTER — Telehealth: Payer: Self-pay | Admitting: *Deleted

## 2016-01-17 ENCOUNTER — Ambulatory Visit
Admission: RE | Admit: 2016-01-17 | Discharge: 2016-01-17 | Disposition: A | Discharge status: Home / Self Care | Payer: Medicare HMO | Source: Ambulatory Visit | Attending: Radiation Oncology | Admitting: Radiation Oncology

## 2016-01-17 ENCOUNTER — Encounter: Payer: Self-pay | Admitting: Radiation Oncology

## 2016-01-17 DIAGNOSIS — Z510 Encounter for antineoplastic radiation therapy: Secondary | ICD-10-CM | POA: Diagnosis not present

## 2016-01-17 NOTE — Telephone Encounter (Signed)
-----   Message from Paulla Dolly, RN sent at 01/13/2016  2:20 PM EDT ----- Regarding: first chemo Dr. Carollee Herter: [phone] First treatment with Alimta / Carbo.   No complications / reactions. Selena Lesser did see pt chair side to assess lower extremity edema and stated she would put in a vascular referral for pt due to weeping and cold extremity

## 2016-01-17 NOTE — Telephone Encounter (Signed)
Chemotherapy follow up call placed. Pt verbalized he feels great, no concerns at this time.

## 2016-01-20 ENCOUNTER — Ambulatory Visit: Payer: Medicare HMO | Admitting: Internal Medicine

## 2016-01-20 ENCOUNTER — Ambulatory Visit
Admission: RE | Admit: 2016-01-20 | Discharge: 2016-01-20 | Disposition: A | Discharge status: Home / Self Care | Payer: Medicare HMO | Source: Ambulatory Visit | Attending: Radiation Oncology | Admitting: Radiation Oncology

## 2016-01-20 DIAGNOSIS — Z510 Encounter for antineoplastic radiation therapy: Secondary | ICD-10-CM | POA: Diagnosis not present

## 2016-01-21 ENCOUNTER — Ambulatory Visit
Admission: RE | Admit: 2016-01-21 | Discharge: 2016-01-21 | Disposition: A | Discharge status: Home / Self Care | Payer: Medicare HMO | Source: Ambulatory Visit | Attending: Radiation Oncology | Admitting: Radiation Oncology

## 2016-01-21 ENCOUNTER — Telehealth: Payer: Self-pay | Admitting: Internal Medicine

## 2016-01-21 ENCOUNTER — Other Ambulatory Visit: Payer: Self-pay | Admitting: Vascular Surgery

## 2016-01-21 DIAGNOSIS — Z510 Encounter for antineoplastic radiation therapy: Secondary | ICD-10-CM | POA: Diagnosis not present

## 2016-01-21 DIAGNOSIS — R609 Edema, unspecified: Secondary | ICD-10-CM

## 2016-01-21 NOTE — Telephone Encounter (Signed)
Patient missed appointment on 10/23 called to have it rescheduled. Rescheduled to next available 11/20.

## 2016-01-22 ENCOUNTER — Ambulatory Visit
Admission: RE | Admit: 2016-01-22 | Discharge: 2016-01-22 | Disposition: A | Discharge status: Home / Self Care | Payer: Medicare HMO | Source: Ambulatory Visit | Attending: Radiation Oncology | Admitting: Radiation Oncology

## 2016-01-22 ENCOUNTER — Ambulatory Visit: Payer: Medicare HMO

## 2016-01-22 DIAGNOSIS — Z510 Encounter for antineoplastic radiation therapy: Secondary | ICD-10-CM | POA: Diagnosis not present

## 2016-01-23 ENCOUNTER — Ambulatory Visit
Admission: RE | Admit: 2016-01-23 | Discharge: 2016-01-23 | Disposition: A | Discharge status: Home / Self Care | Payer: Medicare HMO | Source: Ambulatory Visit | Attending: Radiation Oncology | Admitting: Radiation Oncology

## 2016-01-23 DIAGNOSIS — Z510 Encounter for antineoplastic radiation therapy: Secondary | ICD-10-CM | POA: Diagnosis not present

## 2016-01-24 ENCOUNTER — Encounter: Payer: Self-pay | Admitting: Radiation Oncology

## 2016-01-24 ENCOUNTER — Ambulatory Visit
Admission: RE | Admit: 2016-01-24 | Discharge: 2016-01-24 | Disposition: A | Discharge status: Home / Self Care | Payer: Medicare HMO | Source: Ambulatory Visit | Attending: Radiation Oncology | Admitting: Radiation Oncology

## 2016-01-24 ENCOUNTER — Encounter (HOSPITAL_COMMUNITY): Payer: Self-pay

## 2016-01-24 VITALS — BP 99/82 | HR 98 | Temp 99.1°F | Resp 20

## 2016-01-24 DIAGNOSIS — Z510 Encounter for antineoplastic radiation therapy: Secondary | ICD-10-CM | POA: Diagnosis not present

## 2016-01-24 DIAGNOSIS — C7951 Secondary malignant neoplasm of bone: Secondary | ICD-10-CM

## 2016-01-24 MED ORDER — HYDROCODONE-ACETAMINOPHEN 7.5-325 MG PO TABS: 1.0000 | ORAL_TABLET | Freq: Three times a day (TID) | ORAL | 0 refills | Status: DC

## 2016-01-24 NOTE — Progress Notes (Signed)
Weekly rad txs T1-T2 10/10 complete, pain in left leg 10/10, throbbing stated, ran out of pain medication hydrocodone 7.5/'325mg'$  request refill, left hand swollen/edem and in left leg pitting edma,  No nausea, ate salmon and fatback today with soda , weak , unable to get weight,, congestive cough non productive,  Will call him Monday and give 1 month follow up appt 4:34 PM BP 99/82 (BP Location: Right Arm, Patient Position: Sitting, Cuff Size: Normal)   Pulse 98   Temp 99.1 F (37.3 C) (Oral)   Resp 20   Wt Readings from Last 3 Encounters:  01/15/16 211 lb (95.7 kg)  01/09/16 212 lb (96.2 kg)  01/01/16 212 lb 6.4 oz (96.3 kg)

## 2016-01-24 NOTE — Progress Notes (Signed)
Department of Radiation Oncology  Phone:  [phone] Fax:        [phone]  Weekly Treatment Note    Name: Cameron Chandler Date: 01/24/2016 MRN: [id number] DOB: 1955/05/07   Diagnosis:   No diagnosis found.   Current dose: 30 Gy  Current fraction: 10   MEDICATIONS: Current Outpatient Prescriptions  Medication Sig Dispense Refill  . aspirin EC 81 MG tablet Take 81 mg by mouth daily.    Marland Kitchen dexamethasone (DECADRON) 4 MG tablet 4 mg by mouth twice a day the day before, day of and day after the chemotherapy every 3 weeks 40 tablet 1  . folic acid (FOLVITE) 1 MG tablet Take 1 tablet (1 mg total) by mouth daily. 30 tablet 4  . furosemide (LASIX) 40 MG tablet Take 1 tablet (40 mg total) by mouth daily. 14 tablet 1  . gabapentin (NEURONTIN) 100 MG capsule Take 1 capsule (100 mg total) by mouth 3 (three) times daily. 90 capsule 0  . hydrochlorothiazide (HYDRODIURIL) 25 MG tablet Take 25 mg by mouth daily.    Marland Kitchen HYDROcodone-acetaminophen (NORCO) 7.5-325 MG tablet Take 1 tablet by mouth 3 (three) times daily.  0  . insulin detemir (LEVEMIR) 100 unit/ml SOLN Inject 30 Units into the skin at bedtime.    Marland Kitchen levothyroxine (SYNTHROID, LEVOTHROID) 25 MCG tablet Take 1 tablet (25 mcg total) by mouth daily before breakfast. 30 tablet 11  . lidocaine-prilocaine (EMLA) cream Apply 1 application topically as needed. 30 g 0  . meloxicam (MOBIC) 7.5 MG tablet Take 7.5 mg by mouth daily.    . metFORMIN (GLUCOPHAGE) 500 MG tablet Take 500 mg by mouth 2 (two) times daily with a meal.    . metoprolol tartrate (LOPRESSOR) 25 MG tablet Take 12.5 mg by mouth 2 (two) times daily.    . nitroGLYCERIN (NITROSTAT) 0.4 MG SL tablet Place 0.4 mg under the tongue every 5 (five) minutes as needed for chest pain.    Marland Kitchen oxyCODONE-acetaminophen (PERCOCET/ROXICET) 5-325 MG tablet Take 1-2 tablets by mouth every 6 (six) hours as needed for severe pain. 40 tablet 0  . potassium chloride (K-DUR,KLOR-CON) 10 MEQ tablet  Take 1 tablet (10 mEq total) by mouth daily. 14 tablet 1  . prochlorperazine (COMPAZINE) 10 MG tablet Take 1 tablet (10 mg total) by mouth every 6 (six) hours as needed for nausea or vomiting. 30 tablet 0  . simvastatin (ZOCOR) 80 MG tablet Take 80 mg by mouth at bedtime.    . simvastatin (ZOCOR) 80 MG tablet TAKE 1 TABLET BY MOUTH DAILY AT 6 P.M. 30 tablet 3  . warfarin (COUMADIN) 5 MG tablet Take 5 mg by mouth daily. Or as directed Will stop prior to procedure  1  . Wound Dressings (SONAFINE) Apply 1 application topically daily.     No current facility-administered medications for this encounter.      ALLERGIES: No known allergies   LABORATORY DATA:  Lab Results  Component Value Date   WBC 14.4 (H) 01/13/2016   HGB 13.6 01/13/2016   HCT 41.2 01/13/2016   MCV 78.8 (L) 01/13/2016   PLT 318 01/13/2016   Lab Results  Component Value Date   NA 139 01/13/2016   K 4.6 01/13/2016   CL 104 01/09/2016   CO2 25 01/13/2016   Lab Results  Component Value Date   ALT 30 01/13/2016   AST 28 01/13/2016   ALKPHOS 47 01/13/2016   BILITOT <0.22 01/13/2016     NARRATIVE: Cameron Chandler  was seen today for weekly treatment management. The chart was checked and the patient's films were reviewed.  Weekly rad txs T1-T2 10/10 complete. Throbbing pain in left leg is a 10/10. He ran out of pain medication, hydrocodone 7.5/'325mg'$  and requests a refill. Swollen left hand and pitting edema in the left leg. Denies nausea, ate salmon and fatback today with soda, weak. Thcongestive cough non productive.  PHYSICAL EXAMINATION: oral temperature is 99.1 F (37.3 C). His blood pressure is 99/82 and his pulse is 98. His respiration is 20.   The patient presents in a wheelchair.  ASSESSMENT: The patient did satisfactorily with treatment.  PLAN: The patient will be called on Monday to be given a 1 month follow up appointment. The patient states he has ongoing management of his left leg next Tuesday. He  is aware we will not be managing his pain long-term related to this. I have refilled the Hydrocodone, 30 tablets.  ------------------------------------------------  Jodelle Gross, MD, PhD  This document serves as a record of services personally performed by Kyung Rudd, MD. It was created on his behalf by Darcus Austin, a trained medical scribe. The creation of this record is based on the scribe's personal observations and the provider's statements to them. This document has been checked and approved by the attending provider.

## 2016-01-27 ENCOUNTER — Other Ambulatory Visit: Payer: Self-pay | Admitting: Thoracic Surgery (Cardiothoracic Vascular Surgery)

## 2016-01-27 DIAGNOSIS — C3490 Malignant neoplasm of unspecified part of unspecified bronchus or lung: Secondary | ICD-10-CM

## 2016-01-28 ENCOUNTER — Ambulatory Visit
Admission: RE | Admit: 2016-01-28 | Discharge: 2016-01-28 | Disposition: A | Discharge status: Home / Self Care | Payer: Medicare HMO | Source: Ambulatory Visit | Attending: Thoracic Surgery (Cardiothoracic Vascular Surgery) | Admitting: Thoracic Surgery (Cardiothoracic Vascular Surgery)

## 2016-01-28 ENCOUNTER — Ambulatory Visit (INDEPENDENT_AMBULATORY_CARE_PROVIDER_SITE_OTHER): Payer: Medicare HMO | Admitting: Thoracic Surgery (Cardiothoracic Vascular Surgery)

## 2016-01-28 ENCOUNTER — Encounter: Payer: Self-pay | Admitting: Thoracic Surgery (Cardiothoracic Vascular Surgery)

## 2016-01-28 VITALS — BP 121/94 | HR 132 | Resp 16 | Ht 66.0 in | Wt 211.0 lb

## 2016-01-28 DIAGNOSIS — Z95828 Presence of other vascular implants and grafts: Secondary | ICD-10-CM | POA: Diagnosis not present

## 2016-01-28 DIAGNOSIS — Z9689 Presence of other specified functional implants: Secondary | ICD-10-CM

## 2016-01-28 DIAGNOSIS — C3490 Malignant neoplasm of unspecified part of unspecified bronchus or lung: Secondary | ICD-10-CM

## 2016-01-28 DIAGNOSIS — Z09 Encounter for follow-up examination after completed treatment for conditions other than malignant neoplasm: Secondary | ICD-10-CM | POA: Diagnosis not present

## 2016-01-28 DIAGNOSIS — J90 Pleural effusion, not elsewhere classified: Secondary | ICD-10-CM

## 2016-01-28 DIAGNOSIS — Z978 Presence of other specified devices: Secondary | ICD-10-CM

## 2016-01-28 NOTE — Progress Notes (Signed)
[address]       [place] [address]             [phone]       HPI: Cameron Chandler returns for a scheduled follow-up visit.  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 He also has generalized anasarca and a left pleural effusion as well. I placed a Pleurx catheter on 01/09/2016 which drained about a liter of an off white cloudy fluid. Checked triglyceride level, it was only 130. He has been draining the catheter every other day. This morning he put out 300 mL.  His primary complaint continues to be severe swelling in his legs. He has some open wounds on the left leg and the leg is wrapped. He is unable to bear weight on his feet.  Past Medical History:  Diagnosis Date  . Adenocarcinoma of right lung, stage 4 ([facility]) 12/03/2015  . Arthritis    back, joint pain  . Bone cancer ([facility])    T 1T2  . Cancer Cameron Chandler) 2011   colon cancer  . Colon cancer Cameron Chandler)    Colon resection and Chemotherapy  . Coronary atherosclerosis of native coronary artery   . Diabetes mellitus without complication ([facility])    takes Meformin and Levemir daily  . DVT (deep venous thrombosis) ([facility])    Left arm  . Dyspnea    lying,sitting,exertion  . Heart attack   . Hypothyroidism    takes Synthroid daily  . Myocardial infarction 2007  . Nocturia   . Other and unspecified hyperlipidemia    takes Simvastatin daily  . Prostate cancer (Cameron Chandler) 12/13/12   ,Radiation  . Seizures (Cameron Chandler)    hx of-had one over 20 yrs ago  . Tingling    hands  . Unspecified essential hypertension    takes Diovan and Metoprolol daily  . Weakness    in legs     Current Outpatient Prescriptions  Medication Sig Dispense Refill  . aspirin EC 81 MG tablet Take 81 mg by mouth daily.    Cameron Chandler dexamethasone (DECADRON) 4 MG tablet 4 mg by mouth twice a day the day before, day of and day after the chemotherapy every 3 weeks 40 tablet 1  . folic acid (FOLVITE) 1 MG tablet  Take 1 tablet (1 mg total) by mouth daily. 30 tablet 4  . furosemide (LASIX) 40 MG tablet Take 1 tablet (40 mg total) by mouth daily. 14 tablet 1  . gabapentin (NEURONTIN) 100 MG capsule Take 1 capsule (100 mg total) by mouth 3 (three) times daily. 90 capsule 0  . hydrochlorothiazide (HYDRODIURIL) 25 MG tablet Take 25 mg by mouth daily.    Cameron Chandler HYDROcodone-acetaminophen (NORCO) 7.5-325 MG tablet Take 1 tablet by mouth 3 (three) times daily. 30 tablet 0  . insulin detemir (LEVEMIR) 100 unit/ml SOLN Inject 30 Units into the skin at bedtime.    Cameron Chandler levothyroxine (SYNTHROID, LEVOTHROID) 25 MCG tablet Take 1 tablet (25 mcg total) by mouth daily before breakfast. 30 tablet 11  . lidocaine-prilocaine (EMLA) cream Apply 1 application topically as needed. 30 g 0  . meloxicam (MOBIC) 7.5 MG tablet Take 7.5 mg by mouth daily.    . metFORMIN (GLUCOPHAGE) 500 MG tablet Take 500 mg by mouth 2 (two) times daily with a meal.    . metoprolol tartrate (LOPRESSOR) 25 MG tablet Take 12.5 mg by mouth 2 (two) times daily.    Cameron Chandler  nitroGLYCERIN (NITROSTAT) 0.4 MG SL tablet Place 0.4 mg under the tongue every 5 (five) minutes as needed for chest pain.    . potassium chloride (K-DUR,KLOR-CON) 10 MEQ tablet Take 1 tablet (10 mEq total) by mouth daily. 14 tablet 1  . prochlorperazine (COMPAZINE) 10 MG tablet Take 1 tablet (10 mg total) by mouth every 6 (six) hours as needed for nausea or vomiting. 30 tablet 0  . simvastatin (ZOCOR) 80 MG tablet Take 80 mg by mouth at bedtime.    . simvastatin (ZOCOR) 80 MG tablet TAKE 1 TABLET BY MOUTH DAILY AT 6 P.M. 30 tablet 3  . warfarin (COUMADIN) 5 MG tablet Take 5 mg by mouth daily. Or as directed Will stop prior to procedure  1  . Wound Dressings (SONAFINE) Apply 1 application topically daily.     No current facility-administered medications for this visit.     Physical Exam BP (!) 121/94   Pulse (!) 132   Resp 16   Ht '5\' 6"'$  (1.676 m)   Wt 211 lb (95.7 kg)   SpO2 99% Comment: ON  RA  BMI 34.85 kg/m  60 year old man in no acute distress Alert and oriented 3 with no focal deficits Lungs with diminished breath sounds at left base, right side clear Cardiac regular rate and rhythm 4+ peripheral edema  Diagnostic Tests: CHEST  2 VIEW  COMPARISON:  January 08, 2016  FINDINGS: There is a right-sided chest tube. The right-sided pleural effusion is much smaller in the interval. A moderate left-sided effusion is stable. The heart, hila, and mediastinum are unchanged. No pneumothorax. The left Port-A-Cath has been placed terminating in the mid SVC. Mild increased interstitial markings centrally suggest pulmonary venous congestion/ mild edema. No other interval changes.  IMPRESSION: 1. The patient has a left-sided Port-A-Cath and a right-sided chest tube. The right-sided pleural effusion is much smaller in the interval. The moderate left effusion is stable. 2. Interval development of mild edema.   Electronically Signed   By: Dorise Bullion III M.D   On: 01/28/2016 11:26  I personally reviewed the chest x-ray and concur with the findings noted above.  Impression: 60 year old man with a malignant right pleural effusion that has required multiple thoracenteses he now is a pleural drainage catheter in place. He is draining every other day. Most recently put out 300 ml. We will continue with every other day drainage until he is less than 200 for 2 consecutive times. Then we will change to once weekly drainage.  His chest x-ray does show a relatively stable left effusion. That does not need intervention at this point, but that could change with time.  Primary problem is severe peripheral edema. He has weeping from both legs. He has been scheduled for an appointment with the wound clinic to check the wounds on his left leg. In the meantime we will place Unna boots today to see if we can help control the swelling to some degree.  Plan: Continue every other day  drainage until less than 200 mL on 2 consecutive occasions, then go to once weekly.  Return in 3 weeks with chest x-ray.  Melrose Nakayama, MD [facility] [phone]

## 2016-01-30 ENCOUNTER — Emergency Department (HOSPITAL_COMMUNITY): Payer: Medicare HMO

## 2016-01-30 ENCOUNTER — Encounter (HOSPITAL_COMMUNITY): Payer: Self-pay | Admitting: Emergency Medicine

## 2016-01-30 ENCOUNTER — Inpatient Hospital Stay (HOSPITAL_COMMUNITY)
Admission: EM | Admit: 2016-01-30 | Discharge: 2016-02-07 | DRG: 180 | Disposition: A | Discharge status: Home Health | Payer: Medicare HMO | Attending: Internal Medicine | Admitting: Internal Medicine

## 2016-01-30 DIAGNOSIS — Z9889 Other specified postprocedural states: Secondary | ICD-10-CM

## 2016-01-30 DIAGNOSIS — Z7189 Other specified counseling: Secondary | ICD-10-CM

## 2016-01-30 DIAGNOSIS — J90 Pleural effusion, not elsewhere classified: Secondary | ICD-10-CM | POA: Diagnosis present

## 2016-01-30 DIAGNOSIS — Z6830 Body mass index (BMI) 30.0-30.9, adult: Secondary | ICD-10-CM

## 2016-01-30 DIAGNOSIS — Z923 Personal history of irradiation: Secondary | ICD-10-CM

## 2016-01-30 DIAGNOSIS — L8990 Pressure ulcer of unspecified site, unspecified stage: Secondary | ICD-10-CM | POA: Diagnosis present

## 2016-01-30 DIAGNOSIS — C3491 Malignant neoplasm of unspecified part of right bronchus or lung: Principal | ICD-10-CM | POA: Diagnosis present

## 2016-01-30 DIAGNOSIS — R0902 Hypoxemia: Secondary | ICD-10-CM | POA: Diagnosis present

## 2016-01-30 DIAGNOSIS — D689 Coagulation defect, unspecified: Secondary | ICD-10-CM

## 2016-01-30 DIAGNOSIS — D696 Thrombocytopenia, unspecified: Secondary | ICD-10-CM | POA: Diagnosis present

## 2016-01-30 DIAGNOSIS — Z85830 Personal history of malignant neoplasm of bone: Secondary | ICD-10-CM

## 2016-01-30 DIAGNOSIS — R0602 Shortness of breath: Secondary | ICD-10-CM | POA: Diagnosis not present

## 2016-01-30 DIAGNOSIS — E785 Hyperlipidemia, unspecified: Secondary | ICD-10-CM | POA: Diagnosis present

## 2016-01-30 DIAGNOSIS — E871 Hypo-osmolality and hyponatremia: Secondary | ICD-10-CM | POA: Diagnosis present

## 2016-01-30 DIAGNOSIS — Z791 Long term (current) use of non-steroidal anti-inflammatories (NSAID): Secondary | ICD-10-CM

## 2016-01-30 DIAGNOSIS — J939 Pneumothorax, unspecified: Secondary | ICD-10-CM | POA: Diagnosis not present

## 2016-01-30 DIAGNOSIS — E119 Type 2 diabetes mellitus without complications: Secondary | ICD-10-CM

## 2016-01-30 DIAGNOSIS — Z22322 Carrier or suspected carrier of Methicillin resistant Staphylococcus aureus: Secondary | ICD-10-CM

## 2016-01-30 DIAGNOSIS — I5033 Acute on chronic diastolic (congestive) heart failure: Secondary | ICD-10-CM | POA: Diagnosis present

## 2016-01-30 DIAGNOSIS — R791 Abnormal coagulation profile: Secondary | ICD-10-CM | POA: Diagnosis present

## 2016-01-30 DIAGNOSIS — Z955 Presence of coronary angioplasty implant and graft: Secondary | ICD-10-CM

## 2016-01-30 DIAGNOSIS — Z7901 Long term (current) use of anticoagulants: Secondary | ICD-10-CM

## 2016-01-30 DIAGNOSIS — Z981 Arthrodesis status: Secondary | ICD-10-CM

## 2016-01-30 DIAGNOSIS — R079 Chest pain, unspecified: Secondary | ICD-10-CM

## 2016-01-30 DIAGNOSIS — M6281 Muscle weakness (generalized): Secondary | ICD-10-CM

## 2016-01-30 DIAGNOSIS — I110 Hypertensive heart disease with heart failure: Secondary | ICD-10-CM | POA: Diagnosis present

## 2016-01-30 DIAGNOSIS — J189 Pneumonia, unspecified organism: Secondary | ICD-10-CM | POA: Diagnosis present

## 2016-01-30 DIAGNOSIS — I252 Old myocardial infarction: Secondary | ICD-10-CM

## 2016-01-30 DIAGNOSIS — F17210 Nicotine dependence, cigarettes, uncomplicated: Secondary | ICD-10-CM | POA: Diagnosis present

## 2016-01-30 DIAGNOSIS — Z9221 Personal history of antineoplastic chemotherapy: Secondary | ICD-10-CM

## 2016-01-30 DIAGNOSIS — A4902 Methicillin resistant Staphylococcus aureus infection, unspecified site: Secondary | ICD-10-CM | POA: Diagnosis present

## 2016-01-30 DIAGNOSIS — J910 Malignant pleural effusion: Secondary | ICD-10-CM | POA: Diagnosis present

## 2016-01-30 DIAGNOSIS — L89322 Pressure ulcer of left buttock, stage 2: Secondary | ICD-10-CM | POA: Diagnosis not present

## 2016-01-30 DIAGNOSIS — Z79899 Other long term (current) drug therapy: Secondary | ICD-10-CM

## 2016-01-30 DIAGNOSIS — Z5111 Encounter for antineoplastic chemotherapy: Secondary | ICD-10-CM

## 2016-01-30 DIAGNOSIS — Y95 Nosocomial condition: Secondary | ICD-10-CM | POA: Diagnosis present

## 2016-01-30 DIAGNOSIS — Z8546 Personal history of malignant neoplasm of prostate: Secondary | ICD-10-CM

## 2016-01-30 DIAGNOSIS — J9601 Acute respiratory failure with hypoxia: Secondary | ICD-10-CM | POA: Diagnosis present

## 2016-01-30 DIAGNOSIS — Z794 Long term (current) use of insulin: Secondary | ICD-10-CM

## 2016-01-30 DIAGNOSIS — E876 Hypokalemia: Secondary | ICD-10-CM | POA: Diagnosis present

## 2016-01-30 DIAGNOSIS — E878 Other disorders of electrolyte and fluid balance, not elsewhere classified: Secondary | ICD-10-CM | POA: Diagnosis present

## 2016-01-30 DIAGNOSIS — I82629 Acute embolism and thrombosis of deep veins of unspecified upper extremity: Secondary | ICD-10-CM | POA: Diagnosis present

## 2016-01-30 DIAGNOSIS — I2510 Atherosclerotic heart disease of native coronary artery without angina pectoris: Secondary | ICD-10-CM | POA: Diagnosis present

## 2016-01-30 DIAGNOSIS — Z85038 Personal history of other malignant neoplasm of large intestine: Secondary | ICD-10-CM

## 2016-01-30 DIAGNOSIS — E039 Hypothyroidism, unspecified: Secondary | ICD-10-CM | POA: Diagnosis present

## 2016-01-30 DIAGNOSIS — R1310 Dysphagia, unspecified: Secondary | ICD-10-CM

## 2016-01-30 DIAGNOSIS — I10 Essential (primary) hypertension: Secondary | ICD-10-CM | POA: Diagnosis present

## 2016-01-30 DIAGNOSIS — L89312 Pressure ulcer of right buttock, stage 2: Secondary | ICD-10-CM | POA: Diagnosis not present

## 2016-01-30 DIAGNOSIS — B9562 Methicillin resistant Staphylococcus aureus infection as the cause of diseases classified elsewhere: Secondary | ICD-10-CM | POA: Diagnosis present

## 2016-01-30 DIAGNOSIS — E43 Unspecified severe protein-calorie malnutrition: Secondary | ICD-10-CM | POA: Diagnosis present

## 2016-01-30 DIAGNOSIS — Z515 Encounter for palliative care: Secondary | ICD-10-CM

## 2016-01-30 LAB — BASIC METABOLIC PANEL
Anion gap: 9 (ref 5–15)
BUN: 23 mg/dL — AB (ref 6–20)
CALCIUM: 7.6 mg/dL — AB (ref 8.9–10.3)
CHLORIDE: 96 mmol/L — AB (ref 101–111)
CO2: 28 mmol/L (ref 22–32)
CREATININE: 0.83 mg/dL (ref 0.61–1.24)
GFR calc non Af Amer: >60 mL/min (ref >60)
GLUCOSE: 75 mg/dL (ref 65–99)
Potassium: 3.3 mmol/L — ABNORMAL LOW (ref 3.5–5.1)
Sodium: 133 mmol/L — ABNORMAL LOW (ref 135–145)

## 2016-01-30 LAB — I-STAT TROPONIN, ED: TROPONIN I, POC: 0.01 ng/mL (ref 0.00–0.08)

## 2016-01-30 LAB — CBC
HCT: 38.7 % — ABNORMAL LOW (ref 39.0–52.0)
Hemoglobin: 13.2 g/dL (ref 13.0–17.0)
MCH: 26.1 pg (ref 26.0–34.0)
MCHC: 34.1 g/dL (ref 30.0–36.0)
MCV: 76.6 fL — AB (ref 78.0–100.0)
PLATELETS: 111 10*3/uL — AB (ref 150–400)
RBC: 5.05 MIL/uL (ref 4.22–5.81)
RDW: 18 % — AB (ref 11.5–15.5)
WBC: 5.2 10*3/uL (ref 4.0–10.5)

## 2016-01-30 LAB — PROTIME-INR
INR: 6.92 — AB
PROTHROMBIN TIME: 62.1 s — AB (ref 11.4–15.2)

## 2016-01-30 LAB — APTT: APTT: 131 s — AB (ref 24–36)

## 2016-01-30 MED ORDER — SODIUM CHLORIDE 0.9 % IV BOLUS (SEPSIS)
500.0000 mL | Freq: Once | INTRAVENOUS | Status: AC
  Administered 2016-01-31: 500 mL via INTRAVENOUS

## 2016-01-30 MED ORDER — IOPAMIDOL (ISOVUE-370) INJECTION 76%
100.0000 mL | Freq: Once | INTRAVENOUS | Status: AC | PRN
  Administered 2016-01-30: 100 mL via INTRAVENOUS

## 2016-01-30 MED ORDER — SODIUM CHLORIDE 0.9 % IV BOLUS (SEPSIS)
500.0000 mL | Freq: Once | INTRAVENOUS | Status: AC
  Administered 2016-01-30: 500 mL via INTRAVENOUS

## 2016-01-30 NOTE — ED Triage Notes (Signed)
Per EMS pt complains of chest pain on the left and right side for 2 days with SOB HX of CHF and Lung Cancer.

## 2016-01-30 NOTE — ED Notes (Signed)
CRITICAL VALUE ALERT  Critical value received:  INR 6.92  Date of notification:  01/30/2016  Time of notification:  2352  Critical value read back:Yes.    Nurse who received alert:  Charlies Silvers, RN  MD notified (1st page):  M[place]  Time of first page:  2354
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
## 2016-01-31 ENCOUNTER — Encounter (HOSPITAL_COMMUNITY): Payer: Self-pay | Admitting: Family Medicine

## 2016-01-31 DIAGNOSIS — E785 Hyperlipidemia, unspecified: Secondary | ICD-10-CM | POA: Diagnosis present

## 2016-01-31 DIAGNOSIS — E876 Hypokalemia: Secondary | ICD-10-CM | POA: Diagnosis present

## 2016-01-31 DIAGNOSIS — E039 Hypothyroidism, unspecified: Secondary | ICD-10-CM | POA: Diagnosis present

## 2016-01-31 DIAGNOSIS — A4902 Methicillin resistant Staphylococcus aureus infection, unspecified site: Secondary | ICD-10-CM | POA: Diagnosis not present

## 2016-01-31 DIAGNOSIS — L89322 Pressure ulcer of left buttock, stage 2: Secondary | ICD-10-CM | POA: Diagnosis not present

## 2016-01-31 DIAGNOSIS — E871 Hypo-osmolality and hyponatremia: Secondary | ICD-10-CM | POA: Diagnosis present

## 2016-01-31 DIAGNOSIS — I110 Hypertensive heart disease with heart failure: Secondary | ICD-10-CM | POA: Diagnosis present

## 2016-01-31 DIAGNOSIS — E43 Unspecified severe protein-calorie malnutrition: Secondary | ICD-10-CM | POA: Diagnosis present

## 2016-01-31 DIAGNOSIS — J90 Pleural effusion, not elsewhere classified: Secondary | ICD-10-CM | POA: Diagnosis not present

## 2016-01-31 DIAGNOSIS — J189 Pneumonia, unspecified organism: Secondary | ICD-10-CM | POA: Diagnosis present

## 2016-01-31 DIAGNOSIS — Z85038 Personal history of other malignant neoplasm of large intestine: Secondary | ICD-10-CM | POA: Diagnosis not present

## 2016-01-31 DIAGNOSIS — Z8546 Personal history of malignant neoplasm of prostate: Secondary | ICD-10-CM | POA: Diagnosis not present

## 2016-01-31 DIAGNOSIS — Z7189 Other specified counseling: Secondary | ICD-10-CM | POA: Diagnosis not present

## 2016-01-31 DIAGNOSIS — D689 Coagulation defect, unspecified: Secondary | ICD-10-CM | POA: Diagnosis not present

## 2016-01-31 DIAGNOSIS — R1310 Dysphagia, unspecified: Secondary | ICD-10-CM

## 2016-01-31 DIAGNOSIS — L89312 Pressure ulcer of right buttock, stage 2: Secondary | ICD-10-CM | POA: Diagnosis not present

## 2016-01-31 DIAGNOSIS — E119 Type 2 diabetes mellitus without complications: Secondary | ICD-10-CM | POA: Diagnosis present

## 2016-01-31 DIAGNOSIS — Z515 Encounter for palliative care: Secondary | ICD-10-CM | POA: Diagnosis not present

## 2016-01-31 DIAGNOSIS — R791 Abnormal coagulation profile: Secondary | ICD-10-CM | POA: Diagnosis present

## 2016-01-31 DIAGNOSIS — I5033 Acute on chronic diastolic (congestive) heart failure: Secondary | ICD-10-CM | POA: Diagnosis present

## 2016-01-31 DIAGNOSIS — Z794 Long term (current) use of insulin: Secondary | ICD-10-CM | POA: Diagnosis not present

## 2016-01-31 DIAGNOSIS — J939 Pneumothorax, unspecified: Secondary | ICD-10-CM | POA: Diagnosis not present

## 2016-01-31 DIAGNOSIS — D696 Thrombocytopenia, unspecified: Secondary | ICD-10-CM | POA: Diagnosis present

## 2016-01-31 DIAGNOSIS — Y95 Nosocomial condition: Secondary | ICD-10-CM | POA: Diagnosis present

## 2016-01-31 DIAGNOSIS — Z955 Presence of coronary angioplasty implant and graft: Secondary | ICD-10-CM | POA: Diagnosis not present

## 2016-01-31 DIAGNOSIS — J9601 Acute respiratory failure with hypoxia: Secondary | ICD-10-CM | POA: Diagnosis present

## 2016-01-31 DIAGNOSIS — I82622 Acute embolism and thrombosis of deep veins of left upper extremity: Secondary | ICD-10-CM | POA: Diagnosis not present

## 2016-01-31 DIAGNOSIS — Z85830 Personal history of malignant neoplasm of bone: Secondary | ICD-10-CM | POA: Diagnosis not present

## 2016-01-31 DIAGNOSIS — J910 Malignant pleural effusion: Secondary | ICD-10-CM | POA: Diagnosis present

## 2016-01-31 DIAGNOSIS — I5021 Acute systolic (congestive) heart failure: Secondary | ICD-10-CM | POA: Diagnosis not present

## 2016-01-31 DIAGNOSIS — R0902 Hypoxemia: Secondary | ICD-10-CM | POA: Diagnosis present

## 2016-01-31 DIAGNOSIS — Z5111 Encounter for antineoplastic chemotherapy: Secondary | ICD-10-CM

## 2016-01-31 DIAGNOSIS — R0602 Shortness of breath: Secondary | ICD-10-CM | POA: Diagnosis present

## 2016-01-31 DIAGNOSIS — C3491 Malignant neoplasm of unspecified part of right bronchus or lung: Secondary | ICD-10-CM | POA: Diagnosis present

## 2016-01-31 DIAGNOSIS — Z9221 Personal history of antineoplastic chemotherapy: Secondary | ICD-10-CM | POA: Diagnosis not present

## 2016-01-31 DIAGNOSIS — Z7901 Long term (current) use of anticoagulants: Secondary | ICD-10-CM | POA: Diagnosis not present

## 2016-01-31 DIAGNOSIS — D72829 Elevated white blood cell count, unspecified: Secondary | ICD-10-CM | POA: Diagnosis not present

## 2016-01-31 LAB — HEPATIC FUNCTION PANEL
ALK PHOS: 72 U/L (ref 38–126)
ALT: 44 U/L (ref 17–63)
AST: 61 U/L — ABNORMAL HIGH (ref 15–41)
Albumin: 1.5 g/dL — ABNORMAL LOW (ref 3.5–5.0)
BILIRUBIN DIRECT: 0.1 mg/dL (ref 0.1–0.5)
BILIRUBIN INDIRECT: 0.1 mg/dL — AB (ref 0.3–0.9)
BILIRUBIN TOTAL: 0.2 mg/dL — AB (ref 0.3–1.2)
Total Protein: 6 g/dL — ABNORMAL LOW (ref 6.5–8.1)

## 2016-01-31 LAB — GLUCOSE, CAPILLARY
GLUCOSE-CAPILLARY: 49 mg/dL — AB (ref 65–99)
GLUCOSE-CAPILLARY: 101 mg/dL — AB (ref 65–99)
GLUCOSE-CAPILLARY: 48 mg/dL — AB (ref 65–99)
GLUCOSE-CAPILLARY: 116 mg/dL — AB (ref 65–99)
Glucose-Capillary: 79 mg/dL (ref 65–99)
Glucose-Capillary: 92 mg/dL (ref 65–99)

## 2016-01-31 LAB — LACTIC ACID, PLASMA
LACTIC ACID, VENOUS: 2.4 mmol/L — AB (ref 0.5–1.9)
LACTIC ACID, VENOUS: 2.3 mmol/L — AB (ref 0.5–1.9)
LACTIC ACID, VENOUS: 0.9 mmol/L (ref 0.5–1.9)
Lactic Acid, Venous: 1.7 mmol/L (ref 0.5–1.9)

## 2016-01-31 LAB — TROPONIN I
TROPONIN I: 0.03 ng/mL — AB (ref <0.03)
TROPONIN I: 0.04 ng/mL — AB (ref <0.03)
Troponin I: 0.03 ng/mL (ref <0.03)

## 2016-01-31 LAB — PROTIME-INR
INR: 5.5 — AB
Prothrombin Time: 51.6 seconds — ABNORMAL HIGH (ref 11.4–15.2)

## 2016-01-31 LAB — BRAIN NATRIURETIC PEPTIDE: B Natriuretic Peptide: 174 pg/mL — ABNORMAL HIGH (ref 0.0–100.0)

## 2016-01-31 LAB — MAGNESIUM: MAGNESIUM: 1.6 mg/dL — AB (ref 1.7–2.4)

## 2016-01-31 MED ORDER — LEVOTHYROXINE SODIUM 25 MCG PO TABS
25.0000 ug | ORAL_TABLET | Freq: Every day | ORAL | Status: DC
  Administered 2016-01-31 – 2016-02-07 (×8): 25 ug via ORAL
  Filled 2016-01-31 (×9): qty 1

## 2016-01-31 MED ORDER — FUROSEMIDE 10 MG/ML IJ SOLN
40.0000 mg | Freq: Two times a day (BID) | INTRAMUSCULAR | Status: DC
  Administered 2016-01-31 – 2016-02-07 (×15): 40 mg via INTRAVENOUS
  Filled 2016-01-31 (×15): qty 4

## 2016-01-31 MED ORDER — ASPIRIN EC 81 MG PO TBEC
81.0000 mg | DELAYED_RELEASE_TABLET | Freq: Every day | ORAL | Status: DC
  Administered 2016-01-31 – 2016-02-07 (×8): 81 mg via ORAL
  Filled 2016-01-31 (×8): qty 1

## 2016-01-31 MED ORDER — SODIUM CHLORIDE 0.9% FLUSH
3.0000 mL | Freq: Two times a day (BID) | INTRAVENOUS | Status: DC
  Administered 2016-01-31 – 2016-02-06 (×14): 3 mL via INTRAVENOUS

## 2016-01-31 MED ORDER — ATORVASTATIN CALCIUM 40 MG PO TABS
40.0000 mg | ORAL_TABLET | Freq: Every day | ORAL | Status: DC
  Administered 2016-01-31 – 2016-02-07 (×8): 40 mg via ORAL
  Filled 2016-01-31 (×8): qty 1

## 2016-01-31 MED ORDER — ALBUMIN HUMAN 25 % IV SOLN
25.0000 g | Freq: Once | INTRAVENOUS | Status: AC
  Administered 2016-01-31: 25 g via INTRAVENOUS
  Filled 2016-01-31: qty 100

## 2016-01-31 MED ORDER — HYDROMORPHONE HCL 1 MG/ML IJ SOLN
0.5000 mg | INTRAMUSCULAR | Status: DC | PRN
  Administered 2016-01-31 – 2016-02-01 (×3): 0.5 mg via INTRAVENOUS
  Administered 2016-02-01 – 2016-02-03 (×4): 1 mg via INTRAVENOUS
  Administered 2016-02-03: 0.5 mg via INTRAVENOUS
  Administered 2016-02-03 – 2016-02-04 (×3): 1 mg via INTRAVENOUS
  Administered 2016-02-04: 0.5 mg via INTRAVENOUS
  Administered 2016-02-04 – 2016-02-05 (×8): 1 mg via INTRAVENOUS
  Administered 2016-02-06: 0.5 mg via INTRAVENOUS
  Administered 2016-02-06: 1 mg via INTRAVENOUS
  Administered 2016-02-06 (×2): 0.5 mg via INTRAVENOUS
  Administered 2016-02-06 (×2): 1 mg via INTRAVENOUS
  Administered 2016-02-07: 0.5 mg via INTRAVENOUS
  Administered 2016-02-07: 1 mg via INTRAVENOUS
  Administered 2016-02-07 (×2): 0.5 mg via INTRAVENOUS
  Administered 2016-02-07: 1 mg via INTRAVENOUS
  Filled 2016-01-31: qty 0.5
  Filled 2016-01-31 (×2): qty 1
  Filled 2016-01-31 (×3): qty 0.5
  Filled 2016-01-31 (×2): qty 1
  Filled 2016-01-31: qty 0.5
  Filled 2016-01-31 (×4): qty 1
  Filled 2016-01-31: qty 0.5
  Filled 2016-01-31: qty 1
  Filled 2016-01-31: qty 0.5
  Filled 2016-01-31 (×9): qty 1
  Filled 2016-01-31 (×2): qty 0.5
  Filled 2016-01-31: qty 1
  Filled 2016-01-31: qty 0.5
  Filled 2016-01-31: qty 1
  Filled 2016-01-31: qty 0.5

## 2016-01-31 MED ORDER — ONDANSETRON HCL 4 MG/2ML IJ SOLN: 4.0000 mg | Freq: Four times a day (QID) | INTRAMUSCULAR | Status: DC | PRN

## 2016-01-31 MED ORDER — GABAPENTIN 100 MG PO CAPS
100.0000 mg | ORAL_CAPSULE | Freq: Three times a day (TID) | ORAL | Status: DC
  Administered 2016-01-31 – 2016-02-07 (×23): 100 mg via ORAL
  Filled 2016-01-31 (×23): qty 1

## 2016-01-31 MED ORDER — INSULIN ASPART 100 UNIT/ML ~~LOC~~ SOLN
0.0000 [IU] | Freq: Three times a day (TID) | SUBCUTANEOUS | Status: DC
  Administered 2016-02-01 – 2016-02-06 (×9): 1 [IU] via SUBCUTANEOUS
  Administered 2016-02-06: 2 [IU] via SUBCUTANEOUS
  Administered 2016-02-07 (×2): 1 [IU] via SUBCUTANEOUS

## 2016-01-31 MED ORDER — MELOXICAM 7.5 MG PO TABS
7.5000 mg | ORAL_TABLET | Freq: Every day | ORAL | Status: DC
  Administered 2016-01-31 – 2016-02-07 (×8): 7.5 mg via ORAL
  Filled 2016-01-31 (×11): qty 1

## 2016-01-31 MED ORDER — SODIUM CHLORIDE 0.9% FLUSH: 3.0000 mL | INTRAVENOUS | Status: DC | PRN

## 2016-01-31 MED ORDER — SODIUM CHLORIDE 0.9 % IV SOLN: 250.0000 mL | INTRAVENOUS | Status: DC | PRN

## 2016-01-31 MED ORDER — FOLIC ACID 1 MG PO TABS
1.0000 mg | ORAL_TABLET | Freq: Every day | ORAL | Status: DC
  Administered 2016-01-31 – 2016-02-07 (×8): 1 mg via ORAL
  Filled 2016-01-31 (×8): qty 1

## 2016-01-31 MED ORDER — METOPROLOL TARTRATE 25 MG PO TABS
12.5000 mg | ORAL_TABLET | Freq: Two times a day (BID) | ORAL | Status: DC
  Administered 2016-01-31 – 2016-02-07 (×16): 12.5 mg via ORAL
  Filled 2016-01-31 (×16): qty 1

## 2016-01-31 MED ORDER — WARFARIN - PHARMACIST DOSING INPATIENT: Status: DC

## 2016-01-31 MED ORDER — POTASSIUM CHLORIDE CRYS ER 10 MEQ PO TBCR
10.0000 meq | EXTENDED_RELEASE_TABLET | Freq: Every day | ORAL | Status: DC
  Administered 2016-01-31 – 2016-02-07 (×8): 10 meq via ORAL
  Filled 2016-01-31 (×8): qty 1

## 2016-01-31 MED ORDER — ACETAMINOPHEN 325 MG PO TABS: 650.0000 mg | ORAL_TABLET | ORAL | Status: DC | PRN

## 2016-01-31 MED ORDER — POTASSIUM CHLORIDE 10 MEQ/100ML IV SOLN
10.0000 meq | INTRAVENOUS | Status: AC
  Administered 2016-01-31 (×2): 10 meq via INTRAVENOUS
  Filled 2016-01-31 (×2): qty 100

## 2016-01-31 MED ORDER — ALBUMIN HUMAN 25 % IV SOLN
INTRAVENOUS | Status: AC
  Filled 2016-01-31: qty 100

## 2016-01-31 MED ORDER — DEXTROSE 50 % IV SOLN
INTRAVENOUS | Status: AC
  Administered 2016-01-31: 50 mL
  Filled 2016-01-31: qty 50

## 2016-01-31 NOTE — Progress Notes (Signed)
MD made aware of patients latest INR of 5.5.  Via text page

## 2016-01-31 NOTE — Progress Notes (Signed)
Initial Nutrition Assessment  DOCUMENTATION CODES:   Severe malnutrition in context of chronic illness (stage IV lung cancer)   INTERVENTION:  ST evaluation is pending   Once pt is cleared for diet will initiate oral supplement  NUTRITION DIAGNOSIS:   Malnutrition related to cancer and cancer related treatments, dysphagia as evidenced by energy intake < or equal to 75% for > or equal to 1 month, severe depletion of muscle mass.  GOAL:   Patient will meet greater than or equal to 90% of their needs   MONITOR:   Diet advancement, PO intake, Supplement acceptance, Labs, Weight trends, Skin  REASON FOR ASSESSMENT:   Malnutrition Screening Tool    ASSESSMENT: Cameron Chandler is a 60 yo male who has Stage 4 lung adenocarcinoma, CHF, DM tye 2, HTN, CAD. He had a pleurx drain placed 10/12 and says the drainage amount is slowing down. A couple of days ago he developed increased shortness of breath and chest pain. His appetite has been declining and it  has become more difficult for him to swallow for at least the past month. Cameron Boeckman says he is coughing after foods and liquids. He has been limiting his intake because of this. His weight is down but not significant for timeframe. His edema likely is masking his true weight. Nutrition-Focused physical exam completed. Findings are mild fat depletion (orbital, thoraic), moderate-severe muscle depletion (temporal, clavicle acromion bone region), and left arm edema.     Recent Labs Lab 01/30/16 2241 01/31/16 0312  NA 133*  --   K 3.3*  --   CL 96*  --   CO2 28  --   BUN 23*  --   CREATININE 0.83  --   CALCIUM 7.6*  --   MG  --  1.6*  GLUCOSE 75  --    Labs: reviewed  Meds/ vitamins: folic acid, , potassium, insulin  Diet Order:  Diet regular Room service appropriate? Yes; Fluid consistency: Thin  Skin:  Chronic wound to left ankle   Last BM:  11/3 distended abdomen  Height:   Ht Readings from Last 1 Encounters:  01/30/16 5'  6" (1.676 m)    Weight:   Wt Readings from Last 1 Encounters:  01/31/16 187 lb 8 oz (85 kg)    Ideal Body Weight:  65 kg  BMI:  Body mass index is 30.26 kg/m.  Estimated Nutritional Needs:   Kcal:  2125-2380  Protein:  95-105 gr  Fluid:  per MD goals  EDUCATION NEEDS:   No education needs identified at this time  Colman Cater MS,RD,CSG,LDN Office: #[phone] Pager: [phone]

## 2016-01-31 NOTE — Progress Notes (Signed)
Patient seen and examined, database reviewed. No family members at bedside. Admitted overnight due to progressive SOB. Has a h/o stage IV lung cancer with recurrent malignant pleural effusion s/p pleurex catheter on the right, also diastolic CHF. CXR shows moderate bilateral pleural effusions. CT chest without PE and small right PTX. ECHO pending. Continue diuresis. If symptoms persist despite diuresis may need thoracentesis, but patient states his SOB is much improved today. I have asked Dr. Arnoldo Morale to review CT to make sure nothing needs to be done about the small PTX. Will continue to follow.  Domingo Mend, MD [facility] Pager: [phone]

## 2016-01-31 NOTE — ED Notes (Signed)
[facility].  [name] [phone] [name] [phone]

## 2016-01-31 NOTE — Progress Notes (Signed)
°  Radiation Oncology         [phone]) [phone] ________________________________  Name: Cameron Chandler MRN: [id number]  Date: 01/24/2016  DOB: 11/25/1955  End of Treatment Note  Diagnosis:   Stage IV (T2b, N3, M1 B) non-small cell right upper lung cancer, adenocarcinoma, with metastatic bone disease to the T1 and T2  Indication for treatment:  Palliative       Radiation treatment dates:   01/13/16 - 01/24/16  Site/dose:  30 Gy in 10 fractions to the thoracic spine (T1/T2)  Beams/energy:   3D // 15X Photon  Narrative: The patient tolerated radiation treatment relatively well. The patient's main complaint was throbbing pain in the left leg as a 10/10 (wound not related to his treatment). The patient also had back pain. He ran out of pain medication towards the end of treatment, hydrocodone 7.5/'325mg'$  and requested a refill. I refilled his hydrocodone with 30 tablets. Swollen left hand and pitting edema in the left leg. Denied nausea. He also reported a non-productive cough.  Plan: The patient has completed radiation treatment. The patient will return to radiation oncology clinic for routine followup in one month. I advised them to call or return sooner if they have any questions or concerns related to their recovery or treatment.  ------------------------------------------------  Jodelle Gross, MD, PhD  This document serves as a record of services personally performed by Kyung Rudd, MD. It was created on his behalf by Darcus Austin, a trained medical scribe. The creation of this record is based on the scribe's personal observations and the provider's statements to them. This document has been checked and approved by the attending provider.
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
## 2016-01-31 NOTE — Progress Notes (Addendum)
CRITICAL VALUE ALERT  Critical value received:  Lactic Acid 2.3  Date of notification:  01/31/2016  Time of notification:  0420  Critical value read back:yes  Nurse who received alert:  Marinell Blight  MD notified (1st page):  [phone]  Time of first page:    MD notified (2nd page):  Time of second page:  Responding MD:  Dr. Myna Hidalgo  Time MD responded: [id number]

## 2016-01-31 NOTE — ED Notes (Signed)
CRITICAL VALUE ALERT  Critical value received:  Lactic Acid  Date of notification:  01/31/2016  Time of notification:  0021  Critical value read back:Yes.    Nurse who received alert:  Charlies Silvers RN  MD notified (1st page):  Phoebe Sharps, [place]  Time of first page:  0022

## 2016-01-31 NOTE — Consult Note (Addendum)
[place] Nurse wound consult note Reason for Consult: Consult requested for bilat legs.  EMR indicates these are skin tears, bedside nurse assessed the appearance of the wounds and discussed the plan of care via phone call with the bedside nurse and feels they may be stasis ulcers. Refer to nursing flow-sheet for measurements. Wound type: Bilat legs with several patchy areas of partial thickness skin loss; bedside nurse states appearance is consistent with previous blisters which have ruptured and peeled, loose skin surrounding open areas.  Mod amt yellow drainage, all wounds are shallow. Legs with generalized edema. Dressing procedure/placement/frequency:  Foam dressings to absorb drainage and protect from further injury.  Ace wrap for light compression. Dressing change instructions provided for the bedside nurses. Please re-consult if further assistance is needed.  Thank-you,  Julien Girt MSN, [place], [place], [place], [place]

## 2016-01-31 NOTE — Progress Notes (Signed)
ANTICOAGULATION CONSULT NOTE - Initial Consult  Pharmacy Consult for Warfarin Indication: VTE Treatment  Allergies  Allergen Reactions  . No Known Allergies     Patient Measurements: Height: '5\' 6"'$  (167.6 cm) Weight: 187 lb 8 oz (85 kg) IBW/kg (Calculated) : 63.8 Heparin Dosing Weight:   Vital Signs: Temp: 98.3 F (36.8 C) (11/03 1405) Temp Source: Oral (11/03 1405) BP: 87/56 (11/03 1405) Pulse Rate: 99 (11/03 1405)  Labs:  Recent Labs  01/30/16 2241 01/31/16 0312 01/31/16 0627 01/31/16 0803  HGB 13.2  --   --   --   HCT 38.7*  --   --   --   PLT 111*  --   --   --   APTT 131*  --   --   --   LABPROT 62.1*  --  51.6*  --   INR 6.92*  --  5.50*  --   CREATININE 0.83  --   --   --   TROPONINI  --  0.03*  --  0.03*    Estimated Creatinine Clearance: 94.4 mL/min (by C-G formula based on SCr of 0.83 mg/dL).   Medical History: Past Medical History:  Diagnosis Date  . Adenocarcinoma of right lung, stage 4 ([place]) 12/03/2015  . Arthritis    back, joint pain  . Bone cancer ([place])    T 1T2  . Cancer [facility]) 2011   colon cancer  . Colon cancer [facility])    Colon resection and Chemotherapy  . Coronary atherosclerosis of native coronary artery   . Diabetes mellitus without complication ([place])    takes Meformin and Levemir daily  . DVT (deep venous thrombosis) ([facility])    Left arm  . Dyspnea    lying,sitting,exertion  . Heart attack   . Hypothyroidism    takes Synthroid daily  . Myocardial infarction 2007  . Nocturia   . Other and unspecified hyperlipidemia    takes Simvastatin daily  . Prostate cancer ([place]) 12/13/12   ,Radiation  . Seizures ([place])    hx of-had one over 20 yrs ago  . Tingling    hands  . Unspecified essential hypertension    takes Diovan and Metoprolol daily  . Weakness    in legs    Medications:  Prescriptions Prior to Admission  Medication Sig Dispense Refill Last Dose  . aspirin EC 81 MG tablet Take 81 mg by mouth daily.   01/28/2016 at Unknown  time  . dexamethasone (DECADRON) 4 MG tablet 4 mg by mouth twice a day the day before, day of and day after the chemotherapy every 3 weeks (Patient taking differently: Take 4 mg by mouth 2 (two) times daily. 4 mg by mouth twice a day the day before, day of and day after the chemotherapy every 3 weeks) 40 tablet 1 01/13/2016 at Unknown time  . Fenofibric Acid 105 MG TABS Take 1 tablet by mouth daily.   09/32/3557  . folic acid (FOLVITE) 1 MG tablet Take 1 tablet (1 mg total) by mouth daily. 30 tablet 4 01/28/2016 at Unknown time  . furosemide (LASIX) 40 MG tablet Take 1 tablet (40 mg total) by mouth daily. 14 tablet 1 01/28/2016 at Unknown time  . gabapentin (NEURONTIN) 100 MG capsule Take 1 capsule (100 mg total) by mouth 3 (three) times daily. (Patient taking differently: Take 300 mg by mouth 3 (three) times daily. ) 90 capsule 0 01/28/2016 at Unknown time  . hydrochlorothiazide (HYDRODIURIL) 25 MG tablet Take 25 mg by mouth  daily.   01/28/2016 at Unknown time  . HYDROcodone-acetaminophen (NORCO) 7.5-325 MG tablet Take 1 tablet by mouth 3 (three) times daily. 30 tablet 0 01/30/2016 at Unknown time  . levothyroxine (SYNTHROID, LEVOTHROID) 25 MCG tablet Take 1 tablet (25 mcg total) by mouth daily before breakfast. 30 tablet 11 01/28/2016 at Unknown time  . lidocaine-prilocaine (EMLA) cream Apply 1 application topically as needed. (Patient taking differently: Apply 1 application topically as needed (for port access). ) 30 g 0 unknown  . meloxicam (MOBIC) 7.5 MG tablet Take 7.5 mg by mouth daily.   01/28/2016 at Unknown time  . metFORMIN (GLUMETZA) 500 MG (MOD) 24 hr tablet Take 1,000 mg by mouth 2 (two) times daily with a meal.   01/28/2016  . metoprolol tartrate (LOPRESSOR) 25 MG tablet Take 12.5 mg by mouth 2 (two) times daily.   01/28/2016 at Unknown time  . potassium chloride (K-DUR,KLOR-CON) 10 MEQ tablet Take 1 tablet (10 mEq total) by mouth daily. 14 tablet 1 01/28/2016 at Unknown time  .  prochlorperazine (COMPAZINE) 10 MG tablet Take 1 tablet (10 mg total) by mouth every 6 (six) hours as needed for nausea or vomiting. 30 tablet 0 unknown  . simvastatin (ZOCOR) 80 MG tablet TAKE 1 TABLET BY MOUTH DAILY AT 6 P.M. 30 tablet 3 01/28/2016 at Unknown time  . valsartan (DIOVAN) 160 MG tablet Take 160 mg by mouth daily.   01/28/2016  . warfarin (COUMADIN) 5 MG tablet Take 5 mg by mouth daily.   1 01/28/2016 at Unknown time  . Wound Dressings (SONAFINE) Apply 1 application topically daily.   01/28/2016 at Unknown time  . insulin detemir (LEVEMIR) 100 unit/ml SOLN Inject 30 Units into the skin at bedtime.   over 30 days  . nitroGLYCERIN (NITROSTAT) 0.4 MG SL tablet Place 0.4 mg under the tongue every 5 (five) minutes as needed for chest pain.   unknown    Assessment: 60 yo male taking warfarin PTA for DVT in left extremity. INR elevated on admission, remains elevated today  Goal of Therapy:  INR 2-3 Monitor platelets by anticoagulation protocol: Yes   Plan:  No warfarin today due to elevated INR INR/PT daily Monitor CBC, platelets, signs of bleeding  Cameron Chandler, Cameron Chandler 01/31/2016,2:14 PM

## 2016-01-31 NOTE — Evaluation (Signed)
Physical Therapy Evaluation Patient Details Name: Cameron Chandler MRN: [id number] DOB: 1955-08-26 Today's Date: 01/31/2016   History of Present Illness  60 y.o. male with history significant for stage IV adenocarcinoma of the lung, chronic diastolic CHF, insulin-dependent diabetes mellitus, hypertension, coronary artery disease, and left upper extremity DVT who presents to the emergency department for evaluation of progressive dyspnea for the past 2 days. Patient has a Pleurx catheter in the right chest for a malignant effusion and there has not been much drainage as of late. Patient reports being in his usual state until approximately 2 days ago when he noted the insidious development of dyspnea with minimal exertion, which has progressed to the point where he is now dyspneic at rest. There has been some pain across the anterior chest and both lateral chest walls, worse with deep inspiration. Patient has been taking Coumadin for a DVT in the left upper extremity and reports strict adherence with this. He also complains of bilateral lower extremity edema which he describes as worsening. He has no really noticed that his breathing is worse while lying flat or not. He denies any recent fevers or chills. He endorses a cough productive of thick white sputum that has been chronic. He denies any headache, change in vision or hearing, or focal numbness or weakness. Patient does note difficulty swallowing and reports choking on both foods and liquids.  INR is supratherapeutic at 6.92.  Dx:   acute hypoxic respiratory failure suspected to be multifactorial, bilateral pleural effusions, and acute on chronic CHF.     Clinical Impression  Pt received in bed, and was agreeable to PT evaluation.  Dtr arrived during PT tx.  Pt expressed that he mostly mobilizes in the w/c at home, but requires assistance to perform SPT to get into the w/c.  Pt also requires assistance for dressing and bathing.    PT evaluation  performed with limited mobility for the following reasons: Supratherapeutic INR at 6.92 (>4= increased risk of hemarthrosis so bed to chair transfers only), BG of 49 and pt is NPO, and pt's limited prior level of function.    Pt required Min A for supine<>sit, and Min A for SPT from bed<>chair while pivoting mostly on the R LE.  Pt is recommended for HHPT upon d/c.     Follow Up Recommendations Home health PT    Equipment Recommendations  None recommended by PT    Recommendations for Other Services       Precautions / Restrictions Precautions Precautions: Fall Precaution Comments: INR is currently supratherapeutic at 6.92, therefore per [facility] guidlines: >4= increased risk of hemarthrosis so bed to chair transfers only due to gross unsteadiness. Restrictions Weight Bearing Restrictions: No      Mobility  Bed Mobility Overal bed mobility: Needs Assistance Bed Mobility: Supine to Sit (increased time, and assistance to progress L LE off the EOB. )     Supine to sit: HOB elevated;Min assist        Transfers Overall transfer level: Needs assistance Equipment used: None Transfers: Stand Pivot Transfers   Stand pivot transfers: Min assist (Pt performs a 1/2 stand with majority of his weight on the R LE, and pivots buttocks over into the chair.  )       General transfer comment: Further mobility is limited due to supratherapeutic INR at 6.92, as well as BG at 49 - RN aware, and going to re-check at end of tx.  Pt currently NPO.   Ambulation/Gait  Stairs            Wheelchair Mobility    Modified Rankin (Stroke Patients Only)       Balance Overall balance assessment: Needs assistance Sitting-balance support: Bilateral upper extremity supported;Feet supported Sitting balance-Leahy Scale: Fair     Standing balance support: Bilateral upper extremity supported Standing balance-Leahy Scale: Poor Standing balance comment: Pt does not come up into  full standing position for transfer.                              Pertinent Vitals/Pain Pain Assessment: 0-10 Pain Score: 9  Pain Location: B LE's.  Pain Descriptors / Indicators: Sharp Pain Intervention(s): Limited activity within patient's tolerance;Monitored during session;Repositioned    Home Living   Living Arrangements: Children (dtr and son in law) Available Help at Discharge: Available 24 hours/day Type of Home: House Home Access: Stairs to enter   [employer] of Steps: 1-2  Home Layout: One level Home Equipment: Walker - 2 wheels;Cane - single point;Wheelchair - Regulatory affairs officer / Transfers Assistance Needed: Pt states he required assistance from his son in law to transfer into the w/c, and then he would mobilize in the w/c.  Pt reports using a combination of his UE's and LE's to propel the w/c, and occasionally requiring assistance depending on distance and terrain.   ADL's / Homemaking Assistance Needed: Pt reports being able to dress independently on occasion, assistance for bathing from his dtr.          Hand Dominance   Dominant Hand: Right    Extremity/Trunk Assessment   Upper Extremity Assessment: Generalized weakness           Lower Extremity Assessment: Generalized weakness         Communication   Communication:  (very soft voice. )  Cognition Arousal/Alertness: Awake/alert Behavior During Therapy: WFL for tasks assessed/performed Overall Cognitive Status: Within Functional Limits for tasks assessed                      General Comments General comments (skin integrity, edema, etc.): B LE's with wounds L LE covered with mepilex dressing    Exercises     Assessment/Plan    PT Assessment Patient needs continued PT services  PT Problem List Decreased strength;Decreased activity tolerance;Decreased balance;Decreased mobility;Decreased knowledge of use of DME;Decreased safety  awareness;Decreased knowledge of precautions;Cardiopulmonary status limiting activity;Decreased skin integrity;Obesity          PT Treatment Interventions DME instruction;Functional mobility training;Therapeutic activities;Therapeutic exercise;Balance training;Patient/family education;Wheelchair mobility training    PT Goals (Current goals can be found in the Care Plan section)  Acute Rehab PT Goals Patient Stated Goal: Pt wants to go home.  PT Goal Formulation: With patient/family Time For Goal Achievement: 02/07/16 Potential to Achieve Goals: Fair    Frequency Min 3X/week   Barriers to discharge        Co-evaluation               End of Session Equipment Utilized During Treatment: Gait belt Activity Tolerance: Patient limited by fatigue;Patient tolerated treatment well Patient left: in chair;with call bell/phone within reach;with family/visitor present (Meagan, RN heading to his room. ) Nurse Communication: Mobility status (Meagan, RN notified of pt's mobilty status, and location. )    Functional Assessment Tool Used: [employer] "6-clicks"  Functional Limitation: Mobility: Walking and  moving around Mobility: Walking and Moving Around Current Status [phone]): At least 60 percent but less than 80 percent impaired, limited or restricted Mobility: Walking and Moving Around Goal Status [phone]): At least 40 percent but less than 60 percent impaired, limited or restricted    Time: 1139-1200 PT Time Calculation (min) (ACUTE ONLY): 21 min   Charges:   PT Evaluation $PT Eval Moderate Complexity: 1 Procedure     PT G Codes:   PT G-Codes **NOT FOR INPATIENT CLASS** Functional Assessment Tool Used: [employer] "6-clicks"  Functional Limitation: Mobility: Walking and moving around Mobility: Walking and Moving Around Current Status [phone]): At least 60 percent but less than 80 percent impaired, limited or restricted Mobility: Walking and Moving Around  Goal Status [phone]): At least 40 percent but less than 60 percent impaired, limited or restricted    Beth Antoniette Peake, PT, DPT X: [phone]

## 2016-01-31 NOTE — Care Management Important Message (Signed)
Important Message  Patient Details  Name: Cameron Chandler MRN: [id number] Date of Birth: 1955/09/30   Medicare Important Message Given:  Yes    Sherald Barge, RN 01/31/2016, 11:34 AM

## 2016-01-31 NOTE — Consult Note (Signed)
Consultation Note Date: 01/31/2016   Patient Name: Cameron Chandler  DOB: 05-Dec-1955  MRN: [id number]  Age / Sex: 60 y.o., male  PCP: [name], MD Referring Physician: Koleen Nimrod Acost*  Reason for Consultation: Establishing goals of care  HPI/Patient Profile: 60 y.o. male  with past medical history of stage IV adenocarcinoma of the lung, chronic diastolic CHF, insulin-dependent diabetes mellitus, hypertension, coronary artery disease, and left upper extremity DVT who presents to the emergency department for evaluation of progressive dyspnea for the past 2 days. Patient has a Pleurx catheter in the right chest for a malignant effusion and there has not been much drainage as of late admitted on 01/30/2016 with Acute hypoxic respiratory failure.   Clinical Assessment and Goals of Care: Cameron Chandler is resting quietly in his [place] chair. There is no family at bedside at this time. He greets me making and keeping eye contact. He denies pain at this time, but endorses that he is occasionally short of breath due to his fluid buildup in the abdomen. He states that he is managing his Pleurex well at home with the assistance of his daughters.  We talk about healthcare power of attorney (see below). We also talk about advanced directives. Mr. Cameron Chandler states that his oncologist at [facility] has no further cancer treatment options to provide. We talk about the realities of CPR and chest compressions along with intubation and ventilation. I share my worry that if he were to be so sick to need this intervention, it would not change his cancer or his functional status. I encourage him to think about choices related to life support. He states "I never thought it would come to this". We talk about help at home. I share the benefits of hospice in home. This seems to be the 1st discussion he has had regarding hospice. He  states that his oncologist says he has 12 to 15 months.  Healthcare power of attorney NEXT OF KIN - Mr. Cameron Chandler has no legal healthcare power of attorney. He states his wishes for his daughter Cameron Chandler to be his health care proxy.   SUMMARY OF RECOMMENDATIONS   continue to treat the treatable, no further chemotherapy options.  Code Status/Advance Care Planning:  Full code - we discuss code status including CPR and intubation. Mr. Cameron Chandler states, "I never thought it would come to that". He states he is not discussed his wishes with his daughter Cameron Chandler. I encourage him to think about his choices. I share my worries about CPR and intubation, these interventions will not change his cancer diagnosis.  Symptom Management:   per hospitalist  Palliative Prophylaxis:   Aspiration, Frequent Pain Assessment and Turn Reposition  Additional Recommendations (Limitations, Scope, Preferences):  Continue to treat the treatable at this point.  Psycho-social/Spiritual:   Desire for further Chaplaincy support:no  Additional Recommendations: Caregiving  Support/Resources and Education on Hospice  Prognosis:   < 6 months, would not be surprising based on functional decline, stage IV cancer with no further treatment  plant.  Discharge Planning: To be determined, likely return to home with home health services. Benefits of in-home hospice discussed.      Primary Diagnoses: Present on Admission: . Hypoxia . Acute on chronic diastolic CHF (congestive heart failure) ([place]) . Acute respiratory failure with hypoxia ([place]) . Adenocarcinoma of right lung, stage 4 ([place]) . CAD, NATIVE VESSEL . DVT of upper extremity (deep vein thrombosis) ([place]) . Essential hypertension . Hypothyroidism, acquired . Pleural effusion, bilateral . Hyponatremia . Hypokalemia . Supratherapeutic INR . Thrombocytopenia ([place])   I have reviewed the medical record, interviewed the patient and family, and examined the patient. The  following aspects are pertinent.  Past Medical History:  Diagnosis Date  . Adenocarcinoma of right lung, stage 4 ([place]) 12/03/2015  . Arthritis    back, joint pain  . Bone cancer ([place])    T 1T2  . Cancer [facility]) 2011   colon cancer  . Colon cancer [facility])    Colon resection and Chemotherapy  . Coronary atherosclerosis of native coronary artery   . Diabetes mellitus without complication ([place])    takes Meformin and Levemir daily  . DVT (deep venous thrombosis) ([facility])    Left arm  . Dyspnea    lying,sitting,exertion  . Heart attack   . Hypothyroidism    takes Synthroid daily  . Myocardial infarction 2007  . Nocturia   . Other and unspecified hyperlipidemia    takes Simvastatin daily  . Prostate cancer ([place]) 12/13/12   ,Radiation  . Seizures ([place])    hx of-had one over 20 yrs ago  . Tingling    hands  . Unspecified essential hypertension    takes Diovan and Metoprolol daily  . Weakness    in legs   Social History   Social History  . Marital status: Legally Separated    Spouse name: N/A  . Number of children: N/A  . Years of education: N/A   Social History Main Topics  . Smoking status: Current Every Day Smoker    Packs/day: 0.25    Years: 48.00    Types: Cigarettes  . Smokeless tobacco: Never Used  . Alcohol use No  . Drug use: No  . Sexual activity: No   Other Topics Concern  . None   Social History Narrative  . None   Family History  Problem Relation Age of Onset  . Diabetes Mother   . Head & neck cancer Mother   . Diabetes Sister   . Cancer Sister     "mouth"  . Cancer Brother     colon  . Cancer Brother     colon, liver cancer   Scheduled Meds: . aspirin EC  81 mg Oral Daily  . atorvastatin  40 mg Oral q1800  . folic acid  1 mg Oral Daily  . furosemide  40 mg Intravenous Q12H  . gabapentin  100 mg Oral TID  . insulin aspart  0-9 Units Subcutaneous TID WC  . levothyroxine  25 mcg Oral QAC breakfast  . meloxicam  7.5 mg Oral Daily  . metoprolol  tartrate  12.5 mg Oral BID  . potassium chloride  10 mEq Oral Daily  . sodium chloride flush  3 mL Intravenous Q12H  . [START ON 02/01/2016] Warfarin - Pharmacist Dosing Inpatient   Does not apply Q24H   Continuous Infusions:  PRN Meds:.sodium chloride, acetaminophen, HYDROmorphone (DILAUDID) injection, ondansetron (ZOFRAN) IV, sodium chloride flush Medications Prior to Admission:  Prior to Admission medications  Medication Sig Start Date End Date Taking? Authorizing Provider  aspirin EC 81 MG tablet Take 81 mg by mouth daily.   Yes Historical Provider, MD  dexamethasone (DECADRON) 4 MG tablet 4 mg by mouth twice a day the day before, day of and day after the chemotherapy every 3 weeks Patient taking differently: Take 4 mg by mouth 2 (two) times daily. 4 mg by mouth twice a day the day before, day of and day after the chemotherapy every 3 weeks 01/01/16  Yes [name], MD  Fenofibric Acid 105 MG TABS Take 1 tablet by mouth daily.   Yes Historical Provider, MD  folic acid (FOLVITE) 1 MG tablet Take 1 tablet (1 mg total) by mouth daily. 01/01/16  Yes [name], MD  furosemide (LASIX) 40 MG tablet Take 1 tablet (40 mg total) by mouth daily. 12/31/15  Yes Melrose Nakayama, MD  gabapentin (NEURONTIN) 100 MG capsule Take 1 capsule (100 mg total) by mouth 3 (three) times daily. Patient taking differently: Take 300 mg by mouth 3 (three) times daily.  01/09/15  Yes Noemi Chapel, MD  hydrochlorothiazide (HYDRODIURIL) 25 MG tablet Take 25 mg by mouth daily.   Yes Historical Provider, MD  HYDROcodone-acetaminophen (NORCO) 7.5-325 MG tablet Take 1 tablet by mouth 3 (three) times daily. 01/24/16  Yes Kyung Rudd, MD  levothyroxine (SYNTHROID, LEVOTHROID) 25 MCG tablet Take 1 tablet (25 mcg total) by mouth daily before breakfast. 11/22/15  Yes Tanda Rockers, MD  lidocaine-prilocaine (EMLA) cream Apply 1 application topically as needed. Patient taking differently: Apply 1 application topically as  needed (for port access).  01/01/16  Yes [name], MD  meloxicam (MOBIC) 7.5 MG tablet Take 7.5 mg by mouth daily.   Yes Historical Provider, MD  metFORMIN (GLUMETZA) 500 MG (MOD) 24 hr tablet Take 1,000 mg by mouth 2 (two) times daily with a meal.   Yes Historical Provider, MD  metoprolol tartrate (LOPRESSOR) 25 MG tablet Take 12.5 mg by mouth 2 (two) times daily.   Yes Historical Provider, MD  potassium chloride (K-DUR,KLOR-CON) 10 MEQ tablet Take 1 tablet (10 mEq total) by mouth daily. 12/31/15  Yes Melrose Nakayama, MD  prochlorperazine (COMPAZINE) 10 MG tablet Take 1 tablet (10 mg total) by mouth every 6 (six) hours as needed for nausea or vomiting. 01/01/16  Yes [name], MD  simvastatin (ZOCOR) 80 MG tablet TAKE 1 TABLET BY MOUTH DAILY AT 6 P.M. 01/17/16  Yes Herminio Commons, MD  valsartan (DIOVAN) 160 MG tablet Take 160 mg by mouth daily.   Yes Historical Provider, MD  warfarin (COUMADIN) 5 MG tablet Take 5 mg by mouth daily.  12/13/15  Yes Historical Provider, MD  Wound Dressings (SONAFINE) Apply 1 application topically daily. 01/14/16  Yes Kyung Rudd, MD  insulin detemir (LEVEMIR) 100 unit/ml SOLN Inject 30 Units into the skin at bedtime.    Historical Provider, MD  nitroGLYCERIN (NITROSTAT) 0.4 MG SL tablet Place 0.4 mg under the tongue every 5 (five) minutes as needed for chest pain.    Historical Provider, MD   Allergies  Allergen Reactions  . No Known Allergies    Review of Systems  Unable to perform ROS: Acuity of condition    Physical Exam  Constitutional: He is oriented to person, place, and time. No distress.  Frail appearing, chronically ill appearing. Makes and keeps eye contact  HENT:  Head: Normocephalic and atraumatic.  Temporal wasting  Cardiovascular: Normal rate.   Pulmonary/Chest: Effort normal. No  respiratory distress.  Abdominal: Soft. He exhibits distension. There is no tenderness. There is no guarding.  Markedly distended abdomen,  nontender  Neurological: He is alert and oriented to person, place, and time.  Skin: Skin is warm and dry.  Nursing note and vitals reviewed.   Vital Signs: BP (!) 87/56 (BP Location: Right Arm)   Pulse 99   Temp 98.3 F (36.8 C) (Oral)   Resp 20   Ht '5\' 6"'$  (1.676 m)   Wt 85 kg (187 lb 8 oz)   SpO2 97%   BMI 30.26 kg/m  Pain Assessment: 0-10   Pain Score: Asleep   SpO2: SpO2: 97 % O2 Device:SpO2: 97 % O2 Flow Rate: .O2 Flow Rate (L/min): 4 L/min  IO: Intake/output summary:  Intake/Output Summary (Last 24 hours) at 01/31/16 1501 Last data filed at 01/31/16 0700  Gross per 24 hour  Intake              100 ml  Output                0 ml  Net              100 ml    LBM: Last BM Date: 01/31/16 Baseline Weight: Weight: 85.7 kg (189 lb) Most recent weight: Weight: 85 kg (187 lb 8 oz)     Palliative Assessment/Data:   Flowsheet Rows   Flowsheet Row Most Recent Value  Intake Tab  Referral Department  Hospitalist  Unit at Time of Referral  Oncology Unit  Palliative Care Primary Diagnosis  Cancer  Date Notified  01/31/16  Palliative Care Type  New Palliative care  Reason for referral  Clarify Goals of Care  Date of Admission  01/30/16  Date first seen by Palliative Care  01/31/16  # of days Palliative referral response time  0 Day(s)  # of days IP prior to Palliative referral  1  Clinical Assessment  Palliative Performance Scale Score  40%  Pain Max last 24 hours  Not able to report  Pain Min Last 24 hours  Not able to report  Dyspnea Max Last 24 Hours  Not able to report  Dyspnea Min Last 24 hours  Not able to report  Psychosocial & Spiritual Assessment  Palliative Care Outcomes  Patient/Family meeting held?  Yes  Who was at the meeting?  Patient to only  Palliative Care Outcomes  Provided psychosocial or spiritual support, Provided advance care planning, Counseled regarding hospice  Palliative Care follow-up planned  -- [Follow-up all at [facility]]      Time In:  8677 Time Out: 1445 Time Total: 30 minutes Greater than 50%  of this time was spent counseling and coordinating care related to the above assessment and plan.  Signed by: Drue Novel, NP   Please contact Palliative Medicine Team phone at [phone] for questions and concerns.  For individual provider: See Shea Evans

## 2016-01-31 NOTE — Progress Notes (Signed)
Hypoglycemic Event  CBG: 49  Treatment: D50 IV 50 mL  Symptoms: Shaky and Hungry  Follow-up CBG: Time:1300 CBG Result:101  Possible Reasons for Event: Inadequate meal intake  Comments/MD notified:resolved with IV D50, MD made aware via text page.    Cameron Chandler [place]

## 2016-01-31 NOTE — Progress Notes (Signed)
CRITICAL VALUE ALERT  Critical value received:  Troponin 0.03  Date of notification:  01/31/2016  Time of notification:  4496  Critical value read back:yes  Nurse who received alert:  Manuela Schwartz  MD notified (1st page):  Dr. Myna Hidalgo  Time of first page:  [phone]  MD notified (2nd page):  Time of second page:  Responding MD:  Dr. Myna Hidalgo  Time MD responded:  [phone]

## 2016-01-31 NOTE — Care Management Note (Signed)
Case Management Note  Patient Details  Name: Cameron Chandler MRN: [id number] Date of Birth: [birth year]  Subjective/Objective:                  Pt is from home, lives with his daughter and requires assistance with ADL's. Pt is active with [facility] for nursing services. Salli Real, of [facility] is aware of admission and will be updated on plan at DC. Pt plans to DC home with resumption of [facility] services. Pt will need order to resume [place] services. Pt uses walker with ambulation. He does not wear oxygen at home. He has pluerex catheters in place and bottles at home. He drains every other day approx 3 L. Pt plans to return home with self care.   Action/Plan: Will cont to follow. Pt will need home O2 assessments prior to DC.   Expected Discharge Date:    02/04/2016              Expected Discharge Plan:  [place]  In-House Referral:  NA  Discharge planning Services  CM Consult  Post Acute Care Choice:  Home Health, Resumption of Svcs/PTA Provider Choice offered to:  Patient  HH Arranged:  RN [place] Agency:  [place]  Status of Service:  In process, will continue to follow Sherald Barge, RN 01/31/2016, 11:36 AM

## 2016-02-01 ENCOUNTER — Inpatient Hospital Stay (HOSPITAL_COMMUNITY): Payer: Medicare HMO

## 2016-02-01 DIAGNOSIS — I5021 Acute systolic (congestive) heart failure: Secondary | ICD-10-CM

## 2016-02-01 LAB — BASIC METABOLIC PANEL
Anion gap: 9 (ref 5–15)
BUN: 22 mg/dL — AB (ref 6–20)
CALCIUM: 7.7 mg/dL — AB (ref 8.9–10.3)
CO2: 27 mmol/L (ref 22–32)
CREATININE: 0.69 mg/dL (ref 0.61–1.24)
Chloride: 100 mmol/L — ABNORMAL LOW (ref 101–111)
GFR calc Af Amer: >60 mL/min (ref >60)
GLUCOSE: 102 mg/dL — AB (ref 65–99)
POTASSIUM: 3.2 mmol/L — AB (ref 3.5–5.1)
SODIUM: 136 mmol/L (ref 135–145)

## 2016-02-01 LAB — ECHOCARDIOGRAM COMPLETE
HEIGHTINCHES: 66 in
Weight: 3062.4 oz

## 2016-02-01 LAB — CBC
HCT: 33.4 % — ABNORMAL LOW (ref 39.0–52.0)
Hemoglobin: 11 g/dL — ABNORMAL LOW (ref 13.0–17.0)
MCH: 25.6 pg — AB (ref 26.0–34.0)
MCHC: 32.9 g/dL (ref 30.0–36.0)
MCV: 77.7 fL — AB (ref 78.0–100.0)
PLATELETS: 148 10*3/uL — AB (ref 150–400)
RBC: 4.3 MIL/uL (ref 4.22–5.81)
RDW: 18.1 % — AB (ref 11.5–15.5)
WBC: 8.5 10*3/uL (ref 4.0–10.5)

## 2016-02-01 LAB — GLUCOSE, CAPILLARY
GLUCOSE-CAPILLARY: 105 mg/dL — AB (ref 65–99)
GLUCOSE-CAPILLARY: 133 mg/dL — AB (ref 65–99)
GLUCOSE-CAPILLARY: 167 mg/dL — AB (ref 65–99)
Glucose-Capillary: 129 mg/dL — ABNORMAL HIGH (ref 65–99)

## 2016-02-01 LAB — PROTIME-INR
INR: 4.45
PROTHROMBIN TIME: 43.6 s — AB (ref 11.4–15.2)

## 2016-02-01 MED ORDER — FLUCONAZOLE 100 MG PO TABS
100.0000 mg | ORAL_TABLET | Freq: Every day | ORAL | Status: AC
  Administered 2016-02-01 – 2016-02-07 (×7): 100 mg via ORAL
  Filled 2016-02-01 (×7): qty 1

## 2016-02-01 NOTE — Progress Notes (Signed)
PROGRESS NOTE    Cameron Chandler  IOX:[id number] DOB: 31-Jan-1956 DOA: 01/30/2016 PCP: [name], MD     Brief Narrative:  60 y/o man admitted from home on 11/3 with SOB. Has h/o adenocarcinoma of the lung s/p pleurex placement on the right for recurrent malignant pleural effusions.   Assessment & Plan:   Principal Problem:   Acute on chronic diastolic CHF (congestive heart failure) (HCC) Active Problems:   Essential hypertension   CAD, NATIVE VESSEL   Diabetes mellitus without complication (H[facility])   Pleural effusion, bilateral   Hypothyroidism, acquired   Adenocarcinoma of right lung, stage 4 (HCC)   Acute respiratory failure with hypoxia (HCC)   DVT of upper extremity (deep vein thrombosis) (HCC)   Hypoxia   Hyponatremia   Hypokalemia   Supratherapeutic INR   Thrombocytopenia (HCC)   Dysphagia   Protein-calorie malnutrition, severe   Goals of care, counseling/discussion   Palliative care encounter   DNR (do not resuscitate) discussion   Dyspnea -Likely multifactorial: recurrent bilateral pleural effusions, acute diastolic CHF and lung cancer. -CT chest was negative for PE, had small PTX. Discussed with Dr. Arnoldo Morale, no need for chest tube placement. -Will drain right pleurex today. -See no need for left thoracentesis as he is already remarkably improved symptomatically. -ECHO pending. -Continue lasix 40 mg IV BID, doubt accuracy of Is and Os.  LUE DVT -Continue coumadin (on hold since INR remains supratherapeutic).  Hypokalemia -Replete orally, check Mg level.  Lung Cancer -Per patient, he is considering hospice. -Appreciate palliative care input. -Will f/u with oncologist as scheduled as an OP.   DVT prophylaxis: Coumadin Code Status: full code Family Communication: daughter at bedside Disposition Plan: home when ready  Consultants:   Palliative care  Procedures:   none  Antimicrobials:   none    Subjective: Feels improved, was able to  sit in chair yesterday for a long time.  Objective: Vitals:   01/31/16 1405 01/31/16 1720 01/31/16 2048 02/01/16 0553  BP: (!) 87/56 104/67 (!) 101/58 108/60  Pulse: 99 (!) 104 99 89  Resp: '20  20 20  '$ Temp: 98.3 F (36.8 C)  98.2 F (36.8 C) 98.4 F (36.9 C)  TempSrc: Oral  Other (Comment) Oral  SpO2: 97%  96% 95%  Weight:    86.8 kg (191 lb 6.4 oz)  Height:        Intake/Output Summary (Last 24 hours) at 02/01/16 1155 Last data filed at 02/01/16 0554  Gross per 24 hour  Intake                3 ml  Output              250 ml  Net             -247 ml   Filed Weights   01/30/16 2212 01/31/16 0220 02/01/16 0553  Weight: 85.7 kg (189 lb) 85 kg (187 lb 8 oz) 86.8 kg (191 lb 6.4 oz)    Examination:  General exam: Alert, awake, oriented x 3 Respiratory system: Decreased BS bilateral bases. Cardiovascular system:RRR. No murmurs, rubs, gallops. Gastrointestinal system: Abdomen is nondistended, soft and nontender. No organomegaly or masses felt. Normal bowel sounds heard. Central nervous system: Alert and oriented. No focal neurological deficits. Extremities: No C/C/E, +pedal pulses Skin: No rashes, lesions or ulcers Psychiatry: Judgement and insight appear normal. Mood & affect appropriate.     Data Reviewed: I have personally reviewed following labs and imaging studies  CBC:  Recent Labs Lab 01/30/16 2241 02/01/16 0518  WBC 5.2 8.5  HGB 13.2 11.0*  HCT 38.7* 33.4*  MCV 76.6* 77.7*  PLT 111* 081*   Basic Metabolic Panel:  Recent Labs Lab 01/30/16 2241 01/31/16 0312 02/01/16 0518  NA 133*  --  136  K 3.3*  --  3.2*  CL 96*  --  100*  CO2 28  --  27  GLUCOSE 75  --  102*  BUN 23*  --  22*  CREATININE 0.83  --  0.69  CALCIUM 7.6*  --  7.7*  MG  --  1.6*  --    GFR: Estimated Creatinine Clearance: 98.9 mL/min (by C-G formula based on SCr of 0.69 mg/dL). Liver Function Tests:  Recent Labs Lab 01/30/16 2241  AST 61*  ALT 44  ALKPHOS 72  BILITOT  0.2*  PROT 6.0*  ALBUMIN 1.5*   No results for input(s): LIPASE, AMYLASE in the last 168 hours. No results for input(s): AMMONIA in the last 168 hours. Coagulation Profile:  Recent Labs Lab 01/30/16 2241 01/31/16 0627 02/01/16 0518  INR 6.92* 5.50* 4.45*   Cardiac Enzymes:  Recent Labs Lab 01/31/16 0312 01/31/16 0803 01/31/16 1440  TROPONINI 0.03* 0.03* 0.04*   BNP (last 3 results)  Recent Labs  11/21/15 1641  PROBNP 25.0   HbA1C: No results for input(s): HGBA1C in the last 72 hours. CBG:  Recent Labs Lab 01/31/16 1314 01/31/16 1627 01/31/16 2047 02/01/16 0749 02/01/16 1127  GLUCAP 101* 116* 92 105* 129*   Lipid Profile: No results for input(s): CHOL, HDL, LDLCALC, TRIG, CHOLHDL, LDLDIRECT in the last 72 hours. Thyroid Function Tests: No results for input(s): TSH, T4TOTAL, FREET4, T3FREE, THYROIDAB in the last 72 hours. Anemia Panel: No results for input(s): VITAMINB12, FOLATE, FERRITIN, TIBC, IRON, RETICCTPCT in the last 72 hours. Urine analysis:    Component Value Date/Time   COLORURINE YELLOW 01/26/2015 [place] 01/26/2015 0047   LABSPEC 1.018 01/26/2015 0047   PHURINE 5.5 01/26/2015 [place] 01/26/2015 0047   HGBUR NEGATIVE 01/26/2015 0047   BILIRUBINUR NEGATIVE 01/26/2015 0047   [place] 01/26/2015 0047   PROTEINUR NEGATIVE 01/26/2015 0047   UROBILINOGEN 0.2 01/26/2015 0047   NITRITE NEGATIVE 01/26/2015 0047   LEUKOCYTESUR NEGATIVE 01/26/2015 0047   Sepsis Labs: '@LABRCNTIP'$ (procalcitonin:4,lacticidven:4)  )No results found for this or any previous visit (from the past 240 hour(s)).       Radiology Studies: Dg Chest 2 View  Result Date: 01/30/2016 CLINICAL DATA:  Initial evaluation for acute left-sided chest pain, shortness of breath. EXAM: CHEST  2 VIEW COMPARISON:  Prior radiograph from 01/28/2016. FINDINGS: Left-sided Port-A-Cath in stable position. Cardiac and mediastinal silhouettes are  unchanged, and remain within normal limits. Moderate bilateral pleural effusions. Associated bibasilar opacities likely atelectasis, although infiltrates are not excluded. Diffuse pulmonary vascular congestion with prominence of the interstitial markings, suggesting mild superimposed edema. No pneumothorax. No acute osseous abnormality. IMPRESSION: 1. Moderate bilateral pleural effusions with associated bibasilar opacities. Atelectasis/edema is favored, although infiltrates could be considered in the correct clinical setting. 2. Diffuse vascular congestion with interstitial prominence, suggesting superimposed mild edema. Electronically Signed   By: Jeannine Boga M.D.   On: 01/30/2016 23:06   Ct Angio Chest Pe W/cm &/or Wo Cm  Result Date: 01/31/2016 CLINICAL DATA:  Bilateral chest pain for 2 days with dyspnea. EXAM: CT ANGIOGRAPHY CHEST WITH CONTRAST TECHNIQUE: Multidetector CT imaging of the chest was performed using the standard protocol  during bolus administration of intravenous contrast. Multiplanar CT image reconstructions and MIPs were obtained to evaluate the vascular anatomy. CONTRAST:  100 mL Isovue 370 intravenous COMPARISON:  12/22/2015 FINDINGS: Cardiovascular: Satisfactory opacification of the pulmonary arteries to the segmental level. No evidence of pulmonary embolism. Normal heart size. No pericardial effusion. Normal heart size. Minimal pericardial effusion, unchanged. Mediastinum/Nodes: Unchanged hilar adenopathy bilaterally. The 14 mm short axis right hilar node is unchanged, visible on image 44 series 4. A 14 mm left hilar node is also unchanged, visible on image 42 series 4. Lungs/Pleura: Moderate left pleural effusion may be slightly larger than on 12/22/2015. There is significant size reduction of the right pleural effusion, with a small volume of partially loculated pleural fluid remaining despite presence of a right-sided pleural drain. There also is a very small right  pneumothorax. The right upper lobe lung mass measures 2.4 x 3.8 cm and previously measured 2.3 x 5.3 cm. The right upper lobe pulmonary nodule measures 8 mm and previously measured 10 mm. The 8 mm left upper lobe nodule is not visible today. Possible 2.6 cm central right lower lobe mass on image 62 series 6 was not previously visible due to marked lower lobe atelectasis from the surrounding pleural fluid. It is possible that this instead represents loculated fluid in the right major fissure; there is extensive thickening and fluid in the fissures of the right lung. Upper Abdomen: No conclusive hepatic or adrenal metastases. Small volume upper abdominal ascites. Musculoskeletal: Lytic/sclerotic metastases at T1 and T2 are again evident without significant interval change. No new skeletal lesions are evident. Review of the MIP images confirms the above findings. IMPRESSION: 1. Negative for pulmonary embolism. 2. Reduction in right pleural effusion with interim placement of a right pleural drain. There is a small residual right pleural collection which may be loculated. There also is a very small right pneumothorax which may relate to the pleural drain. 3. Decreased size of the right upper lobe mass. Possible central right lower lobe mass which was not previously visible but now that portion of the right lung is better expanded due to reduction of pleural fluid volume; it is possible that this instead represents a loculated collection of fluid in the major fissure. 4. Unchanged hilar adenopathy bilaterally. 5. Unchanged skeletal metastases. Electronically Signed   By: Andreas Newport M.D.   On: 01/31/2016 00:35        Scheduled Meds: . aspirin EC  81 mg Oral Daily  . atorvastatin  40 mg Oral q1800  . fluconazole  100 mg Oral Daily  . folic acid  1 mg Oral Daily  . furosemide  40 mg Intravenous Q12H  . gabapentin  100 mg Oral TID  . insulin aspart  0-9 Units Subcutaneous TID WC  . levothyroxine  25 mcg  Oral QAC breakfast  . meloxicam  7.5 mg Oral Daily  . metoprolol tartrate  12.5 mg Oral BID  . potassium chloride  10 mEq Oral Daily  . sodium chloride flush  3 mL Intravenous Q12H  . Warfarin - Pharmacist Dosing Inpatient   Does not apply Q24H   Continuous Infusions:    LOS: 1 day    Time spent: 25 minutes. Greater than 50% of this time was spent in direct contact with the patient coordinating care.     Lelon Frohlich, MD [facility] Pager [phone]  If 7PM-7AM, please contact night-coverage [email or web address] Password [email or web address] 02/01/2016, 11:55 AM

## 2016-02-01 NOTE — Progress Notes (Signed)
[place] for Warfarin Indication: VTE Treatment  Allergies  Allergen Reactions  . No Known Allergies    Patient Measurements: Height: '5\' 6"'$  (167.6 cm) Weight: 191 lb 6.4 oz (86.8 kg) IBW/kg (Calculated) : 63.8  Vital Signs: Temp: 98.4 F (36.9 C) (11/04 0553) Temp Source: Oral (11/04 0553) BP: 108/60 (11/04 0553) Pulse Rate: 89 (11/04 0553)  Labs:  Recent Labs  01/30/16 2241 01/31/16 0312 01/31/16 0627 01/31/16 0803 01/31/16 1440 02/01/16 0518  HGB 13.2  --   --   --   --  11.0*  HCT 38.7*  --   --   --   --  33.4*  PLT 111*  --   --   --   --  148*  APTT 131*  --   --   --   --   --   LABPROT 62.1*  --  51.6*  --   --  43.6*  INR 6.92*  --  5.50*  --   --  4.45*  CREATININE 0.83  --   --   --   --  0.69  TROPONINI  --  0.03*  --  0.03* 0.04*  --    Estimated Creatinine Clearance: 98.9 mL/min (by C-G formula based on SCr of 0.69 mg/dL).  Medical History: Past Medical History:  Diagnosis Date  . Adenocarcinoma of right lung, stage 4 ([place]) 12/03/2015  . Arthritis    back, joint pain  . Bone cancer ([place])    T 1T2  . Cancer [facility]) 2011   colon cancer  . Colon cancer [facility])    Colon resection and Chemotherapy  . Coronary atherosclerosis of native coronary artery   . Diabetes mellitus without complication ([place])    takes Meformin and Levemir daily  . DVT (deep venous thrombosis) (HCC)    Left arm  . Dyspnea    lying,sitting,exertion  . Heart attack   . Hypothyroidism    takes Synthroid daily  . Myocardial infarction 2007  . Nocturia   . Other and unspecified hyperlipidemia    takes Simvastatin daily  . Prostate cancer ([place]) 12/13/12   ,Radiation  . Seizures ([place])    hx of-had one over 20 yrs ago  . Tingling    hands  . Unspecified essential hypertension    takes Diovan and Metoprolol daily  . Weakness    in legs   Medications:  Prescriptions Prior to Admission  Medication Sig Dispense Refill Last Dose  .  aspirin EC 81 MG tablet Take 81 mg by mouth daily.   01/28/2016 at Unknown time  . dexamethasone (DECADRON) 4 MG tablet 4 mg by mouth twice Chandler day the day before, day of and day after the chemotherapy every 3 weeks (Patient taking differently: Take 4 mg by mouth 2 (two) times daily. 4 mg by mouth twice Chandler day the day before, day of and day after the chemotherapy every 3 weeks) 40 tablet 1 01/13/2016 at Unknown time  . Fenofibric Acid 105 MG TABS Take 1 tablet by mouth daily.   23/55/7322  . folic acid (FOLVITE) 1 MG tablet Take 1 tablet (1 mg total) by mouth daily. 30 tablet 4 01/28/2016 at Unknown time  . furosemide (LASIX) 40 MG tablet Take 1 tablet (40 mg total) by mouth daily. 14 tablet 1 01/28/2016 at Unknown time  . gabapentin (NEURONTIN) 100 MG capsule Take 1 capsule (100 mg total) by mouth 3 (three) times daily. (Patient taking differently: Take  300 mg by mouth 3 (three) times daily. ) 90 capsule 0 01/28/2016 at Unknown time  . hydrochlorothiazide (HYDRODIURIL) 25 MG tablet Take 25 mg by mouth daily.   01/28/2016 at Unknown time  . HYDROcodone-acetaminophen (NORCO) 7.5-325 MG tablet Take 1 tablet by mouth 3 (three) times daily. 30 tablet 0 01/30/2016 at Unknown time  . levothyroxine (SYNTHROID, LEVOTHROID) 25 MCG tablet Take 1 tablet (25 mcg total) by mouth daily before breakfast. 30 tablet 11 01/28/2016 at Unknown time  . lidocaine-prilocaine (EMLA) cream Apply 1 application topically as needed. (Patient taking differently: Apply 1 application topically as needed (for port access). ) 30 g 0 unknown  . meloxicam (MOBIC) 7.5 MG tablet Take 7.5 mg by mouth daily.   01/28/2016 at Unknown time  . metFORMIN (GLUMETZA) 500 MG (MOD) 24 hr tablet Take 1,000 mg by mouth 2 (two) times daily with Chandler meal.   01/28/2016  . metoprolol tartrate (LOPRESSOR) 25 MG tablet Take 12.5 mg by mouth 2 (two) times daily.   01/28/2016 at Unknown time  . potassium chloride (K-DUR,KLOR-CON) 10 MEQ tablet Take 1 tablet (10 mEq  total) by mouth daily. 14 tablet 1 01/28/2016 at Unknown time  . prochlorperazine (COMPAZINE) 10 MG tablet Take 1 tablet (10 mg total) by mouth every 6 (six) hours as needed for nausea or vomiting. 30 tablet 0 unknown  . simvastatin (ZOCOR) 80 MG tablet TAKE 1 TABLET BY MOUTH DAILY AT 6 P.M. 30 tablet 3 01/28/2016 at Unknown time  . valsartan (DIOVAN) 160 MG tablet Take 160 mg by mouth daily.   01/28/2016  . warfarin (COUMADIN) 5 MG tablet Take 5 mg by mouth daily.   1 01/28/2016 at Unknown time  . Wound Dressings (SONAFINE) Apply 1 application topically daily.   01/28/2016 at Unknown time  . insulin detemir (LEVEMIR) 100 unit/ml SOLN Inject 30 Units into the skin at bedtime.   over 30 days  . nitroGLYCERIN (NITROSTAT) 0.4 MG SL tablet Place 0.4 mg under the tongue every 5 (five) minutes as needed for chest pain.   unknown   Assessment: 60 yo male taking warfarin PTA for DVT in left extremity. INR elevated on admission, remains elevated today  Goal of Therapy:  INR 2-3 Monitor platelets by anticoagulation protocol: Yes   Plan:  No warfarin today due to elevated INR, allow INR to trend down INR/PT daily Monitor CBC, platelets, signs of bleeding  Cameron Chandler, Cameron Chandler 02/01/2016,8:39 AM

## 2016-02-01 NOTE — Progress Notes (Signed)
MD made aware via text page of patients elevated INR, although is improving.  3+ pitting edema noted this AM to L upper arm - worsened from yesterday, MD made aware

## 2016-02-01 NOTE — Progress Notes (Signed)
Patients O2 sats on RA dropped to 85%, placed back on 2L O2 , raised to 98%

## 2016-02-01 NOTE — Progress Notes (Signed)
Patients pleurX tube drained 200 cc yellow cloudy fluid.  No complications - patient tolerated well
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
## 2016-02-01 NOTE — Progress Notes (Signed)
Patient had episode of incontinence this afternoon.  Have continued to encourage use of urinal and call bell, each with in patients reach.

## 2016-02-01 NOTE — Progress Notes (Signed)
ECHO DONE 

## 2016-02-01 NOTE — Progress Notes (Signed)
During bath 2 small sores noted.  Loose skin peeled away while bathing.  Appears to be small stage 2 sores, 1 to each buttock on medial side.  Each approx 3x2 cm.  Patients states sore areas have been there approx a week.  Sacral foam placed after cleansing.  Patient able to turn self adequately.  Encouraged to do so every 1-2 hours to redistribute weight.

## 2016-02-02 DIAGNOSIS — L8990 Pressure ulcer of unspecified site, unspecified stage: Secondary | ICD-10-CM | POA: Diagnosis present

## 2016-02-02 LAB — BASIC METABOLIC PANEL
Anion gap: 8 (ref 5–15)
BUN: 24 mg/dL — AB (ref 6–20)
CHLORIDE: 98 mmol/L — AB (ref 101–111)
CO2: 28 mmol/L (ref 22–32)
CREATININE: 0.81 mg/dL (ref 0.61–1.24)
Calcium: 7.5 mg/dL — ABNORMAL LOW (ref 8.9–10.3)
GFR calc Af Amer: >60 mL/min (ref >60)
GFR calc non Af Amer: >60 mL/min (ref >60)
GLUCOSE: 104 mg/dL — AB (ref 65–99)
Potassium: 2.9 mmol/L — ABNORMAL LOW (ref 3.5–5.1)
Sodium: 134 mmol/L — ABNORMAL LOW (ref 135–145)

## 2016-02-02 LAB — CBC
HEMATOCRIT: 32.9 % — AB (ref 39.0–52.0)
HEMOGLOBIN: 11 g/dL — AB (ref 13.0–17.0)
MCH: 25.9 pg — AB (ref 26.0–34.0)
MCHC: 33.4 g/dL (ref 30.0–36.0)
MCV: 77.6 fL — ABNORMAL LOW (ref 78.0–100.0)
Platelets: ADEQUATE 10*3/uL (ref 150–400)
RBC: 4.24 MIL/uL (ref 4.22–5.81)
RDW: 18.2 % — ABNORMAL HIGH (ref 11.5–15.5)
WBC: 11.8 10*3/uL — ABNORMAL HIGH (ref 4.0–10.5)

## 2016-02-02 LAB — PROTIME-INR
INR: 3.5
Prothrombin Time: 36 seconds — ABNORMAL HIGH (ref 11.4–15.2)

## 2016-02-02 LAB — GLUCOSE, CAPILLARY
GLUCOSE-CAPILLARY: 98 mg/dL (ref 65–99)
GLUCOSE-CAPILLARY: 134 mg/dL — AB (ref 65–99)
Glucose-Capillary: 142 mg/dL — ABNORMAL HIGH (ref 65–99)
Glucose-Capillary: 120 mg/dL — ABNORMAL HIGH (ref 65–99)

## 2016-02-02 LAB — MAGNESIUM: Magnesium: 1.5 mg/dL — ABNORMAL LOW (ref 1.7–2.4)

## 2016-02-02 MED ORDER — POTASSIUM CHLORIDE CRYS ER 20 MEQ PO TBCR
40.0000 meq | EXTENDED_RELEASE_TABLET | ORAL | Status: AC
  Administered 2016-02-02 – 2016-02-03 (×5): 40 meq via ORAL
  Filled 2016-02-02 (×5): qty 2

## 2016-02-02 NOTE — Progress Notes (Signed)
PROGRESS NOTE    Cameron Chandler  WUJ:[id number] DOB: 04/12/1955 DOA: 01/30/2016 PCP: [name], MD     Brief Narrative:  60 y/o man admitted from home on 11/3 with SOB. Has h/o adenocarcinoma of the lung s/p pleurex placement on the right for recurrent malignant pleural effusions.   Assessment & Plan:   Principal Problem:   Acute on chronic diastolic CHF (congestive heart failure) (HCC) Active Problems:   Essential hypertension   CAD, NATIVE VESSEL   Diabetes mellitus without complication (HCC)   Pleural effusion, bilateral   Hypothyroidism, acquired   Adenocarcinoma of right lung, stage 4 (HCC)   Acute respiratory failure with hypoxia (HCC)   DVT of upper extremity (deep vein thrombosis) (HCC)   Hypoxia   Hyponatremia   Hypokalemia   Supratherapeutic INR   Thrombocytopenia (HCC)   Dysphagia   Protein-calorie malnutrition, severe   Goals of care, counseling/discussion   Palliative care encounter   DNR (do not resuscitate) discussion   Pressure injury of skin   Dyspnea -Likely multifactorial: recurrent bilateral pleural effusions, acute diastolic CHF and lung cancer. -CT chest was negative for PE, had small PTX. Discussed with Dr. Arnoldo Morale, no need for chest tube placement. -Will drain right pleurex today. -See no need for left thoracentesis as he is already remarkably improved symptomatically. -ECHO: EF 95-62%, grade 1 diastolic dysfunction. -Continue lasix 40 mg IV BID, doubt accuracy of Is and Os. -Volume status improving.  LUE DVT -Continue coumadin (on hold since INR remains supratherapeutic).  Hypokalemia -Replete orally, check Mg level.  Lung Cancer -Per patient, he is considering hospice. -Appreciate palliative care input. -Will f/u with oncologist as scheduled as an OP.   DVT prophylaxis: Coumadin Code Status: full code Family Communication: daughter at bedside Disposition Plan: home when ready; likely 24-48 hours  Consultants:    Palliative care  Procedures:   none  Antimicrobials:   none    Subjective: Feels improved, was able to sit in chair yesterday for a long time.  Objective: Vitals:   02/01/16 1537 02/01/16 2116 02/02/16 0500 02/02/16 0540  BP: 102/72 110/76  113/83  Pulse: (!) 104 (!) 118  (!) 106  Resp: '20 18  20  '$ Temp: 98.5 F (36.9 C) 99 F (37.2 C)  98.2 F (36.8 C)  TempSrc: Oral Oral  Oral  SpO2: 96% 100%  98%  Weight:   86.2 kg (190 lb 0.6 oz)   Height:        Intake/Output Summary (Last 24 hours) at 02/02/16 1240 Last data filed at 02/02/16 0900  Gross per 24 hour  Intake              243 ml  Output             1125 ml  Net             -882 ml   Filed Weights   01/31/16 0220 02/01/16 0553 02/02/16 0500  Weight: 85 kg (187 lb 8 oz) 86.8 kg (191 lb 6.4 oz) 86.2 kg (190 lb 0.6 oz)    Examination:  General exam: Alert, awake, oriented x 3 Respiratory system: Decreased BS bilateral bases. Cardiovascular system:RRR. No murmurs, rubs, gallops. Gastrointestinal system: Abdomen is nondistended, soft and nontender. No organomegaly or masses felt. Normal bowel sounds heard. Central nervous system: Alert and oriented. No focal neurological deficits. Extremities: No C/C/E, +pedal pulses Skin: No rashes, lesions or ulcers Psychiatry: Judgement and insight appear normal. Mood & affect appropriate.  Data Reviewed: I have personally reviewed following labs and imaging studies  CBC:  Recent Labs Lab 01/30/16 2241 02/01/16 0518 02/02/16 0702  WBC 5.2 8.5 11.8*  HGB 13.2 11.0* 11.0*  HCT 38.7* 33.4* 32.9*  MCV 76.6* 77.7* 77.6*  PLT 111* 148* PLATELET CLUMPS NOTED ON SMEAR, COUNT APPEARS ADEQUATE   Basic Metabolic Panel:  Recent Labs Lab 01/30/16 2241 01/31/16 0312 02/01/16 0518 02/02/16 0702  NA 133*  --  136 134*  K 3.3*  --  3.2* 2.9*  CL 96*  --  100* 98*  CO2 28  --  27 28  GLUCOSE 75  --  102* 104*  BUN 23*  --  22* 24*  CREATININE 0.83  --  0.69  0.81  CALCIUM 7.6*  --  7.7* 7.5*  MG  --  1.6*  --  1.5*   GFR: Estimated Creatinine Clearance: 97.4 mL/min (by C-G formula based on SCr of 0.81 mg/dL). Liver Function Tests:  Recent Labs Lab 01/30/16 2241  AST 61*  ALT 44  ALKPHOS 72  BILITOT 0.2*  PROT 6.0*  ALBUMIN 1.5*   No results for input(s): LIPASE, AMYLASE in the last 168 hours. No results for input(s): AMMONIA in the last 168 hours. Coagulation Profile:  Recent Labs Lab 01/30/16 2241 01/31/16 0627 02/01/16 0518 02/02/16 0702  INR 6.92* 5.50* 4.45* 3.50   Cardiac Enzymes:  Recent Labs Lab 01/31/16 0312 01/31/16 0803 01/31/16 1440  TROPONINI 0.03* 0.03* 0.04*   BNP (last 3 results)  Recent Labs  11/21/15 1641  PROBNP 25.0   HbA1C: No results for input(s): HGBA1C in the last 72 hours. CBG:  Recent Labs Lab 02/01/16 1127 02/01/16 1645 02/01/16 2128 02/02/16 0806 02/02/16 1126  GLUCAP 129* 133* 167* 98 142*   Lipid Profile: No results for input(s): CHOL, HDL, LDLCALC, TRIG, CHOLHDL, LDLDIRECT in the last 72 hours. Thyroid Function Tests: No results for input(s): TSH, T4TOTAL, FREET4, T3FREE, THYROIDAB in the last 72 hours. Anemia Panel: No results for input(s): VITAMINB12, FOLATE, FERRITIN, TIBC, IRON, RETICCTPCT in the last 72 hours. Urine analysis:    Component Value Date/Time   COLORURINE YELLOW 01/26/2015 [place] 01/26/2015 0047   LABSPEC 1.018 01/26/2015 0047   PHURINE 5.5 01/26/2015 [place] 01/26/2015 0047   HGBUR NEGATIVE 01/26/2015 0047   BILIRUBINUR NEGATIVE 01/26/2015 0047   [place] 01/26/2015 0047   PROTEINUR NEGATIVE 01/26/2015 0047   UROBILINOGEN 0.2 01/26/2015 0047   NITRITE NEGATIVE 01/26/2015 0047   LEUKOCYTESUR NEGATIVE 01/26/2015 0047   Sepsis Labs: '@LABRCNTIP'$ (procalcitonin:4,lacticidven:4)  )No results found for this or any previous visit (from the past 240 hour(s)).       Radiology Studies: No results  found.      Scheduled Meds: . aspirin EC  81 mg Oral Daily  . atorvastatin  40 mg Oral q1800  . fluconazole  100 mg Oral Daily  . folic acid  1 mg Oral Daily  . furosemide  40 mg Intravenous Q12H  . gabapentin  100 mg Oral TID  . insulin aspart  0-9 Units Subcutaneous TID WC  . levothyroxine  25 mcg Oral QAC breakfast  . meloxicam  7.5 mg Oral Daily  . metoprolol tartrate  12.5 mg Oral BID  . potassium chloride  10 mEq Oral Daily  . potassium chloride  40 mEq Oral Q4H  . sodium chloride flush  3 mL Intravenous Q12H  . Warfarin - Pharmacist Dosing Inpatient   Does not apply  Q24H   Continuous Infusions:   LOS: 2 days    Time spent: 25 minutes. Greater than 50% of this time was spent in direct contact with the patient coordinating care.     Lelon Frohlich, MD [facility] Pager [phone]  If 7PM-7AM, please contact night-coverage [email or web address] Password [email or web address] 02/02/2016, 12:40 PM

## 2016-02-02 NOTE — Progress Notes (Signed)
[place] for Warfarin Indication: VTE Treatment  Allergies  Allergen Reactions  . No Known Allergies    Patient Measurements: Height: '5\' 6"'$  (167.6 cm) Weight: 190 lb 0.6 oz (86.2 kg) IBW/kg (Calculated) : 63.8  Vital Signs: Temp: 98.2 F (36.8 C) (11/05 0540) Temp Source: Oral (11/05 0540) BP: 113/83 (11/05 0540) Pulse Rate: 106 (11/05 0540)  Labs:  Recent Labs  01/30/16 2241 01/31/16 0312 01/31/16 0627 01/31/16 0803 01/31/16 1440 02/01/16 0518 02/02/16 0702  HGB 13.2  --   --   --   --  11.0* 11.0*  HCT 38.7*  --   --   --   --  33.4* 32.9*  PLT 111*  --   --   --   --  148* PLATELET CLUMPS NOTED ON SMEAR, COUNT APPEARS ADEQUATE  APTT 131*  --   --   --   --   --   --   LABPROT 62.1*  --  51.6*  --   --  43.6* 36.0*  INR 6.92*  --  5.50*  --   --  4.45* 3.50  CREATININE 0.83  --   --   --   --  0.69 0.81  TROPONINI  --  0.03*  --  0.03* 0.04*  --   --    Estimated Creatinine Clearance: 97.4 mL/min (by C-G formula based on SCr of 0.81 mg/dL).  Medical History: Past Medical History:  Diagnosis Date  . Adenocarcinoma of right lung, stage 4 ([place]) 12/03/2015  . Arthritis    back, joint pain  . Bone cancer ([place])    T 1T2  . Cancer [facility]) 2011   colon cancer  . Colon cancer [facility])    Colon resection and Chemotherapy  . Coronary atherosclerosis of native coronary artery   . Diabetes mellitus without complication ([place])    takes Meformin and Levemir daily  . DVT (deep venous thrombosis) (HCC)    Left arm  . Dyspnea    lying,sitting,exertion  . Heart attack   . Hypothyroidism    takes Synthroid daily  . Myocardial infarction 2007  . Nocturia   . Other and unspecified hyperlipidemia    takes Simvastatin daily  . Prostate cancer ([place]) 12/13/12   ,Radiation  . Seizures ([place])    hx of-had one over 20 yrs ago  . Tingling    hands  . Unspecified essential hypertension    takes Diovan and Metoprolol daily  . Weakness    in  legs   Medications:  Prescriptions Prior to Admission  Medication Sig Dispense Refill Last Dose  . aspirin EC 81 MG tablet Take 81 mg by mouth daily.   01/28/2016 at Unknown time  . dexamethasone (DECADRON) 4 MG tablet 4 mg by mouth twice a day the day before, day of and day after the chemotherapy every 3 weeks (Patient taking differently: Take 4 mg by mouth 2 (two) times daily. 4 mg by mouth twice a day the day before, day of and day after the chemotherapy every 3 weeks) 40 tablet 1 01/13/2016 at Unknown time  . Fenofibric Acid 105 MG TABS Take 1 tablet by mouth daily.   29/93/7169  . folic acid (FOLVITE) 1 MG tablet Take 1 tablet (1 mg total) by mouth daily. 30 tablet 4 01/28/2016 at Unknown time  . furosemide (LASIX) 40 MG tablet Take 1 tablet (40 mg total) by mouth daily. 14 tablet 1 01/28/2016 at Unknown time  .  gabapentin (NEURONTIN) 100 MG capsule Take 1 capsule (100 mg total) by mouth 3 (three) times daily. (Patient taking differently: Take 300 mg by mouth 3 (three) times daily. ) 90 capsule 0 01/28/2016 at Unknown time  . hydrochlorothiazide (HYDRODIURIL) 25 MG tablet Take 25 mg by mouth daily.   01/28/2016 at Unknown time  . HYDROcodone-acetaminophen (NORCO) 7.5-325 MG tablet Take 1 tablet by mouth 3 (three) times daily. 30 tablet 0 01/30/2016 at Unknown time  . levothyroxine (SYNTHROID, LEVOTHROID) 25 MCG tablet Take 1 tablet (25 mcg total) by mouth daily before breakfast. 30 tablet 11 01/28/2016 at Unknown time  . lidocaine-prilocaine (EMLA) cream Apply 1 application topically as needed. (Patient taking differently: Apply 1 application topically as needed (for port access). ) 30 g 0 unknown  . meloxicam (MOBIC) 7.5 MG tablet Take 7.5 mg by mouth daily.   01/28/2016 at Unknown time  . metFORMIN (GLUMETZA) 500 MG (MOD) 24 hr tablet Take 1,000 mg by mouth 2 (two) times daily with a meal.   01/28/2016  . metoprolol tartrate (LOPRESSOR) 25 MG tablet Take 12.5 mg by mouth 2 (two) times daily.    01/28/2016 at Unknown time  . potassium chloride (K-DUR,KLOR-CON) 10 MEQ tablet Take 1 tablet (10 mEq total) by mouth daily. 14 tablet 1 01/28/2016 at Unknown time  . prochlorperazine (COMPAZINE) 10 MG tablet Take 1 tablet (10 mg total) by mouth every 6 (six) hours as needed for nausea or vomiting. 30 tablet 0 unknown  . simvastatin (ZOCOR) 80 MG tablet TAKE 1 TABLET BY MOUTH DAILY AT 6 P.M. 30 tablet 3 01/28/2016 at Unknown time  . valsartan (DIOVAN) 160 MG tablet Take 160 mg by mouth daily.   01/28/2016  . warfarin (COUMADIN) 5 MG tablet Take 5 mg by mouth daily.   1 01/28/2016 at Unknown time  . Wound Dressings (SONAFINE) Apply 1 application topically daily.   01/28/2016 at Unknown time  . insulin detemir (LEVEMIR) 100 unit/ml SOLN Inject 30 Units into the skin at bedtime.   over 30 days  . nitroGLYCERIN (NITROSTAT) 0.4 MG SL tablet Place 0.4 mg under the tongue every 5 (five) minutes as needed for chest pain.   unknown   Assessment: 60 yo male taking warfarin PTA for DVT in left extremity. INR elevated on admission, remains elevated today  Goal of Therapy:  INR 2-3 Monitor platelets by anticoagulation protocol: Yes   Plan:  No warfarin today due to elevated INR, allow INR to trend down INR/PT daily Monitor CBC, platelets, signs of bleeding  [place] Crane, [place] A 02/02/2016,8:54 AM

## 2016-02-02 NOTE — Progress Notes (Signed)
Patient had O2 pulled off, checked sats, ranged from 63-68% on RA.  Placed back on 4L, came up to 98%.  BP low as well 90/60.  Patient has no complaints of dizziness or feeling light headed, will continue to monitor.  Up in chair, tolerating well.

## 2016-02-03 ENCOUNTER — Ambulatory Visit: Payer: Medicare HMO

## 2016-02-03 ENCOUNTER — Other Ambulatory Visit: Payer: Medicare HMO

## 2016-02-03 ENCOUNTER — Inpatient Hospital Stay (HOSPITAL_COMMUNITY): Payer: Medicare HMO

## 2016-02-03 LAB — BASIC METABOLIC PANEL
Anion gap: 7 (ref 5–15)
BUN: 27 mg/dL — AB (ref 6–20)
CHLORIDE: 102 mmol/L (ref 101–111)
CO2: 26 mmol/L (ref 22–32)
Calcium: 7.9 mg/dL — ABNORMAL LOW (ref 8.9–10.3)
Creatinine, Ser: 0.77 mg/dL (ref 0.61–1.24)
GFR calc Af Amer: >60 mL/min (ref >60)
GFR calc non Af Amer: >60 mL/min (ref >60)
GLUCOSE: 112 mg/dL — AB (ref 65–99)
POTASSIUM: 4.5 mmol/L (ref 3.5–5.1)
Sodium: 135 mmol/L (ref 135–145)

## 2016-02-03 LAB — LACTATE DEHYDROGENASE: LDH: 481 U/L — AB (ref 98–192)

## 2016-02-03 LAB — CBC
HCT: 35.3 % — ABNORMAL LOW (ref 39.0–52.0)
Hemoglobin: 11.5 g/dL — ABNORMAL LOW (ref 13.0–17.0)
MCH: 25.8 pg — ABNORMAL LOW (ref 26.0–34.0)
MCHC: 32.6 g/dL (ref 30.0–36.0)
MCV: 79.1 fL (ref 78.0–100.0)
PLATELETS: 183 10*3/uL (ref 150–400)
RBC: 4.46 MIL/uL (ref 4.22–5.81)
RDW: 18.7 % — AB (ref 11.5–15.5)
WBC: 17.5 10*3/uL — ABNORMAL HIGH (ref 4.0–10.5)

## 2016-02-03 LAB — BODY FLUID CELL COUNT WITH DIFFERENTIAL
EOS FL: 0 %
Lymphs, Fluid: 24 %
Monocyte-Macrophage-Serous Fluid: 11 % — ABNORMAL LOW (ref 50–90)
Neutrophil Count, Fluid: 65 % — ABNORMAL HIGH (ref 0–25)
WBC FLUID: 1642 uL — AB (ref 0–1000)

## 2016-02-03 LAB — PROTIME-INR
INR: 2.51
Prothrombin Time: 27.6 seconds — ABNORMAL HIGH (ref 11.4–15.2)

## 2016-02-03 LAB — GLUCOSE, CAPILLARY
GLUCOSE-CAPILLARY: 100 mg/dL — AB (ref 65–99)
GLUCOSE-CAPILLARY: 68 mg/dL (ref 65–99)
GLUCOSE-CAPILLARY: 132 mg/dL — AB (ref 65–99)
Glucose-Capillary: 137 mg/dL — ABNORMAL HIGH (ref 65–99)

## 2016-02-03 LAB — PROTEIN, BODY FLUID

## 2016-02-03 LAB — LACTATE DEHYDROGENASE, PLEURAL OR PERITONEAL FLUID: LD, Fluid: 211 U/L — ABNORMAL HIGH (ref 3–23)

## 2016-02-03 LAB — GLUCOSE, SEROUS FLUID: GLUCOSE FL: 119 mg/dL

## 2016-02-03 MED ORDER — WARFARIN SODIUM 5 MG PO TABS: 5.0000 mg | ORAL_TABLET | Freq: Once | ORAL | Status: DC

## 2016-02-03 MED ORDER — SODIUM CHLORIDE 0.9 % IV SOLN
1500.0000 mg | Freq: Once | INTRAVENOUS | Status: AC
  Administered 2016-02-03: 1500 mg via INTRAVENOUS
  Filled 2016-02-03: qty 1500

## 2016-02-03 MED ORDER — MAGNESIUM OXIDE 400 (241.3 MG) MG PO TABS
400.0000 mg | ORAL_TABLET | Freq: Three times a day (TID) | ORAL | Status: DC
  Administered 2016-02-03 – 2016-02-07 (×14): 400 mg via ORAL
  Filled 2016-02-03 (×14): qty 1

## 2016-02-03 MED ORDER — CEFEPIME HCL 1 G IJ SOLR
1.0000 g | Freq: Three times a day (TID) | INTRAMUSCULAR | Status: DC
  Administered 2016-02-03 – 2016-02-07 (×12): 1 g via INTRAVENOUS
  Filled 2016-02-03 (×23): qty 1

## 2016-02-03 MED ORDER — VITAMIN K1 10 MG/ML IJ SOLN
10.0000 mg | Freq: Once | INTRAVENOUS | Status: AC
  Administered 2016-02-03: 10 mg via INTRAVENOUS
  Filled 2016-02-03: qty 1

## 2016-02-03 MED ORDER — VANCOMYCIN HCL IN DEXTROSE 1-5 GM/200ML-% IV SOLN
1000.0000 mg | Freq: Three times a day (TID) | INTRAVENOUS | Status: DC
  Administered 2016-02-03 – 2016-02-05 (×7): 1000 mg via INTRAVENOUS
  Filled 2016-02-03 (×7): qty 200

## 2016-02-03 NOTE — Progress Notes (Signed)
PROGRESS NOTE    Cameron Chandler  OEU:[id number] DOB: [birth year] DOA: 01/30/2016 PCP: [name], MD     Brief Narrative:  60 y/o man admitted from home on 11/3 with SOB. Has h/o adenocarcinoma of the lung s/p pleurex placement on the right for recurrent malignant pleural effusions.   Assessment & Plan:   Principal Problem:   Acute on chronic diastolic CHF (congestive heart failure) (HCC) Active Problems:   Essential hypertension   CAD, NATIVE VESSEL   Diabetes mellitus without complication (HCC)   Pleural effusion, bilateral   Hypothyroidism, acquired   Adenocarcinoma of right lung, stage 4 (HCC)   Acute respiratory failure with hypoxia (HCC)   DVT of upper extremity (deep vein thrombosis) (HCC)   Hypoxia   Hyponatremia   Hypokalemia   Supratherapeutic INR   Thrombocytopenia (HCC)   Dysphagia   Protein-calorie malnutrition, severe   Goals of care, counseling/discussion   Palliative care encounter   DNR (do not resuscitate) discussion   Pressure injury of skin   Dyspnea -Likely multifactorial: recurrent bilateral pleural effusions, acute diastolic CHF and lung cancer. -CT chest was negative for PE, had small PTX. Discussed with Dr. Arnoldo Morale, no need for chest tube placement. -Will drain right pleurex today. -CXR to be done today to evaluate left effusion as she still has an oxygen requirement which is new. Hold coumadin today as he may need thoracentesis pending results. -ECHO: EF 15-40%, grade 1 diastolic dysfunction. -Continue lasix 40 mg IV BID, doubt accuracy of Is and Os. -Volume status improving.  LUE DVT -Continue coumadin as dose by pharmacy. -May need to hold dose today if we determine a need for thoracentesis.  Hypokalemia/Hypomagnesemia -Replaced -Give mag/oxide for 7 days.  Lung Cancer -Per patient, he is considering hospice. -Appreciate palliative care input. -Will f/u with oncologist as scheduled as an OP.   DVT prophylaxis:  Coumadin Code Status: full code Family Communication: daughter at bedside Disposition Plan: home when ready; likely 24-48 hours  Consultants:   Palliative care  Procedures:   none  Antimicrobials:   none    Subjective: Feels improved, was able to sit in chair yesterday for a long time.  Objective: Vitals:   02/02/16 1428 02/02/16 2113 02/03/16 0605 02/03/16 0800  BP: 103/74 112/87 139/84 107/90  Pulse: 100 (!) 104 78 (!) 114  Resp: '20 20 20 20  '$ Temp: 97.7 F (36.5 C) 97.7 F (36.5 C) 97.5 F (36.4 C) 97.3 F (36.3 C)  TempSrc:  Oral Oral Oral  SpO2:  99% 92% 98%  Weight:   85.4 kg (188 lb 4.4 oz)   Height:        Intake/Output Summary (Last 24 hours) at 02/03/16 1037 Last data filed at 02/03/16 0934  Gross per 24 hour  Intake              443 ml  Output              450 ml  Net               -7 ml   Filed Weights   02/01/16 0553 02/02/16 0500 02/03/16 0605  Weight: 86.8 kg (191 lb 6.4 oz) 86.2 kg (190 lb 0.6 oz) 85.4 kg (188 lb 4.4 oz)    Examination:  General exam: Alert, awake, oriented x 3 Respiratory system: Decreased BS bilateral bases. Cardiovascular system:RRR. No murmurs, rubs, gallops. Gastrointestinal system: Abdomen is nondistended, soft and nontender. No organomegaly or masses felt. Normal bowel sounds  heard. Central nervous system: Alert and oriented. No focal neurological deficits. Extremities: No C/C/E, +pedal pulses Skin: No rashes, lesions or ulcers Psychiatry: Judgement and insight appear normal. Mood & affect appropriate.     Data Reviewed: I have personally reviewed following labs and imaging studies  CBC:  Recent Labs Lab 01/30/16 2241 02/01/16 0518 02/02/16 0702 02/03/16 0642  WBC 5.2 8.5 11.8* 17.5*  HGB 13.2 11.0* 11.0* 11.5*  HCT 38.7* 33.4* 32.9* 35.3*  MCV 76.6* 77.7* 77.6* 79.1  PLT 111* 148* PLATELET CLUMPS NOTED ON SMEAR, COUNT APPEARS ADEQUATE 470   Basic Metabolic Panel:  Recent Labs Lab 01/30/16 2241  01/31/16 0312 02/01/16 0518 02/02/16 0702 02/03/16 0642  NA 133*  --  136 134* 135  K 3.3*  --  3.2* 2.9* 4.5  CL 96*  --  100* 98* 102  CO2 28  --  '27 28 26  '$ GLUCOSE 75  --  102* 104* 112*  BUN 23*  --  22* 24* 27*  CREATININE 0.83  --  0.69 0.81 0.77  CALCIUM 7.6*  --  7.7* 7.5* 7.9*  MG  --  1.6*  --  1.5*  --    GFR: Estimated Creatinine Clearance: 98 mL/min (by C-G formula based on SCr of 0.77 mg/dL). Liver Function Tests:  Recent Labs Lab 01/30/16 2241  AST 61*  ALT 44  ALKPHOS 72  BILITOT 0.2*  PROT 6.0*  ALBUMIN 1.5*   No results for input(s): LIPASE, AMYLASE in the last 168 hours. No results for input(s): AMMONIA in the last 168 hours. Coagulation Profile:  Recent Labs Lab 01/30/16 2241 01/31/16 0627 02/01/16 0518 02/02/16 0702 02/03/16 0642  INR 6.92* 5.50* 4.45* 3.50 2.51   Cardiac Enzymes:  Recent Labs Lab 01/31/16 0312 01/31/16 0803 01/31/16 1440  TROPONINI 0.03* 0.03* 0.04*   BNP (last 3 results)  Recent Labs  11/21/15 1641  PROBNP 25.0   HbA1C: No results for input(s): HGBA1C in the last 72 hours. CBG:  Recent Labs Lab 02/02/16 0806 02/02/16 1126 02/02/16 1636 02/02/16 2124 02/03/16 0729  GLUCAP 98 142* 120* 134* 137*   Lipid Profile: No results for input(s): CHOL, HDL, LDLCALC, TRIG, CHOLHDL, LDLDIRECT in the last 72 hours. Thyroid Function Tests: No results for input(s): TSH, T4TOTAL, FREET4, T3FREE, THYROIDAB in the last 72 hours. Anemia Panel: No results for input(s): VITAMINB12, FOLATE, FERRITIN, TIBC, IRON, RETICCTPCT in the last 72 hours. Urine analysis:    Component Value Date/Time   COLORURINE YELLOW 01/26/2015 [place] 01/26/2015 0047   LABSPEC 1.018 01/26/2015 0047   PHURINE 5.5 01/26/2015 [place] 01/26/2015 0047   HGBUR NEGATIVE 01/26/2015 0047   BILIRUBINUR NEGATIVE 01/26/2015 0047   [place] 01/26/2015 0047   PROTEINUR NEGATIVE 01/26/2015 0047    UROBILINOGEN 0.2 01/26/2015 0047   NITRITE NEGATIVE 01/26/2015 0047   LEUKOCYTESUR NEGATIVE 01/26/2015 0047   Sepsis Labs: '@LABRCNTIP'$ (procalcitonin:4,lacticidven:4)  )No results found for this or any previous visit (from the past 240 hour(s)).       Radiology Studies: No results found.      Scheduled Meds: . aspirin EC  81 mg Oral Daily  . atorvastatin  40 mg Oral q1800  . fluconazole  100 mg Oral Daily  . folic acid  1 mg Oral Daily  . furosemide  40 mg Intravenous Q12H  . gabapentin  100 mg Oral TID  . insulin aspart  0-9 Units Subcutaneous TID WC  . levothyroxine  25 mcg Oral  QAC breakfast  . meloxicam  7.5 mg Oral Daily  . metoprolol tartrate  12.5 mg Oral BID  . potassium chloride  10 mEq Oral Daily  . sodium chloride flush  3 mL Intravenous Q12H  . Warfarin - Pharmacist Dosing Inpatient   Does not apply Q24H   Continuous Infusions:   LOS: 3 days    Time spent: 25 minutes. Greater than 50% of this time was spent in direct contact with the patient coordinating care.     Lelon Frohlich, MD [facility] Pager [phone]  If 7PM-7AM, please contact night-coverage [email or web address] Password [email or web address] 02/03/2016, 10:37 AM

## 2016-02-03 NOTE — Care Management Important Message (Signed)
Important Message  Patient Details  Name: Cameron Chandler MRN: [id number] Date of Birth: Feb 25, 1956   Medicare Important Message Given:  Yes    Sherald Barge, RN 02/03/2016, 10:48 AM

## 2016-02-03 NOTE — Progress Notes (Signed)
Thoracentesis complete no signs of distress. 1631m red colored pleural fluid removed.

## 2016-02-03 NOTE — Progress Notes (Signed)
Pharmacy Antibiotic Note  Cameron Chandler is a 60 y.o. male admitted on 01/30/2016 with pneumonia.  Pharmacy has been consulted for Vancomycin and Cefepime dosing.  Plan:  Vancomycin '1500mg'$  x 1 then '1000mg'$  IV q8h Check trough at steady state Cefepime 1gm IV q8h Monitor labs, renal fxn, progress and c/s Deescalate ABX when improved / appropriate.    Height: '5\' 6"'$  (167.6 cm) Weight: 188 lb 4.4 oz (85.4 kg) IBW/kg (Calculated) : 63.8  Temp (24hrs), Avg:97.6 F (36.4 C), Min:97.3 F (36.3 C), Max:97.7 F (36.5 C)   Recent Labs Lab 01/30/16 2241 01/31/16 0132 01/31/16 0312 01/31/16 0627 02/01/16 0518 02/02/16 0702 02/03/16 0642  WBC 5.2  --   --   --  8.5 11.8* 17.5*  CREATININE 0.83  --   --   --  0.69 0.81 0.77  LATICACIDVEN 2.4* 1.7 2.3* 0.9  --   --   --     Estimated Creatinine Clearance: 98 mL/min (by C-G formula based on SCr of 0.77 mg/dL).    Allergies  Allergen Reactions  . No Known Allergies    Antimicrobials this admission: Vancomycin 11/6 >>  Cefepime 11/6 >>   Dose adjustments this admission:  Microbiology results: 11/6 BCx: pending  UCx:   11/6 Sputum: pending   MRSA PCR:   Thank you for allowing pharmacy to be a part of this patient's care.  Hart Robinsons A 02/03/2016 2:16 PM

## 2016-02-03 NOTE — Progress Notes (Addendum)
[place] for Warfarin Indication: VTE Treatment  Allergies  Allergen Reactions  . No Known Allergies    Patient Measurements: Height: '5\' 6"'$  (167.6 cm) Weight: 188 lb 4.4 oz (85.4 kg) IBW/kg (Calculated) : 63.8  Vital Signs: Temp: 97.3 F (36.3 C) (11/06 0800) Temp Source: Oral (11/06 0800) BP: 107/90 (11/06 0800) Pulse Rate: 114 (11/06 0800)  Labs:  Recent Labs  01/31/16 1440  02/01/16 0518 02/02/16 0702 02/03/16 0642  HGB  --   < > 11.0* 11.0* 11.5*  HCT  --   --  33.4* 32.9* 35.3*  PLT  --   --  148* PLATELET CLUMPS NOTED ON SMEAR, COUNT APPEARS ADEQUATE 183  LABPROT  --   --  43.6* 36.0* 27.6*  INR  --   --  4.45* 3.50 2.51  CREATININE  --   --  0.69 0.81 0.77  TROPONINI 0.04*  --   --   --   --   < > = values in this interval not displayed. Estimated Creatinine Clearance: 98 mL/min (by C-G formula based on SCr of 0.77 mg/dL).  Medical History: Past Medical History:  Diagnosis Date  . Adenocarcinoma of right lung, stage 4 ([place]) 12/03/2015  . Arthritis    back, joint pain  . Bone cancer ([employer])    T 1T2  . Cancer [facility]) 2011   colon cancer  . Colon cancer [facility])    Colon resection and Chemotherapy  . Coronary atherosclerosis of native coronary artery   . Diabetes mellitus without complication ([place])    takes Meformin and Levemir daily  . DVT (deep venous thrombosis) (HCC)    Left arm  . Dyspnea    lying,sitting,exertion  . Heart attack   . Hypothyroidism    takes Synthroid daily  . Myocardial infarction 2007  . Nocturia   . Other and unspecified hyperlipidemia    takes Simvastatin daily  . Prostate cancer ([place]) 12/13/12   ,Radiation  . Seizures ([place])    hx of-had one over 20 yrs ago  . Tingling    hands  . Unspecified essential hypertension    takes Diovan and Metoprolol daily  . Weakness    in legs   Medications:  Prescriptions Prior to Admission  Medication Sig Dispense Refill Last Dose  . aspirin EC 81  MG tablet Take 81 mg by mouth daily.   01/28/2016 at Unknown time  . dexamethasone (DECADRON) 4 MG tablet 4 mg by mouth twice a day the day before, day of and day after the chemotherapy every 3 weeks (Patient taking differently: Take 4 mg by mouth 2 (two) times daily. 4 mg by mouth twice a day the day before, day of and day after the chemotherapy every 3 weeks) 40 tablet 1 01/13/2016 at Unknown time  . Fenofibric Acid 105 MG TABS Take 1 tablet by mouth daily.   47/11/6281  . folic acid (FOLVITE) 1 MG tablet Take 1 tablet (1 mg total) by mouth daily. 30 tablet 4 01/28/2016 at Unknown time  . furosemide (LASIX) 40 MG tablet Take 1 tablet (40 mg total) by mouth daily. 14 tablet 1 01/28/2016 at Unknown time  . gabapentin (NEURONTIN) 100 MG capsule Take 1 capsule (100 mg total) by mouth 3 (three) times daily. (Patient taking differently: Take 300 mg by mouth 3 (three) times daily. ) 90 capsule 0 01/28/2016 at Unknown time  . hydrochlorothiazide (HYDRODIURIL) 25 MG tablet Take 25 mg by mouth daily.  01/28/2016 at Unknown time  . HYDROcodone-acetaminophen (NORCO) 7.5-325 MG tablet Take 1 tablet by mouth 3 (three) times daily. 30 tablet 0 01/30/2016 at Unknown time  . levothyroxine (SYNTHROID, LEVOTHROID) 25 MCG tablet Take 1 tablet (25 mcg total) by mouth daily before breakfast. 30 tablet 11 01/28/2016 at Unknown time  . lidocaine-prilocaine (EMLA) cream Apply 1 application topically as needed. (Patient taking differently: Apply 1 application topically as needed (for port access). ) 30 g 0 unknown  . meloxicam (MOBIC) 7.5 MG tablet Take 7.5 mg by mouth daily.   01/28/2016 at Unknown time  . metFORMIN (GLUMETZA) 500 MG (MOD) 24 hr tablet Take 1,000 mg by mouth 2 (two) times daily with a meal.   01/28/2016  . metoprolol tartrate (LOPRESSOR) 25 MG tablet Take 12.5 mg by mouth 2 (two) times daily.   01/28/2016 at Unknown time  . potassium chloride (K-DUR,KLOR-CON) 10 MEQ tablet Take 1 tablet (10 mEq total) by  mouth daily. 14 tablet 1 01/28/2016 at Unknown time  . prochlorperazine (COMPAZINE) 10 MG tablet Take 1 tablet (10 mg total) by mouth every 6 (six) hours as needed for nausea or vomiting. 30 tablet 0 unknown  . simvastatin (ZOCOR) 80 MG tablet TAKE 1 TABLET BY MOUTH DAILY AT 6 P.M. 30 tablet 3 01/28/2016 at Unknown time  . valsartan (DIOVAN) 160 MG tablet Take 160 mg by mouth daily.   01/28/2016  . warfarin (COUMADIN) 5 MG tablet Take 5 mg by mouth daily.   1 01/28/2016 at Unknown time  . Wound Dressings (SONAFINE) Apply 1 application topically daily.   01/28/2016 at Unknown time  . insulin detemir (LEVEMIR) 100 unit/ml SOLN Inject 30 Units into the skin at bedtime.   over 30 days  . nitroGLYCERIN (NITROSTAT) 0.4 MG SL tablet Place 0.4 mg under the tongue every 5 (five) minutes as needed for chest pain.   unknown   Assessment: 60 yo male taking warfarin PTA for DVT in left extremity. INR elevated on admission but has trended down to therapeutic range after holding Coumadin.  Goal of Therapy:  INR 2-3 Monitor platelets by anticoagulation protocol: Yes   Plan:  HOLD coumadin today for possible thoracentesis (d/w Dr Jerilee Hoh) INR/PT daily Monitor CBC, platelets, signs of bleeding  [place] Crane, [place] Petrelli A 02/03/2016,9:19 AM

## 2016-02-03 NOTE — Progress Notes (Addendum)
Repeat CXR today shows signs of multifocal PNA. Will start vanc/cefepime and request cx data. Will also request left sided-thoracentesis (INR 2.5 today, continue to hold coumadin and will give some vit K today).  Domingo Mend, MD [facility] Pager: [phone]

## 2016-02-04 LAB — CBC
HEMATOCRIT: 35.1 % — AB (ref 39.0–52.0)
HEMOGLOBIN: 11.3 g/dL — AB (ref 13.0–17.0)
MCH: 25.6 pg — AB (ref 26.0–34.0)
MCHC: 32.2 g/dL (ref 30.0–36.0)
MCV: 79.4 fL (ref 78.0–100.0)
Platelets: 237 10*3/uL (ref 150–400)
RBC: 4.42 MIL/uL (ref 4.22–5.81)
RDW: 18.8 % — ABNORMAL HIGH (ref 11.5–15.5)
WBC: 26 10*3/uL — ABNORMAL HIGH (ref 4.0–10.5)

## 2016-02-04 LAB — COMPREHENSIVE METABOLIC PANEL
ALK PHOS: 98 U/L (ref 38–126)
ALT: 67 U/L — ABNORMAL HIGH (ref 17–63)
ANION GAP: 6 (ref 5–15)
AST: 62 U/L — ABNORMAL HIGH (ref 15–41)
Albumin: 1.2 g/dL — ABNORMAL LOW (ref 3.5–5.0)
BILIRUBIN TOTAL: 0.3 mg/dL (ref 0.3–1.2)
BUN: 26 mg/dL — ABNORMAL HIGH (ref 6–20)
CALCIUM: 7.8 mg/dL — AB (ref 8.9–10.3)
CO2: 27 mmol/L (ref 22–32)
CREATININE: 0.91 mg/dL (ref 0.61–1.24)
Chloride: 99 mmol/L — ABNORMAL LOW (ref 101–111)
GFR calc non Af Amer: >60 mL/min (ref >60)
Glucose, Bld: 109 mg/dL — ABNORMAL HIGH (ref 65–99)
Potassium: 4.7 mmol/L (ref 3.5–5.1)
Sodium: 132 mmol/L — ABNORMAL LOW (ref 135–145)
TOTAL PROTEIN: 5.3 g/dL — AB (ref 6.5–8.1)

## 2016-02-04 LAB — GLUCOSE, CAPILLARY
GLUCOSE-CAPILLARY: 115 mg/dL — AB (ref 65–99)
GLUCOSE-CAPILLARY: 117 mg/dL — AB (ref 65–99)
Glucose-Capillary: 133 mg/dL — ABNORMAL HIGH (ref 65–99)
Glucose-Capillary: 147 mg/dL — ABNORMAL HIGH (ref 65–99)

## 2016-02-04 LAB — GRAM STAIN

## 2016-02-04 LAB — PROTIME-INR
INR: 1.21
Prothrombin Time: 15.4 seconds — ABNORMAL HIGH (ref 11.4–15.2)

## 2016-02-04 MED ORDER — WARFARIN SODIUM 5 MG PO TABS
5.0000 mg | ORAL_TABLET | Freq: Once | ORAL | Status: AC
  Administered 2016-02-04: 5 mg via ORAL
  Filled 2016-02-04: qty 1

## 2016-02-04 MED ORDER — ENSURE ENLIVE PO LIQD
237.0000 mL | Freq: Two times a day (BID) | ORAL | Status: DC
  Administered 2016-02-05 – 2016-02-07 (×5): 237 mL via ORAL

## 2016-02-04 MED ORDER — WARFARIN - PHARMACIST DOSING INPATIENT
Status: DC
  Administered 2016-02-06: 16:00:00

## 2016-02-04 NOTE — Progress Notes (Signed)
[facility] for Warfarin Indication: VTE Treatment  Allergies  Allergen Reactions  . No Known Allergies    Patient Measurements: Height: '5\' 6"'$  (167.6 cm) Weight: 191 lb (86.6 kg) IBW/kg (Calculated) : 63.8  Vital Signs: Temp: 97.6 F (36.4 C) (11/07 0437) Temp Source: Oral (11/07 0437) BP: 116/69 (11/07 0437) Pulse Rate: 109 (11/07 0437)  Labs:  Recent Labs  02/02/16 0702 02/03/16 0642 02/04/16 0614  HGB 11.0* 11.5* 11.3*  HCT 32.9* 35.3* 35.1*  PLT PLATELET CLUMPS NOTED ON SMEAR, COUNT APPEARS ADEQUATE 183 237  LABPROT 36.0* 27.6* 15.4*  INR 3.50 2.51 1.21  CREATININE 0.81 0.77 0.91   Estimated Creatinine Clearance: 86.8 mL/min (by C-G formula based on SCr of 0.91 mg/dL).  Medical History: Past Medical History:  Diagnosis Date  . Adenocarcinoma of right lung, stage 4 ([place]) 12/03/2015  . Arthritis    back, joint pain  . Bone cancer ([place])    T 1T2  . Cancer [facility]) 2011   colon cancer  . Colon cancer [facility])    Colon resection and Chemotherapy  . Coronary atherosclerosis of native coronary artery   . Diabetes mellitus without complication ([place])    takes Meformin and Levemir daily  . DVT (deep venous thrombosis) (H[facility])    Left arm  . Dyspnea    lying,sitting,exertion  . Heart attack   . Hypothyroidism    takes Synthroid daily  . Myocardial infarction 2007  . Nocturia   . Other and unspecified hyperlipidemia    takes Simvastatin daily  . Prostate cancer ([place]) 12/13/12   ,Radiation  . Seizures ([place])    hx of-had one over 20 yrs ago  . Tingling    hands  . Unspecified essential hypertension    takes Diovan and Metoprolol daily  . Weakness    in legs   Medications:  Prescriptions Prior to Admission  Medication Sig Dispense Refill Last Dose  . aspirin EC 81 MG tablet Take 81 mg by mouth daily.   01/28/2016 at Unknown time  . dexamethasone (DECADRON) 4 MG tablet 4 mg by mouth twice a day the day before, day of and day  after the chemotherapy every 3 weeks (Patient taking differently: Take 4 mg by mouth 2 (two) times daily. 4 mg by mouth twice a day the day before, day of and day after the chemotherapy every 3 weeks) 40 tablet 1 01/13/2016 at Unknown time  . Fenofibric Acid 105 MG TABS Take 1 tablet by mouth daily.   69/62/9528  . folic acid (FOLVITE) 1 MG tablet Take 1 tablet (1 mg total) by mouth daily. 30 tablet 4 01/28/2016 at Unknown time  . furosemide (LASIX) 40 MG tablet Take 1 tablet (40 mg total) by mouth daily. 14 tablet 1 01/28/2016 at Unknown time  . gabapentin (NEURONTIN) 100 MG capsule Take 1 capsule (100 mg total) by mouth 3 (three) times daily. (Patient taking differently: Take 300 mg by mouth 3 (three) times daily. ) 90 capsule 0 01/28/2016 at Unknown time  . hydrochlorothiazide (HYDRODIURIL) 25 MG tablet Take 25 mg by mouth daily.   01/28/2016 at Unknown time  . HYDROcodone-acetaminophen (NORCO) 7.5-325 MG tablet Take 1 tablet by mouth 3 (three) times daily. 30 tablet 0 01/30/2016 at Unknown time  . levothyroxine (SYNTHROID, LEVOTHROID) 25 MCG tablet Take 1 tablet (25 mcg total) by mouth daily before breakfast. 30 tablet 11 01/28/2016 at Unknown time  . lidocaine-prilocaine (EMLA) cream Apply 1 application topically as needed. (  Patient taking differently: Apply 1 application topically as needed (for port access). ) 30 g 0 unknown  . meloxicam (MOBIC) 7.5 MG tablet Take 7.5 mg by mouth daily.   01/28/2016 at Unknown time  . metFORMIN (GLUMETZA) 500 MG (MOD) 24 hr tablet Take 1,000 mg by mouth 2 (two) times daily with a meal.   01/28/2016  . metoprolol tartrate (LOPRESSOR) 25 MG tablet Take 12.5 mg by mouth 2 (two) times daily.   01/28/2016 at Unknown time  . potassium chloride (K-DUR,KLOR-CON) 10 MEQ tablet Take 1 tablet (10 mEq total) by mouth daily. 14 tablet 1 01/28/2016 at Unknown time  . prochlorperazine (COMPAZINE) 10 MG tablet Take 1 tablet (10 mg total) by mouth every 6 (six) hours as needed for  nausea or vomiting. 30 tablet 0 unknown  . simvastatin (ZOCOR) 80 MG tablet TAKE 1 TABLET BY MOUTH DAILY AT 6 P.M. 30 tablet 3 01/28/2016 at Unknown time  . valsartan (DIOVAN) 160 MG tablet Take 160 mg by mouth daily.   01/28/2016  . warfarin (COUMADIN) 5 MG tablet Take 5 mg by mouth daily.   1 01/28/2016 at Unknown time  . Wound Dressings (SONAFINE) Apply 1 application topically daily.   01/28/2016 at Unknown time  . insulin detemir (LEVEMIR) 100 unit/ml SOLN Inject 30 Units into the skin at bedtime.   over 30 days  . nitroGLYCERIN (NITROSTAT) 0.4 MG SL tablet Place 0.4 mg under the tongue every 5 (five) minutes as needed for chest pain.   unknown   Assessment: 60 yo male taking warfarin PTA for DVT in left extremity. INR elevated on admission but has trended down after holding Coumadin and giving Vit. K.  S/P thoracentesis 11/6; 1649m red colored pleural fluid drained.  OK to restart warfarin today, discussed w/ MD.  Goal of Therapy:  INR 2-3 Monitor platelets by anticoagulation protocol: Yes   Plan:  Warfarin '5mg'$  PO x 1 INR/PT daily Monitor CBC, platelets, signs of bleeding  HEman, Morimoto11/09/2015,1:31 PM

## 2016-02-04 NOTE — Progress Notes (Signed)
PROGRESS NOTE    Cameron Chandler  RFF:[id number] DOB: 01-26-1956 DOA: 01/30/2016 PCP: [name], MD     Brief Narrative:  60 y/o man admitted from home on 11/3 with SOB. Has h/o adenocarcinoma of the lung s/p pleurex placement on the right for recurrent malignant pleural effusions.   Assessment & Plan:   Principal Problem:   Acute on chronic diastolic CHF (congestive heart failure) (HCC) Active Problems:   Essential hypertension   CAD, NATIVE VESSEL   Diabetes mellitus without complication (HCC)   Pleural effusion, bilateral   Hypothyroidism, acquired   Adenocarcinoma of right lung, stage 4 (HCC)   Acute respiratory failure with hypoxia (HCC)   DVT of upper extremity (deep vein thrombosis) (HCC)   Hypoxia   Hyponatremia   Hypokalemia   Supratherapeutic INR   Thrombocytopenia (HCC)   Dysphagia   Protein-calorie malnutrition, severe   Goals of care, counseling/discussion   Palliative care encounter   DNR (do not resuscitate) discussion   Pressure injury of skin   Dyspnea -Likely multifactorial: recurrent bilateral pleural effusions, acute diastolic CHF, lung cancer and HCAP. -CT chest was negative for PE, had small PTX. Discussed with Dr. Arnoldo Morale, no need for chest tube placement. -Right pleurex drained QOD. -ECHO: EF 35-70%, grade 1 diastolic dysfunction. -Continue lasix 40 mg IV BID, doubt accuracy of Is and Os. -Volume status improving. -Left thoracentesis yesterday yields was appears to be an exudative effusion which may be related to PNA or even lung ca. -Continue vanc/cefepime for HCAP coverage.  LUE DVT -Restart coumadin today without bridging (held for thoracentesis).  Hypokalemia/Hypomagnesemia -Replaced -Give mag/oxide for 7 days.  Lung Cancer -Per patient, he is considering hospice. -Appreciate palliative care input. -Will f/u with oncologist as scheduled as an OP.   DVT prophylaxis: Coumadin Code Status: full code Family Communication:  daughter at bedside Disposition Plan: home when ready; likely 48 hours  Consultants:   Palliative care  Procedures:   none  Antimicrobials:   none    Subjective: Feels improved, was able to sit in chair yesterday for a long time.  Objective: Vitals:   02/03/16 1449 02/03/16 1557 02/03/16 2224 02/04/16 0437  BP: 99/66 (!) 103/48 104/81 116/69  Pulse: 96 (!) 107 (!) 114 (!) 109  Resp: '16 18 18 17  '$ Temp:  98.7 F (37.1 C) 97.6 F (36.4 C) 97.6 F (36.4 C)  TempSrc:  Oral Oral Oral  SpO2: 96% 98% 99% 100%  Weight:    86.6 kg (191 lb)  Height:        Intake/Output Summary (Last 24 hours) at 02/04/16 1502 Last data filed at 02/04/16 0300  Gross per 24 hour  Intake             1093 ml  Output              800 ml  Net              293 ml   Filed Weights   02/02/16 0500 02/03/16 0605 02/04/16 0437  Weight: 86.2 kg (190 lb 0.6 oz) 85.4 kg (188 lb 4.4 oz) 86.6 kg (191 lb)    Examination:  General exam: Alert, awake, oriented x 3 Respiratory system: Decreased BS bilateral bases. Cardiovascular system:RRR. No murmurs, rubs, gallops. Gastrointestinal system: Abdomen is nondistended, soft and nontender. No organomegaly or masses felt. Normal bowel sounds heard. Central nervous system: Alert and oriented. No focal neurological deficits. Extremities: No C/C/E, +pedal pulses Skin: No rashes, lesions or  ulcers Psychiatry: Judgement and insight appear normal. Mood & affect appropriate.     Data Reviewed: I have personally reviewed following labs and imaging studies  CBC:  Recent Labs Lab 01/30/16 2241 02/01/16 0518 02/02/16 0702 02/03/16 0642 02/04/16 0614  WBC 5.2 8.5 11.8* 17.5* 26.0*  HGB 13.2 11.0* 11.0* 11.5* 11.3*  HCT 38.7* 33.4* 32.9* 35.3* 35.1*  MCV 76.6* 77.7* 77.6* 79.1 79.4  PLT 111* 148* PLATELET CLUMPS NOTED ON SMEAR, COUNT APPEARS ADEQUATE 183 427   Basic Metabolic Panel:  Recent Labs Lab 01/30/16 2241 01/31/16 0312 02/01/16 0518  02/02/16 0702 02/03/16 0642 02/04/16 0614  NA 133*  --  136 134* 135 132*  K 3.3*  --  3.2* 2.9* 4.5 4.7  CL 96*  --  100* 98* 102 99*  CO2 28  --  '27 28 26 27  '$ GLUCOSE 75  --  102* 104* 112* 109*  BUN 23*  --  22* 24* 27* 26*  CREATININE 0.83  --  0.69 0.81 0.77 0.91  CALCIUM 7.6*  --  7.7* 7.5* 7.9* 7.8*  MG  --  1.6*  --  1.5*  --   --    GFR: Estimated Creatinine Clearance: 86.8 mL/min (by C-G formula based on SCr of 0.91 mg/dL). Liver Function Tests:  Recent Labs Lab 01/30/16 2241 02/04/16 0614  AST 61* 62*  ALT 44 67*  ALKPHOS 72 98  BILITOT 0.2* 0.3  PROT 6.0* 5.3*  ALBUMIN 1.5* 1.2*   No results for input(s): LIPASE, AMYLASE in the last 168 hours. No results for input(s): AMMONIA in the last 168 hours. Coagulation Profile:  Recent Labs Lab 01/31/16 0627 02/01/16 0518 02/02/16 0702 02/03/16 0642 02/04/16 0614  INR 5.50* 4.45* 3.50 2.51 1.21   Cardiac Enzymes:  Recent Labs Lab 01/31/16 0312 01/31/16 0803 01/31/16 1440  TROPONINI 0.03* 0.03* 0.04*   BNP (last 3 results)  Recent Labs  11/21/15 1641  PROBNP 25.0   HbA1C: No results for input(s): HGBA1C in the last 72 hours. CBG:  Recent Labs Lab 02/03/16 1103 02/03/16 1633 02/03/16 2221 02/04/16 0723 02/04/16 1143  GLUCAP 100* 68 132* 133* 115*   Lipid Profile: No results for input(s): CHOL, HDL, LDLCALC, TRIG, CHOLHDL, LDLDIRECT in the last 72 hours. Thyroid Function Tests: No results for input(s): TSH, T4TOTAL, FREET4, T3FREE, THYROIDAB in the last 72 hours. Anemia Panel: No results for input(s): VITAMINB12, FOLATE, FERRITIN, TIBC, IRON, RETICCTPCT in the last 72 hours. Urine analysis:    Component Value Date/Time   COLORURINE YELLOW 01/26/2015 0047   APPEARANCEUR CLEAR 01/26/2015 0047   LABSPEC 1.018 01/26/2015 0047   PHURINE 5.5 01/26/2015 [place] 01/26/2015 0047   HGBUR NEGATIVE 01/26/2015 0047   BILIRUBINUR NEGATIVE 01/26/2015 0047   KETONESUR NEGATIVE  01/26/2015 0047   PROTEINUR NEGATIVE 01/26/2015 0047   UROBILINOGEN 0.2 01/26/2015 0047   NITRITE NEGATIVE 01/26/2015 0047   LEUKOCYTESUR NEGATIVE 01/26/2015 0047   Sepsis Labs: '@LABRCNTIP'$ (procalcitonin:4,lacticidven:4)  ) Recent Results (from the past 240 hour(s))  Culture, body fluid-bottle     Status: None (Preliminary result)   Collection Time: 02/03/16  2:35 PM  Result Value Ref Range Status   Specimen Description PLEURAL LEFT  Final   Special Requests BOTTLES DRAWN AEROBIC AND ANAEROBIC 10 CC EACH  Final   Gram Stain   Final    Aerobic and anaerobic bottles recovered Gram Positive Cocci Gram Stain Report Called to,Read Back By and Verified With: Bivens,L at 0623 by H Flynt 02/04/16  Culture PENDING  Incomplete   Report Status PENDING  Incomplete  Gram stain     Status: None (Preliminary result)   Collection Time: 02/03/16  2:39 PM  Result Value Ref Range Status   Specimen Description PLEURAL LEFT  Final   Special Requests NONE  Final   Gram Stain   Final    CYTOSPIN SMEAR WBC PRESENT, PREDOMINANTLY MONONUCLEAR NO ORGANISMS SEEN Performed at [facility]    Report Status PENDING  Incomplete  Culture, blood (Routine X 2) w Reflex to ID Panel     Status: None (Preliminary result)   Collection Time: 02/03/16  4:45 PM  Result Value Ref Range Status   Specimen Description BLOOD RIGHT HAND  Final   Special Requests BOTTLES DRAWN AEROBIC ONLY 6CC  Final   Culture NO GROWTH < 24 HOURS  Final   Report Status PENDING  Incomplete  Culture, blood (Routine X 2) w Reflex to ID Panel     Status: None (Preliminary result)   Collection Time: 02/03/16  5:01 PM  Result Value Ref Range Status   Specimen Description BLOOD RIGHT HAND  Final   Special Requests BOTTLES DRAWN AEROBIC ONLY [place]  Final   Culture NO GROWTH < 24 HOURS  Final   Report Status PENDING  Incomplete         Radiology Studies: Dg Chest 1 View  Result Date: 02/03/2016 CLINICAL DATA:  Thoracentesis  EXAM: CHEST 1 VIEW COMPARISON:  Chest x-ray from earlier today FINDINGS: Decrease in left pleural effusion. No re-expansion edema or visible pneumothorax. Stable right pleural effusion with fissural loculation. There is a right-sided pleural catheter with tip along the mid medial thoracic cavity. Left subclavian porta catheter with tip at the lower SVC. No cardiomegaly. IMPRESSION: 1. No acute finding after left thoracentesis. 2. Loculated right pleural effusion with tunneled pleural catheter in place. Electronically Signed   By: Monte Fantasia M.D.   On: 02/03/2016 15:29   Dg Chest Port 1 View  Result Date: 02/03/2016 CLINICAL DATA:  Hypoxia EXAM: PORTABLE CHEST 1 VIEW COMPARISON:  January 30, 2016 FINDINGS: There is airspace consolidation throughout portions of the mid and lower lung zones bilaterally with evidence of left pleural effusion. Heart is mildly enlarged with pulmonary vascularity within normal limits. Port-A-Cath tip is in the superior vena cava. No pneumothorax. No bone lesions. There is atherosclerotic calcification in the aorta. IMPRESSION: Suspect multifocal pneumonia. There is increased opacity throughout the right mid and lower lung zone compared to most recent study with persistent consolidation in left effusion on the left without change. Stable cardiac silhouette. There is aortic atherosclerosis. Electronically Signed   By: Lowella Grip III M.D.   On: 02/03/2016 12:11        Scheduled Meds: . aspirin EC  81 mg Oral Daily  . atorvastatin  40 mg Oral q1800  . ceFEPime (MAXIPIME) IV  1 g Intravenous Q8H  . fluconazole  100 mg Oral Daily  . folic acid  1 mg Oral Daily  . furosemide  40 mg Intravenous Q12H  . gabapentin  100 mg Oral TID  . insulin aspart  0-9 Units Subcutaneous TID WC  . levothyroxine  25 mcg Oral QAC breakfast  . magnesium oxide  400 mg Oral TID  . meloxicam  7.5 mg Oral Daily  . metoprolol tartrate  12.5 mg Oral BID  . potassium chloride  10 mEq Oral  Daily  . sodium chloride flush  3 mL Intravenous Q12H  .  vancomycin  1,000 mg Intravenous Q8H  . warfarin  5 mg Oral Once  . [START ON 02/05/2016] Warfarin - Pharmacist Dosing Inpatient   Does not apply Q24H   Continuous Infusions:   LOS: 4 days    Time spent: 25 minutes. Greater than 50% of this time was spent in direct contact with the patient coordinating care.     Lelon Frohlich, MD [facility] Pager [phone]  If 7PM-7AM, please contact night-coverage [email or web address] Password [email or web address] 02/04/2016, 3:02 PM

## 2016-02-04 NOTE — Care Management Note (Signed)
Case Management Note  Patient Details  Name: Cameron Chandler MRN: [id number] Date of Birth: [birth year]  If discussed at [place] Length of Stay Meetings, dates discussed: 02/04/2016    Sherald Barge, RN 02/04/2016, 12:23 PM

## 2016-02-05 ENCOUNTER — Inpatient Hospital Stay (HOSPITAL_COMMUNITY): Payer: Medicare HMO

## 2016-02-05 DIAGNOSIS — C3491 Malignant neoplasm of unspecified part of right bronchus or lung: Principal | ICD-10-CM

## 2016-02-05 DIAGNOSIS — I5033 Acute on chronic diastolic (congestive) heart failure: Secondary | ICD-10-CM

## 2016-02-05 DIAGNOSIS — D72829 Elevated white blood cell count, unspecified: Secondary | ICD-10-CM

## 2016-02-05 DIAGNOSIS — J9601 Acute respiratory failure with hypoxia: Secondary | ICD-10-CM

## 2016-02-05 LAB — GLUCOSE, CAPILLARY
GLUCOSE-CAPILLARY: 124 mg/dL — AB (ref 65–99)
GLUCOSE-CAPILLARY: 119 mg/dL — AB (ref 65–99)
Glucose-Capillary: 126 mg/dL — ABNORMAL HIGH (ref 65–99)
Glucose-Capillary: 176 mg/dL — ABNORMAL HIGH (ref 65–99)

## 2016-02-05 LAB — PROTIME-INR
INR: 1.18
PROTHROMBIN TIME: 15.1 s (ref 11.4–15.2)

## 2016-02-05 LAB — BASIC METABOLIC PANEL
ANION GAP: 6 (ref 5–15)
BUN: 29 mg/dL — ABNORMAL HIGH (ref 6–20)
CALCIUM: 7.7 mg/dL — AB (ref 8.9–10.3)
CHLORIDE: 96 mmol/L — AB (ref 101–111)
CO2: 28 mmol/L (ref 22–32)
Creatinine, Ser: 1.06 mg/dL (ref 0.61–1.24)
GFR calc non Af Amer: >60 mL/min (ref >60)
Glucose, Bld: 104 mg/dL — ABNORMAL HIGH (ref 65–99)
Potassium: 4.1 mmol/L (ref 3.5–5.1)
Sodium: 130 mmol/L — ABNORMAL LOW (ref 135–145)

## 2016-02-05 LAB — CBC
HEMATOCRIT: 31.4 % — AB (ref 39.0–52.0)
HEMOGLOBIN: 10.2 g/dL — AB (ref 13.0–17.0)
MCH: 25.6 pg — ABNORMAL LOW (ref 26.0–34.0)
MCHC: 32.5 g/dL (ref 30.0–36.0)
MCV: 78.9 fL (ref 78.0–100.0)
Platelets: 238 10*3/uL (ref 150–400)
RBC: 3.98 MIL/uL — ABNORMAL LOW (ref 4.22–5.81)
RDW: 18.9 % — ABNORMAL HIGH (ref 11.5–15.5)
WBC: 28.8 10*3/uL — ABNORMAL HIGH (ref 4.0–10.5)

## 2016-02-05 MED ORDER — WARFARIN SODIUM 5 MG PO TABS
5.0000 mg | ORAL_TABLET | Freq: Once | ORAL | Status: AC
  Administered 2016-02-05: 5 mg via ORAL
  Filled 2016-02-05: qty 1

## 2016-02-05 NOTE — Progress Notes (Signed)
PROGRESS NOTE                                                                                                                                                                                                             Patient Demographics:    Cameron Chandler, is a 60 y.o. male, DOB - 21-May-1955, TTS:[id number]  Admit date - 01/30/2016   Admitting Physician Vianne Bulls, MD  Outpatient Primary MD for the patient is [name], MD  LOS - 5  Outpatient Specialists: Dr Julien Nordmann Dr Lisbeth Renshaw  Chief Complaint  Patient presents with  . Chest Pain       Brief Narrative  60 year old male with stage IV adenocarcinoma of the lung (currently on chemotherapy and radiation), chronic diastolic CHF, insulin-dependent diabetes mellitus, hypertension, CAD, left upper extremity DVT on Coumadin presented with a ED with progressive dyspnea for past 2 days. 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 Patient found to have recurrent right pleural effusion and left pleural effusion with acute on chronic diastolic CHF as well as healthcare associated pneumonia.      Subjective:   Patient informs his breathing to be at baseline, much improved after left thoracentesis done yesterday.   Assessment  & Plan :    Principal Problem: Acute hypoxic respiratory failure (HCC) Multifactorial with combination of bilateral pleural effusions, acute diastolic CHF, healthcare associated pneumonia and underlying lung cancer. CT chest was negative for PE but showed a small pneumothorax. Surgery consulted who recommended no need for chest tube placement. Right Pleurx drainage every other day. 2-D echo with EF of 65-70%). 1 diastolic dysfunction. Continue IV Lasix twice a day. Left sided thoracenteses done on 11/6 1800 mL bloody cloudy fluid removed.    Acute on chronic diastolic CHF (congestive heart failure) ([place]) Plan as outlined  above. IV Lasix. Strict I/O and daily weight. Improving.  Healthcare associated pneumonia On empiric vancomycin and cefepime. Cultures negative.   Active Problems: Leukocytosis Progressive for past 3 days. WBC of 29K today. Remains afebrile. Blood cultures have been  negative. Respiratory symptoms improved. Repeat chest x-ray today without any new infiltrate. Denies any diarrhea or urinary symptoms. Patient not on Decadron for past 3 weeks and also did not receive Neulasta after chemotherapy. We'll monitor for now.  Last Upper extremity DVT Continue Coumadin. Dosing per  pharmacy.  Hypokalemia and hypomagnesemia Replenished.     Adenocarcinoma of right lung, stage 4 ([place]) Getting chemotherapy and radiation as outpatient. Palliative care discussed goals of care with patient on 11/3.  wishes to be full code and wants to continue chemotherapy and radiation for now.     Protein-calorie malnutrition, severe Continue supplements.  Hypothyroidism Continue Synthroid.  Diabetes mellitus type 2 Monitor on sliding scale coverage.    1.    Code Status : Full code  Family Communication  : Discussed with daughter on the phone  Disposition Plan  : Home tomorrow if WBC improved and no signs of new infection.  Barriers For Discharge : Active symptoms  Consults  :  Palliative care  Procedures  :  Left thoracentesis 2-D echo  DVT Prophylaxis  :  Coumadin  Lab Results  Component Value Date   PLT 238 02/05/2016    Antibiotics  :    Anti-infectives    Start     Dose/Rate Route Frequency Ordered Stop   02/03/16 2300  vancomycin (VANCOCIN) IVPB 1000 mg/200 mL premix     1,000 mg 200 mL/hr over 60 Minutes Intravenous Every 8 hours 02/03/16 1413     02/03/16 1515  vancomycin (VANCOCIN) 1,500 mg in sodium chloride 0.9 % 500 mL IVPB     1,500 mg 250 mL/hr over 120 Minutes Intravenous  Once 02/03/16 1413 02/03/16 1948   02/03/16 1445  ceFEPIme (MAXIPIME) 1 g in dextrose 5 % 50  mL IVPB     1 g 100 mL/hr over 30 Minutes Intravenous Every 8 hours 02/03/16 1410     02/01/16 1200  fluconazole (DIFLUCAN) tablet 100 mg     100 mg Oral Daily 02/01/16 1155 02/08/16 0959        Objective:   Vitals:   02/04/16 1552 02/04/16 1938 02/04/16 2059 02/05/16 0500  BP: 101/76  99/79 94/69  Pulse: 92  97 96  Resp: '18  16 18  '$ Temp: 98.5 F (36.9 C)  98.4 F (36.9 C) 97.5 F (36.4 C)  TempSrc: Oral  Oral Oral  SpO2: 98% 96% 100% 95%  Weight:    88.8 kg (195 lb 12.3 oz)  Height:        Wt Readings from Last 3 Encounters:  02/05/16 88.8 kg (195 lb 12.3 oz)  01/28/16 95.7 kg (211 lb)  01/15/16 95.7 kg (211 lb)     Intake/Output Summary (Last 24 hours) at 02/05/16 1232 Last data filed at 02/05/16 0900  Gross per 24 hour  Intake             1230 ml  Output              650 ml  Net              580 ml     Physical Exam  Gen: not in distressAppears fatigued HEENT:  moist mucosa, supple neck Chest: Diminished bilateral breath sounds, right Pleurx catheter CVS: N S1&S2, no murmurs GI: soft, NT, ND,  Musculoskeletal: warm, no edema CNS: Oriented    Data Review:    CBC  Recent Labs Lab 02/01/16 0518 02/02/16 0702 02/03/16 0642 02/04/16 0614 02/05/16 0551  WBC 8.5 11.8* 17.5* 26.0* 28.8*  HGB 11.0* 11.0* 11.5* 11.3* 10.2*  HCT 33.4* 32.9* 35.3* 35.1* 31.4*  PLT 148* PLATELET CLUMPS NOTED ON SMEAR, COUNT APPEARS ADEQUATE 183 237 238  MCV 77.7* 77.6* 79.1 79.4 78.9  MCH 25.6* 25.9* 25.8* 25.6* 25.6*  MCHC  32.9 33.4 32.6 32.2 32.5  RDW 18.1* 18.2* 18.7* 18.8* 18.9*    Chemistries   Recent Labs Lab 01/30/16 2241 01/31/16 0312 02/01/16 0518 02/02/16 0702 02/03/16 0642 02/04/16 0614 02/05/16 0551  NA 133*  --  136 134* 135 132* 130*  K 3.3*  --  3.2* 2.9* 4.5 4.7 4.1  CL 96*  --  100* 98* 102 99* 96*  CO2 28  --  '27 28 26 27 28  '$ GLUCOSE 75  --  102* 104* 112* 109* 104*  BUN 23*  --  22* 24* 27* 26* 29*  CREATININE 0.83  --  0.69 0.81 0.77  0.91 1.06  CALCIUM 7.6*  --  7.7* 7.5* 7.9* 7.8* 7.7*  MG  --  1.6*  --  1.5*  --   --   --   AST 61*  --   --   --   --  62*  --   ALT 44  --   --   --   --  67*  --   ALKPHOS 72  --   --   --   --  98  --   BILITOT 0.2*  --   --   --   --  0.3  --    ------------------------------------------------------------------------------------------------------------------ No results for input(s): CHOL, HDL, LDLCALC, TRIG, CHOLHDL, LDLDIRECT in the last 72 hours.  Lab Results  Component Value Date   HGBA1C 5.7 (H) 12/19/2015   ------------------------------------------------------------------------------------------------------------------ No results for input(s): TSH, T4TOTAL, T3FREE, THYROIDAB in the last 72 hours.  Invalid input(s): FREET3 ------------------------------------------------------------------------------------------------------------------ No results for input(s): VITAMINB12, FOLATE, FERRITIN, TIBC, IRON, RETICCTPCT in the last 72 hours.  Coagulation profile  Recent Labs Lab 02/01/16 0518 02/02/16 0702 02/03/16 0642 02/04/16 0614 02/05/16 0551  INR 4.45* 3.50 2.51 1.21 1.18    No results for input(s): DDIMER in the last 72 hours.  Cardiac Enzymes  Recent Labs Lab 01/31/16 0312 01/31/16 0803 01/31/16 1440  TROPONINI 0.03* 0.03* 0.04*   ------------------------------------------------------------------------------------------------------------------    Component Value Date/Time   BNP 174.0 (H) 01/31/2016 0312    Inpatient Medications  Scheduled Meds: . aspirin EC  81 mg Oral Daily  . atorvastatin  40 mg Oral q1800  . ceFEPime (MAXIPIME) IV  1 g Intravenous Q8H  . feeding supplement (ENSURE ENLIVE)  237 mL Oral BID BM  . fluconazole  100 mg Oral Daily  . folic acid  1 mg Oral Daily  . furosemide  40 mg Intravenous Q12H  . gabapentin  100 mg Oral TID  . insulin aspart  0-9 Units Subcutaneous TID WC  . levothyroxine  25 mcg Oral QAC breakfast  .  magnesium oxide  400 mg Oral TID  . meloxicam  7.5 mg Oral Daily  . metoprolol tartrate  12.5 mg Oral BID  . potassium chloride  10 mEq Oral Daily  . sodium chloride flush  3 mL Intravenous Q12H  . vancomycin  1,000 mg Intravenous Q8H  . warfarin  5 mg Oral Once  . Warfarin - Pharmacist Dosing Inpatient   Does not apply Q24H   Continuous Infusions: PRN Meds:.sodium chloride, acetaminophen, HYDROmorphone (DILAUDID) injection, ondansetron (ZOFRAN) IV, sodium chloride flush  Micro Results Recent Results (from the past 240 hour(s))  Culture, body fluid-bottle     Status: Abnormal (Preliminary result)   Collection Time: 02/03/16  2:35 PM  Result Value Ref Range Status   Specimen Description PLEURAL LEFT  Final   Special Requests BOTTLES DRAWN AEROBIC AND ANAEROBIC  10 CC EACH  Final   Gram Stain   Final    Aerobic and anaerobic bottles recovered Gram Positive Cocci Gram Stain Report Called to,Read Back By and Verified With: Bivens,L at 0850 by H Flynt 02/04/16    Culture (A)  Final    STAPHYLOCOCCUS AUREUS SUSCEPTIBILITIES TO FOLLOW Performed at [facility]    Report Status PENDING  Incomplete  Gram stain     Status: None   Collection Time: 02/03/16  2:39 PM  Result Value Ref Range Status   Specimen Description PLEURAL LEFT  Final   Special Requests NONE  Final   Gram Stain   Final    CYTOSPIN SMEAR WBC PRESENT, PREDOMINANTLY MONONUCLEAR NO ORGANISMS SEEN Performed at [facility]    Report Status 02/04/2016 FINAL  Final  Culture, blood (Routine X 2) w Reflex to ID Panel     Status: None (Preliminary result)   Collection Time: 02/03/16  4:45 PM  Result Value Ref Range Status   Specimen Description BLOOD RIGHT HAND  Final   Special Requests BOTTLES DRAWN AEROBIC ONLY 6CC  Final   Culture NO GROWTH 2 DAYS  Final   Report Status PENDING  Incomplete  Culture, blood (Routine X 2) w Reflex to ID Panel     Status: None (Preliminary result)   Collection Time:  02/03/16  5:01 PM  Result Value Ref Range Status   Specimen Description BLOOD RIGHT HAND  Final   Special Requests BOTTLES DRAWN AEROBIC ONLY [place]  Final   Culture NO GROWTH 2 DAYS  Final   Report Status PENDING  Incomplete    Radiology Reports Dg Chest 1 View  Result Date: 02/03/2016 CLINICAL DATA:  Thoracentesis EXAM: CHEST 1 VIEW COMPARISON:  Chest x-ray from earlier today FINDINGS: Decrease in left pleural effusion. No re-expansion edema or visible pneumothorax. Stable right pleural effusion with fissural loculation. There is a right-sided pleural catheter with tip along the mid medial thoracic cavity. Left subclavian porta catheter with tip at the lower SVC. No cardiomegaly. IMPRESSION: 1. No acute finding after left thoracentesis. 2. Loculated right pleural effusion with tunneled pleural catheter in place. Electronically Signed   By: Monte Fantasia M.D.   On: 02/03/2016 15:29   Dg Chest 2 View  Result Date: 02/05/2016 CLINICAL DATA:  Shortness of breath, elevated white blood cell count. Stage 4right-sided lung malignancy. History of coronary artery disease with stent placement. Current smoker. EXAM: CHEST  2 VIEW COMPARISON:  Portable chest x-ray of February 03, 2016 FINDINGS: The lungs are better inflated today. The volume of pleural fluid on the right is fairly stable. The interstitial markings in the mid and lower lungs have improved. The heart is normal in size. The pulmonary vascularity is not engorged. There is calcification in the wall of the aortic arch. The Port-A-Cath tip projects over the midportion of the SVC. The right-sided chest tube tip projects over the medial aspect of the upper hemi thorax at approximately the T5-6 level. IMPRESSION: Improved aeration of both lung bases consistent with resolving atelectasis or pneumonia. Persistent moderate-sized right pleural effusion with trace left pleural effusion. No pneumothorax. Electronically Signed   By: David  Martinique M.D.   On:  02/05/2016 12:14   Dg Chest 2 View  Result Date: 01/30/2016 CLINICAL DATA:  Initial evaluation for acute left-sided chest pain, shortness of breath. EXAM: CHEST  2 VIEW COMPARISON:  Prior radiograph from 01/28/2016. FINDINGS: Left-sided Port-A-Cath in stable position. Cardiac and mediastinal silhouettes are unchanged,  and remain within normal limits. Moderate bilateral pleural effusions. Associated bibasilar opacities likely atelectasis, although infiltrates are not excluded. Diffuse pulmonary vascular congestion with prominence of the interstitial markings, suggesting mild superimposed edema. No pneumothorax. No acute osseous abnormality. IMPRESSION: 1. Moderate bilateral pleural effusions with associated bibasilar opacities. Atelectasis/edema is favored, although infiltrates could be considered in the correct clinical setting. 2. Diffuse vascular congestion with interstitial prominence, suggesting superimposed mild edema. Electronically Signed   By: Jeannine Boga M.D.   On: 01/30/2016 23:06   Dg Chest 2 View  Result Date: 01/28/2016 CLINICAL DATA:  History of lung cancer. Right chest tube inserted January 09, 2016. No shortness of breath today. Congestion and hoarseness. EXAM: CHEST  2 VIEW COMPARISON:  January 08, 2016 FINDINGS: There is a right-sided chest tube. The right-sided pleural effusion is much smaller in the interval. A moderate left-sided effusion is stable. The heart, hila, and mediastinum are unchanged. No pneumothorax. The left Port-A-Cath has been placed terminating in the mid SVC. Mild increased interstitial markings centrally suggest pulmonary venous congestion/ mild edema. No other interval changes. IMPRESSION: 1. The patient has a left-sided Port-A-Cath and a right-sided chest tube. The right-sided pleural effusion is much smaller in the interval. The moderate left effusion is stable. 2. Interval development of mild edema. Electronically Signed   By: Dorise Bullion III M.D    On: 01/28/2016 11:26   Dg Chest 2 View  Result Date: 01/08/2016 CLINICAL DATA:  Pleural effusion EXAM: CHEST  2 VIEW COMPARISON:  December 31, 2015 chest radiograph and chest CT December 22, 2015 FINDINGS: There is vague opacity in the right upper lobe in the area of lung mass well seen on recent CT. There are bilateral pleural effusions bilaterally, slightly larger on the left than on the right with patchy airspace consolidation in the left base. No new opacity is evident compared to most recent chest radiograph examination. The heart size and pulmonary vascularity are normal. There is atherosclerotic calcification in the aorta. No adenopathy. No evident pneumothorax. There is degenerative change in the thoracic spine. IMPRESSION: Vague opacity in the medial right upper lobe in the area of mass seen on recent CT. Bilateral pleural effusions, slightly larger on the left than on the right. Patchy airspace consolidation left base, stable. No new opacity. No change in cardiac silhouette. There is aortic atherosclerosis. Electronically Signed   By: Lowella Grip III M.D.   On: 01/08/2016 14:07   Ct Angio Chest Pe W/cm &/or Wo Cm  Result Date: 01/31/2016 CLINICAL DATA:  Bilateral chest pain for 2 days with dyspnea. EXAM: CT ANGIOGRAPHY CHEST WITH CONTRAST TECHNIQUE: Multidetector CT imaging of the chest was performed using the standard protocol during bolus administration of intravenous contrast. Multiplanar CT image reconstructions and MIPs were obtained to evaluate the vascular anatomy. CONTRAST:  100 mL Isovue 370 intravenous COMPARISON:  12/22/2015 FINDINGS: Cardiovascular: Satisfactory opacification of the pulmonary arteries to the segmental level. No evidence of pulmonary embolism. Normal heart size. No pericardial effusion. Normal heart size. Minimal pericardial effusion, unchanged. Mediastinum/Nodes: Unchanged hilar adenopathy bilaterally. The 14 mm short axis right hilar node is unchanged, visible on  image 44 series 4. A 14 mm left hilar node is also unchanged, visible on image 42 series 4. Lungs/Pleura: Moderate left pleural effusion may be slightly larger than on 12/22/2015. There is significant size reduction of the right pleural effusion, with a small volume of partially loculated pleural fluid remaining despite presence of a right-sided pleural drain. There also is  a very small right pneumothorax. The right upper lobe lung mass measures 2.4 x 3.8 cm and previously measured 2.3 x 5.3 cm. The right upper lobe pulmonary nodule measures 8 mm and previously measured 10 mm. The 8 mm left upper lobe nodule is not visible today. Possible 2.6 cm central right lower lobe mass on image 62 series 6 was not previously visible due to marked lower lobe atelectasis from the surrounding pleural fluid. It is possible that this instead represents loculated fluid in the right major fissure; there is extensive thickening and fluid in the fissures of the right lung. Upper Abdomen: No conclusive hepatic or adrenal metastases. Small volume upper abdominal ascites. Musculoskeletal: Lytic/sclerotic metastases at T1 and T2 are again evident without significant interval change. No new skeletal lesions are evident. Review of the MIP images confirms the above findings. IMPRESSION: 1. Negative for pulmonary embolism. 2. Reduction in right pleural effusion with interim placement of a right pleural drain. There is a small residual right pleural collection which may be loculated. There also is a very small right pneumothorax which may relate to the pleural drain. 3. Decreased size of the right upper lobe mass. Possible central right lower lobe mass which was not previously visible but now that portion of the right lung is better expanded due to reduction of pleural fluid volume; it is possible that this instead represents a loculated collection of fluid in the major fissure. 4. Unchanged hilar adenopathy bilaterally. 5. Unchanged skeletal  metastases. Electronically Signed   By: Andreas Newport M.D.   On: 01/31/2016 00:35   Dg Chest Port 1 View  Result Date: 02/03/2016 CLINICAL DATA:  Hypoxia EXAM: PORTABLE CHEST 1 VIEW COMPARISON:  January 30, 2016 FINDINGS: There is airspace consolidation throughout portions of the mid and lower lung zones bilaterally with evidence of left pleural effusion. Heart is mildly enlarged with pulmonary vascularity within normal limits. Port-A-Cath tip is in the superior vena cava. No pneumothorax. No bone lesions. There is atherosclerotic calcification in the aorta. IMPRESSION: Suspect multifocal pneumonia. There is increased opacity throughout the right mid and lower lung zone compared to most recent study with persistent consolidation in left effusion on the left without change. Stable cardiac silhouette. There is aortic atherosclerosis. Electronically Signed   By: Lowella Grip III M.D.   On: 02/03/2016 12:11   Dg Fluoro Guide Cv Line-no Report  Result Date: 01/09/2016 CLINICAL DATA:  FLOURO GUIDE CV LINE Fluoroscopy was utilized by the requesting physician.  No radiographic interpretation.   US Thoracentesis Asp Pleural Space W/img Guide  Result Date: 02/05/2016 INDICATION: Bilateral pleural effusions. EXAM: ULTRASOUND GUIDED LEFT THORACENTESIS MEDICATIONS: None. COMPLICATIONS: None immediate. PROCEDURE: An ultrasound guided thoracentesis was thoroughly discussed with the patient and questions answered. The benefits, risks, alternatives and complications were also discussed. The patient understands and wishes to proceed with the procedure. Written consent was obtained. Ultrasound was performed to localize and mark an adequate pocket of fluid in the left chest. The area was then prepped and draped in the normal sterile fashion. 1% Lidocaine was used for local anesthesia. Under ultrasound guidance a Yueh catheter was introduced. Thoracentesis was performed. The catheter was removed and a dressing  applied. FINDINGS: A total of approximately 1,800 of grossly bloody cloudy fluid was removed. Samples were sent to the laboratory as requested by the clinical team. IMPRESSION: Successful ultrasound guided left thoracentesis yielding 1.8 L of pleural fluid. Electronically Signed   By: Lorriane Shire M.D.   On: 02/05/2016 09:26  Time Spent in minutes  35   Louellen Molder M.D on 02/05/2016 at 12:32 PM  Between 7am to 7pm - Pager - [phone]  After 7pm go to [email or web address] - password [facility]  [facility] -  Office  [phone]

## 2016-02-05 NOTE — Progress Notes (Signed)
[facility] for Warfarin Indication: VTE Treatment  Allergies  Allergen Reactions  . No Known Allergies    Patient Measurements: Height: '5\' 6"'$  (167.6 cm) Weight: 195 lb 12.3 oz (88.8 kg) IBW/kg (Calculated) : 63.8  Vital Signs: Temp: 97.5 F (36.4 C) (11/08 0500) Temp Source: Oral (11/08 0500) BP: 94/69 (11/08 0500) Pulse Rate: 96 (11/08 0500)  Labs:  Recent Labs  02/03/16 0642 02/04/16 0614 02/05/16 0551  HGB 11.5* 11.3* 10.2*  HCT 35.3* 35.1* 31.4*  PLT 183 237 238  LABPROT 27.6* 15.4* 15.1  INR 2.51 1.21 1.18  CREATININE 0.77 0.91 1.06   Estimated Creatinine Clearance: 75.4 mL/min (by C-G formula based on SCr of 1.06 mg/dL).  Medical History: Past Medical History:  Diagnosis Date  . Adenocarcinoma of right lung, stage 4 ([place]) 12/03/2015  . Arthritis    back, joint pain  . Bone cancer ([place])    T 1T2  . Cancer [facility]) 2011   colon cancer  . Colon cancer [facility])    Colon resection and Chemotherapy  . Coronary atherosclerosis of native coronary artery   . Diabetes mellitus without complication ([place])    takes Meformin and Levemir daily  . DVT (deep venous thrombosis) ([facility])    Left arm  . Dyspnea    lying,sitting,exertion  . Heart attack   . Hypothyroidism    takes Synthroid daily  . Myocardial infarction 2007  . Nocturia   . Other and unspecified hyperlipidemia    takes Simvastatin daily  . Prostate cancer ([place]) 12/13/12   ,Radiation  . Seizures ([place])    hx of-had one over 20 yrs ago  . Tingling    hands  . Unspecified essential hypertension    takes Diovan and Metoprolol daily  . Weakness    in legs   Medications:  Prescriptions Prior to Admission  Medication Sig Dispense Refill Last Dose  . aspirin EC 81 MG tablet Take 81 mg by mouth daily.   01/28/2016 at Unknown time  . dexamethasone (DECADRON) 4 MG tablet 4 mg by mouth twice a day the day before, day of and day after the chemotherapy every 3 weeks (Patient  taking differently: Take 4 mg by mouth 2 (two) times daily. 4 mg by mouth twice a day the day before, day of and day after the chemotherapy every 3 weeks) 40 tablet 1 01/13/2016 at Unknown time  . Fenofibric Acid 105 MG TABS Take 1 tablet by mouth daily.   16/09/3708  . folic acid (FOLVITE) 1 MG tablet Take 1 tablet (1 mg total) by mouth daily. 30 tablet 4 01/28/2016 at Unknown time  . furosemide (LASIX) 40 MG tablet Take 1 tablet (40 mg total) by mouth daily. 14 tablet 1 01/28/2016 at Unknown time  . gabapentin (NEURONTIN) 100 MG capsule Take 1 capsule (100 mg total) by mouth 3 (three) times daily. (Patient taking differently: Take 300 mg by mouth 3 (three) times daily. ) 90 capsule 0 01/28/2016 at Unknown time  . hydrochlorothiazide (HYDRODIURIL) 25 MG tablet Take 25 mg by mouth daily.   01/28/2016 at Unknown time  . HYDROcodone-acetaminophen (NORCO) 7.5-325 MG tablet Take 1 tablet by mouth 3 (three) times daily. 30 tablet 0 01/30/2016 at Unknown time  . levothyroxine (SYNTHROID, LEVOTHROID) 25 MCG tablet Take 1 tablet (25 mcg total) by mouth daily before breakfast. 30 tablet 11 01/28/2016 at Unknown time  . lidocaine-prilocaine (EMLA) cream Apply 1 application topically as needed. (Patient taking differently: Apply 1  application topically as needed (for port access). ) 30 g 0 unknown  . meloxicam (MOBIC) 7.5 MG tablet Take 7.5 mg by mouth daily.   01/28/2016 at Unknown time  . metFORMIN (GLUMETZA) 500 MG (MOD) 24 hr tablet Take 1,000 mg by mouth 2 (two) times daily with a meal.   01/28/2016  . metoprolol tartrate (LOPRESSOR) 25 MG tablet Take 12.5 mg by mouth 2 (two) times daily.   01/28/2016 at Unknown time  . potassium chloride (K-DUR,KLOR-CON) 10 MEQ tablet Take 1 tablet (10 mEq total) by mouth daily. 14 tablet 1 01/28/2016 at Unknown time  . prochlorperazine (COMPAZINE) 10 MG tablet Take 1 tablet (10 mg total) by mouth every 6 (six) hours as needed for nausea or vomiting. 30 tablet 0 unknown  .  simvastatin (ZOCOR) 80 MG tablet TAKE 1 TABLET BY MOUTH DAILY AT 6 P.M. 30 tablet 3 01/28/2016 at Unknown time  . valsartan (DIOVAN) 160 MG tablet Take 160 mg by mouth daily.   01/28/2016  . warfarin (COUMADIN) 5 MG tablet Take 5 mg by mouth daily.   1 01/28/2016 at Unknown time  . Wound Dressings (SONAFINE) Apply 1 application topically daily.   01/28/2016 at Unknown time  . insulin detemir (LEVEMIR) 100 unit/ml SOLN Inject 30 Units into the skin at bedtime.   over 30 days  . nitroGLYCERIN (NITROSTAT) 0.4 MG SL tablet Place 0.4 mg under the tongue every 5 (five) minutes as needed for chest pain.   unknown   Assessment: 60 yo male taking warfarin PTA for DVT in left extremity. INR elevated on admission but has trended down after holding Coumadin and giving Vit. K.  S/P thoracentesis 11/6; 1651m red colored pleural fluid drained.  Warfarin restarted on 11/7.  Pt also on Fluconazole which may interact with warfarin to increase INR.  Goal of Therapy:  INR 2-3 Monitor platelets by anticoagulation protocol: Yes   Plan:  Warfarin '5mg'$  PO x 1 today INR/PT daily Monitor CBC, platelets, signs of bleeding  [place]ane Rexine Gowens A 02/05/2016,11:19 AM

## 2016-02-05 NOTE — Progress Notes (Signed)
SATURATION QUALIFICATIONS: (This note is used to comply with regulatory documentation for home oxygen)  Patient Saturations on Room Air at Rest = 100%  Patient Saturations on Room Air while Ambulating = 96%   

## 2016-02-06 DIAGNOSIS — A4902 Methicillin resistant Staphylococcus aureus infection, unspecified site: Secondary | ICD-10-CM

## 2016-02-06 DIAGNOSIS — J90 Pleural effusion, not elsewhere classified: Secondary | ICD-10-CM

## 2016-02-06 LAB — CULTURE, BODY FLUID-BOTTLE

## 2016-02-06 LAB — CBC WITH DIFFERENTIAL/PLATELET
BLASTS: 0 %
Band Neutrophils: 17 %
Basophils Absolute: 0 10*3/uL (ref 0.0–0.1)
Basophils Relative: 0 %
EOS PCT: 0 %
Eosinophils Absolute: 0 10*3/uL (ref 0.0–0.7)
HEMATOCRIT: 31.8 % — AB (ref 39.0–52.0)
HEMOGLOBIN: 10.5 g/dL — AB (ref 13.0–17.0)
Lymphocytes Relative: 14 %
Lymphs Abs: 5 10*3/uL — ABNORMAL HIGH (ref 0.7–4.0)
MCH: 25.6 pg — AB (ref 26.0–34.0)
MCHC: 33 g/dL (ref 30.0–36.0)
MCV: 77.6 fL — AB (ref 78.0–100.0)
MONO ABS: 0.7 10*3/uL (ref 0.1–1.0)
MYELOCYTES: 0 %
Metamyelocytes Relative: 0 %
Monocytes Relative: 2 %
NEUTROS PCT: 67 %
NRBC: 0 /100{WBCs}
Neutro Abs: 30.3 10*3/uL — ABNORMAL HIGH (ref 1.7–7.7)
Platelets: 281 10*3/uL (ref 150–400)
Promyelocytes Absolute: 0 %
RBC: 4.1 MIL/uL — AB (ref 4.22–5.81)
RDW: 18.7 % — ABNORMAL HIGH (ref 11.5–15.5)
WBC MORPHOLOGY: INCREASED
WBC: 36 10*3/uL — AB (ref 4.0–10.5)

## 2016-02-06 LAB — CULTURE, BODY FLUID W GRAM STAIN -BOTTLE

## 2016-02-06 LAB — GLUCOSE, CAPILLARY
GLUCOSE-CAPILLARY: 166 mg/dL — AB (ref 65–99)
GLUCOSE-CAPILLARY: 139 mg/dL — AB (ref 65–99)
Glucose-Capillary: 108 mg/dL — ABNORMAL HIGH (ref 65–99)
Glucose-Capillary: 129 mg/dL — ABNORMAL HIGH (ref 65–99)

## 2016-02-06 LAB — PROTIME-INR
INR: 1.14
Prothrombin Time: 14.6 seconds (ref 11.4–15.2)

## 2016-02-06 LAB — VANCOMYCIN, TROUGH
VANCOMYCIN TR: 50 ug/mL — AB (ref 15–20)
VANCOMYCIN TR: 35 ug/mL — AB (ref 15–20)

## 2016-02-06 MED ORDER — ENOXAPARIN SODIUM 150 MG/ML ~~LOC~~ SOLN
130.0000 mg | SUBCUTANEOUS | Status: DC
  Administered 2016-02-06 – 2016-02-07 (×2): 130 mg via SUBCUTANEOUS
  Filled 2016-02-06 (×4): qty 0.87

## 2016-02-06 MED ORDER — SODIUM CHLORIDE 0.9% FLUSH
10.0000 mL | Freq: Two times a day (BID) | INTRAVENOUS | Status: DC
  Administered 2016-02-06: 10 mL
  Administered 2016-02-06 – 2016-02-07 (×3): 20 mL

## 2016-02-06 MED ORDER — SODIUM CHLORIDE 0.9% FLUSH
10.0000 mL | INTRAVENOUS | Status: DC | PRN
  Administered 2016-02-06: 20 mL
  Administered 2016-02-06 – 2016-02-07 (×3): 10 mL
  Filled 2016-02-06 (×3): qty 40

## 2016-02-06 MED ORDER — HEPARIN SOD (PORK) LOCK FLUSH 100 UNIT/ML IV SOLN: 500.0000 [IU] | INTRAVENOUS | Status: DC | PRN

## 2016-02-06 MED ORDER — WARFARIN SODIUM 5 MG PO TABS
5.0000 mg | ORAL_TABLET | Freq: Once | ORAL | Status: AC
  Administered 2016-02-06: 5 mg via ORAL
  Filled 2016-02-06: qty 1

## 2016-02-06 NOTE — Progress Notes (Signed)
SLP Cancellation Note  Patient Details Name: Cameron Chandler MRN: [id number] DOB: 1955/11/05   Cancelled treatment:       Reason Eval/Treat Not Completed: Other (comment); SLP will plan to sign off for now. If MD desires objective swallow assessment, please order MBSS.  Thank you,  Genene Churn, [place]    [place] 02/06/2016, 7:54 PM

## 2016-02-06 NOTE — Progress Notes (Signed)
PROGRESS NOTE                                                                                                                                                                                                             Patient Demographics:    Cameron Chandler, is a 60 y.o. male, DOB - 02/17/1956, EHU:[id number]  Admit date - 01/30/2016   Admitting Physician Vianne Bulls, MD  Outpatient Primary MD for the patient is [name], MD  LOS - 6  Outpatient Specialists: Dr Julien Nordmann Dr Lisbeth Renshaw  Chief Complaint  Patient presents with  . Chest Pain       Brief Narrative  60 year old male with stage IV adenocarcinoma of the lung (currently on chemotherapy and radiation), chronic diastolic CHF, insulin-dependent diabetes mellitus, hypertension, CAD, left upper extremity DVT on Coumadin presented with a ED with progressive dyspnea for past 2 days. 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 Patient found to have recurrent right pleural effusion and left pleural effusion with acute on chronic diastolic CHF as well as healthcare associated pneumonia.      Subjective:   Denies any breathing discomfort   Assessment  & Plan :    Principal Problem: Acute hypoxic respiratory failure (HCC) Multifactorial with combination of bilateral pleural effusions, acute diastolic CHF, healthcare associated pneumonia and underlying lung cancer. CT chest was negative for PE but showed a small pneumothorax. Surgery consulted who recommended no need for chest tube placement. Right Pleurx drainage every other day. 2-D echo with EF of 65-70%). 1 diastolic dysfunction. Continue IV Lasix twice a day. Left sided thoracenteses done on 11/6 1800 mL bloody cloudy fluid removed. cx growing MRSA.    Acute on chronic diastolic CHF (congestive heart failure) ([place]) Plan as outlined above. IV Lasix. Strict I/O and daily weight.  Improving.  Healthcare associated pneumonia and progressive leucocytosis On empiric vancomycin and cefepime. Vancomycin held due to high trough. blood Cultures negative.  Left pleural fluid cx growing MRSA. discussed findings and progressive leucocytosis with Dr Megan Salon on the phone. Patient not on steroids or received neupogen recently. Recommends 2 weeks of IV vancomycin.  will order PICC line.   Active Problems:   Last Upper extremity DVT Continue Coumadin. Dosing per pharmacy.  Hypokalemia and hypomagnesemia Replenished.     Adenocarcinoma of right lung, stage  4 ([place]) Getting chemotherapy and radiation as outpatient. Palliative care discussed goals of care with patient on 11/3.  wishes to be full code and wants to continue chemotherapy and radiation for now.     Protein-calorie malnutrition, severe Continue supplements.  Hypothyroidism Continue Synthroid.  Diabetes mellitus type 2 Monitor on sliding scale coverage.    1.    Code Status : Full code  Family Communication  : Discussed with daughter on the phone  Disposition Plan  : Home tomorrow if WBC improved  Barriers For Discharge : Active symptoms  Consults  :  Palliative care  Procedures  :  Left thoracentesis 2-D echo  DVT Prophylaxis  :  Coumadin  Lab Results  Component Value Date   PLT 281 02/06/2016    Antibiotics  :    Anti-infectives    Start     Dose/Rate Route Frequency Ordered Stop   02/03/16 2300  vancomycin (VANCOCIN) IVPB 1000 mg/200 mL premix  Status:  Discontinued     1,000 mg 200 mL/hr over 60 Minutes Intravenous Every 8 hours 02/03/16 1413 02/06/16 0736   02/03/16 1515  vancomycin (VANCOCIN) 1,500 mg in sodium chloride 0.9 % 500 mL IVPB     1,500 mg 250 mL/hr over 120 Minutes Intravenous  Once 02/03/16 1413 02/03/16 1948   02/03/16 1445  ceFEPIme (MAXIPIME) 1 g in dextrose 5 % 50 mL IVPB     1 g 100 mL/hr over 30 Minutes Intravenous Every 8 hours 02/03/16 1410     02/01/16  1200  fluconazole (DIFLUCAN) tablet 100 mg     100 mg Oral Daily 02/01/16 1155 02/08/16 0959        Objective:   Vitals:   02/05/16 1440 02/05/16 1932 02/05/16 2051 02/06/16 0445  BP: 99/67  94/67 115/83  Pulse: 94  98 (!) 104  Resp: '18  16 18  '$ Temp: 98.1 F (36.7 C)  98.1 F (36.7 C) 98.5 F (36.9 C)  TempSrc: Oral  Oral Oral  SpO2: 96% (!) 88% 99% 100%  Weight:    84.6 kg (186 lb 9.6 oz)  Height:        Wt Readings from Last 3 Encounters:  02/06/16 84.6 kg (186 lb 9.6 oz)  01/28/16 95.7 kg (211 lb)  01/15/16 95.7 kg (211 lb)     Intake/Output Summary (Last 24 hours) at 02/06/16 1252 Last data filed at 02/06/16 0923  Gross per 24 hour  Intake             1160 ml  Output             1325 ml  Net             -165 ml     Physical Exam  Gen: not in distress, Appears fatigued HEENT:  moist mucosa, supple neck Chest: Diminished bilateral breath sounds, right Pleurx catheter, port A cath CVS: N S1&S2, no murmurs GI: soft, NT, ND,  Musculoskeletal: warm, no edema     Data Review:    CBC  Recent Labs Lab 02/02/16 0702 02/03/16 0642 02/04/16 0614 02/05/16 0551 02/06/16 0607  WBC 11.8* 17.5* 26.0* 28.8* 36.0*  HGB 11.0* 11.5* 11.3* 10.2* 10.5*  HCT 32.9* 35.3* 35.1* 31.4* 31.8*  PLT PLATELET CLUMPS NOTED ON SMEAR, COUNT APPEARS ADEQUATE 183 237 238 281  MCV 77.6* 79.1 79.4 78.9 77.6*  MCH 25.9* 25.8* 25.6* 25.6* 25.6*  MCHC 33.4 32.6 32.2 32.5 33.0  RDW 18.2* 18.7* 18.8* 18.9* 18.7*  LYMPHSABS  --   --   --   --  5.0*  MONOABS  --   --   --   --  0.7  EOSABS  --   --   --   --  0.0  BASOSABS  --   --   --   --  0.0    Chemistries   Recent Labs Lab 01/30/16 2241 01/31/16 0312 02/01/16 0518 02/02/16 0702 02/03/16 0642 02/04/16 0614 02/05/16 0551  NA 133*  --  136 134* 135 132* 130*  K 3.3*  --  3.2* 2.9* 4.5 4.7 4.1  CL 96*  --  100* 98* 102 99* 96*  CO2 28  --  '27 28 26 27 28  '$ GLUCOSE 75  --  102* 104* 112* 109* 104*  BUN 23*  --  22*  24* 27* 26* 29*  CREATININE 0.83  --  0.69 0.81 0.77 0.91 1.06  CALCIUM 7.6*  --  7.7* 7.5* 7.9* 7.8* 7.7*  MG  --  1.6*  --  1.5*  --   --   --   AST 61*  --   --   --   --  62*  --   ALT 44  --   --   --   --  67*  --   ALKPHOS 72  --   --   --   --  98  --   BILITOT 0.2*  --   --   --   --  0.3  --    ------------------------------------------------------------------------------------------------------------------ No results for input(s): CHOL, HDL, LDLCALC, TRIG, CHOLHDL, LDLDIRECT in the last 72 hours.  Lab Results  Component Value Date   HGBA1C 5.7 (H) 12/19/2015   ------------------------------------------------------------------------------------------------------------------ No results for input(s): TSH, T4TOTAL, T3FREE, THYROIDAB in the last 72 hours.  Invalid input(s): FREET3 ------------------------------------------------------------------------------------------------------------------ No results for input(s): VITAMINB12, FOLATE, FERRITIN, TIBC, IRON, RETICCTPCT in the last 72 hours.  Coagulation profile  Recent Labs Lab 02/02/16 0702 02/03/16 0642 02/04/16 0614 02/05/16 0551 02/06/16 0607  INR 3.50 2.51 1.21 1.18 1.14    No results for input(s): DDIMER in the last 72 hours.  Cardiac Enzymes  Recent Labs Lab 01/31/16 0312 01/31/16 0803 01/31/16 1440  TROPONINI 0.03* 0.03* 0.04*   ------------------------------------------------------------------------------------------------------------------    Component Value Date/Time   BNP 174.0 (H) 01/31/2016 0312    Inpatient Medications  Scheduled Meds: . aspirin EC  81 mg Oral Daily  . atorvastatin  40 mg Oral q1800  . ceFEPime (MAXIPIME) IV  1 g Intravenous Q8H  . feeding supplement (ENSURE ENLIVE)  237 mL Oral BID BM  . fluconazole  100 mg Oral Daily  . folic acid  1 mg Oral Daily  . furosemide  40 mg Intravenous Q12H  . gabapentin  100 mg Oral TID  . insulin aspart  0-9 Units Subcutaneous TID WC   . levothyroxine  25 mcg Oral QAC breakfast  . magnesium oxide  400 mg Oral TID  . meloxicam  7.5 mg Oral Daily  . metoprolol tartrate  12.5 mg Oral BID  . potassium chloride  10 mEq Oral Daily  . sodium chloride flush  10-40 mL Intracatheter Q12H  . sodium chloride flush  3 mL Intravenous Q12H  . warfarin  5 mg Oral Once  . Warfarin - Pharmacist Dosing Inpatient   Does not apply Q24H   Continuous Infusions: PRN Meds:.sodium chloride, acetaminophen, heparin lock flush, HYDROmorphone (DILAUDID) injection, ondansetron (ZOFRAN) IV, sodium chloride flush, sodium chloride flush  Micro Results Recent Results (from the past 240 hour(s))  Culture, body fluid-bottle     Status: Abnormal   Collection Time: 02/03/16  2:35 PM  Result Value Ref Range Status   Specimen Description PLEURAL LEFT  Final   Special Requests BOTTLES DRAWN AEROBIC AND ANAEROBIC 10 CC EACH  Final   Gram Stain   Final    Aerobic and anaerobic bottles recovered Gram Positive Cocci Gram Stain Report Called to,Read Back By and Verified With: Bivens,L at 0850 by H Flynt 02/04/16    Culture METHICILLIN RESISTANT STAPHYLOCOCCUS AUREUS (A)  Final   Report Status 02/06/2016 FINAL  Final   Organism ID, Bacteria METHICILLIN RESISTANT STAPHYLOCOCCUS AUREUS  Final      Susceptibility   Methicillin resistant staphylococcus aureus - MIC*    CIPROFLOXACIN >=8 RESISTANT Resistant     ERYTHROMYCIN >=8 RESISTANT Resistant     GENTAMICIN <=0.5 SENSITIVE Sensitive     OXACILLIN >=4 RESISTANT Resistant     TETRACYCLINE <=1 SENSITIVE Sensitive     VANCOMYCIN <=0.5 SENSITIVE Sensitive     TRIMETH/SULFA <=10 SENSITIVE Sensitive     CLINDAMYCIN <=0.25 SENSITIVE Sensitive     RIFAMPIN <=0.5 SENSITIVE Sensitive     Inducible Clindamycin NEGATIVE Sensitive     * METHICILLIN RESISTANT STAPHYLOCOCCUS AUREUS  Gram stain     Status: None   Collection Time: 02/03/16  2:39 PM  Result Value Ref Range Status   Specimen Description PLEURAL LEFT   Final   Special Requests NONE  Final   Gram Stain   Final    CYTOSPIN SMEAR WBC PRESENT, PREDOMINANTLY MONONUCLEAR NO ORGANISMS SEEN Performed at [facility]    Report Status 02/04/2016 FINAL  Final  Culture, blood (Routine X 2) w Reflex to ID Panel     Status: None (Preliminary result)   Collection Time: 02/03/16  4:45 PM  Result Value Ref Range Status   Specimen Description BLOOD RIGHT HAND  Final   Special Requests BOTTLES DRAWN AEROBIC ONLY [place]  Final   Culture NO GROWTH 3 DAYS  Final   Report Status PENDING  Incomplete  Culture, blood (Routine X 2) w Reflex to ID Panel     Status: None (Preliminary result)   Collection Time: 02/03/16  5:01 PM  Result Value Ref Range Status   Specimen Description BLOOD RIGHT HAND  Final   Special Requests BOTTLES DRAWN AEROBIC ONLY [place]  Final   Culture NO GROWTH 3 DAYS  Final   Report Status PENDING  Incomplete    Radiology Reports Dg Chest 1 View  Result Date: 02/03/2016 CLINICAL DATA:  Thoracentesis EXAM: CHEST 1 VIEW COMPARISON:  Chest x-ray from earlier today FINDINGS: Decrease in left pleural effusion. No re-expansion edema or visible pneumothorax. Stable right pleural effusion with fissural loculation. There is a right-sided pleural catheter with tip along the mid medial thoracic cavity. Left subclavian porta catheter with tip at the lower SVC. No cardiomegaly. IMPRESSION: 1. No acute finding after left thoracentesis. 2. Loculated right pleural effusion with tunneled pleural catheter in place. Electronically Signed   By: Monte Fantasia M.D.   On: 02/03/2016 15:29   Dg Chest 2 View  Result Date: 02/05/2016 CLINICAL DATA:  Shortness of breath, elevated white blood cell count. Stage 4right-sided lung malignancy. History of coronary artery disease with stent placement. Current smoker. EXAM: CHEST  2 VIEW COMPARISON:  Portable chest x-ray of February 03, 2016 FINDINGS: The lungs are better inflated today. The volume of pleural fluid on  the right is fairly stable. The interstitial markings in  the mid and lower lungs have improved. The heart is normal in size. The pulmonary vascularity is not engorged. There is calcification in the wall of the aortic arch. The Port-A-Cath tip projects over the midportion of the SVC. The right-sided chest tube tip projects over the medial aspect of the upper hemi thorax at approximately the T5-6 level. IMPRESSION: Improved aeration of both lung bases consistent with resolving atelectasis or pneumonia. Persistent moderate-sized right pleural effusion with trace left pleural effusion. No pneumothorax. Electronically Signed   By: David  Martinique M.D.   On: 02/05/2016 12:14   Dg Chest 2 View  Result Date: 01/30/2016 CLINICAL DATA:  Initial evaluation for acute left-sided chest pain, shortness of breath. EXAM: CHEST  2 VIEW COMPARISON:  Prior radiograph from 01/28/2016. FINDINGS: Left-sided Port-A-Cath in stable position. Cardiac and mediastinal silhouettes are unchanged, and remain within normal limits. Moderate bilateral pleural effusions. Associated bibasilar opacities likely atelectasis, although infiltrates are not excluded. Diffuse pulmonary vascular congestion with prominence of the interstitial markings, suggesting mild superimposed edema. No pneumothorax. No acute osseous abnormality. IMPRESSION: 1. Moderate bilateral pleural effusions with associated bibasilar opacities. Atelectasis/edema is favored, although infiltrates could be considered in the correct clinical setting. 2. Diffuse vascular congestion with interstitial prominence, suggesting superimposed mild edema. Electronically Signed   By: Jeannine Boga M.D.   On: 01/30/2016 23:06   Dg Chest 2 View  Result Date: 01/28/2016 CLINICAL DATA:  History of lung cancer. Right chest tube inserted January 09, 2016. No shortness of breath today. Congestion and hoarseness. EXAM: CHEST  2 VIEW COMPARISON:  January 08, 2016 FINDINGS: There is a right-sided  chest tube. The right-sided pleural effusion is much smaller in the interval. A moderate left-sided effusion is stable. The heart, hila, and mediastinum are unchanged. No pneumothorax. The left Port-A-Cath has been placed terminating in the mid SVC. Mild increased interstitial markings centrally suggest pulmonary venous congestion/ mild edema. No other interval changes. IMPRESSION: 1. The patient has a left-sided Port-A-Cath and a right-sided chest tube. The right-sided pleural effusion is much smaller in the interval. The moderate left effusion is stable. 2. Interval development of mild edema. Electronically Signed   By: Dorise Bullion III M.D   On: 01/28/2016 11:26   Dg Chest 2 View  Result Date: 01/08/2016 CLINICAL DATA:  Pleural effusion EXAM: CHEST  2 VIEW COMPARISON:  December 31, 2015 chest radiograph and chest CT December 22, 2015 FINDINGS: There is vague opacity in the right upper lobe in the area of lung mass well seen on recent CT. There are bilateral pleural effusions bilaterally, slightly larger on the left than on the right with patchy airspace consolidation in the left base. No new opacity is evident compared to most recent chest radiograph examination. The heart size and pulmonary vascularity are normal. There is atherosclerotic calcification in the aorta. No adenopathy. No evident pneumothorax. There is degenerative change in the thoracic spine. IMPRESSION: Vague opacity in the medial right upper lobe in the area of mass seen on recent CT. Bilateral pleural effusions, slightly larger on the left than on the right. Patchy airspace consolidation left base, stable. No new opacity. No change in cardiac silhouette. There is aortic atherosclerosis. Electronically Signed   By: Lowella Grip III M.D.   On: 01/08/2016 14:07   Ct Angio Chest Pe W/cm &/or Wo Cm  Result Date: 01/31/2016 CLINICAL DATA:  Bilateral chest pain for 2 days with dyspnea. EXAM: CT ANGIOGRAPHY CHEST WITH CONTRAST TECHNIQUE:  Multidetector CT imaging of the  chest was performed using the standard protocol during bolus administration of intravenous contrast. Multiplanar CT image reconstructions and MIPs were obtained to evaluate the vascular anatomy. CONTRAST:  100 mL Isovue 370 intravenous COMPARISON:  12/22/2015 FINDINGS: Cardiovascular: Satisfactory opacification of the pulmonary arteries to the segmental level. No evidence of pulmonary embolism. Normal heart size. No pericardial effusion. Normal heart size. Minimal pericardial effusion, unchanged. Mediastinum/Nodes: Unchanged hilar adenopathy bilaterally. The 14 mm short axis right hilar node is unchanged, visible on image 44 series 4. A 14 mm left hilar node is also unchanged, visible on image 42 series 4. Lungs/Pleura: Moderate left pleural effusion may be slightly larger than on 12/22/2015. There is significant size reduction of the right pleural effusion, with a small volume of partially loculated pleural fluid remaining despite presence of a right-sided pleural drain. There also is a very small right pneumothorax. The right upper lobe lung mass measures 2.4 x 3.8 cm and previously measured 2.3 x 5.3 cm. The right upper lobe pulmonary nodule measures 8 mm and previously measured 10 mm. The 8 mm left upper lobe nodule is not visible today. Possible 2.6 cm central right lower lobe mass on image 62 series 6 was not previously visible due to marked lower lobe atelectasis from the surrounding pleural fluid. It is possible that this instead represents loculated fluid in the right major fissure; there is extensive thickening and fluid in the fissures of the right lung. Upper Abdomen: No conclusive hepatic or adrenal metastases. Small volume upper abdominal ascites. Musculoskeletal: Lytic/sclerotic metastases at T1 and T2 are again evident without significant interval change. No new skeletal lesions are evident. Review of the MIP images confirms the above findings. IMPRESSION: 1. Negative  for pulmonary embolism. 2. Reduction in right pleural effusion with interim placement of a right pleural drain. There is a small residual right pleural collection which may be loculated. There also is a very small right pneumothorax which may relate to the pleural drain. 3. Decreased size of the right upper lobe mass. Possible central right lower lobe mass which was not previously visible but now that portion of the right lung is better expanded due to reduction of pleural fluid volume; it is possible that this instead represents a loculated collection of fluid in the major fissure. 4. Unchanged hilar adenopathy bilaterally. 5. Unchanged skeletal metastases. Electronically Signed   By: Andreas Newport M.D.   On: 01/31/2016 00:35   Dg Chest Port 1 View  Result Date: 02/03/2016 CLINICAL DATA:  Hypoxia EXAM: PORTABLE CHEST 1 VIEW COMPARISON:  January 30, 2016 FINDINGS: There is airspace consolidation throughout portions of the mid and lower lung zones bilaterally with evidence of left pleural effusion. Heart is mildly enlarged with pulmonary vascularity within normal limits. Port-A-Cath tip is in the superior vena cava. No pneumothorax. No bone lesions. There is atherosclerotic calcification in the aorta. IMPRESSION: Suspect multifocal pneumonia. There is increased opacity throughout the right mid and lower lung zone compared to most recent study with persistent consolidation in left effusion on the left without change. Stable cardiac silhouette. There is aortic atherosclerosis. Electronically Signed   By: Lowella Grip III M.D.   On: 02/03/2016 12:11   Dg Fluoro Guide Cv Line-no Report  Result Date: 01/09/2016 CLINICAL DATA:  FLOURO GUIDE CV LINE Fluoroscopy was utilized by the requesting physician.  No radiographic interpretation.   US Thoracentesis Asp Pleural Space W/img Guide  Result Date: 02/05/2016 INDICATION: Bilateral pleural effusions. EXAM: ULTRASOUND GUIDED LEFT THORACENTESIS MEDICATIONS:  None. COMPLICATIONS:  None immediate. PROCEDURE: An ultrasound guided thoracentesis was thoroughly discussed with the patient and questions answered. The benefits, risks, alternatives and complications were also discussed. The patient understands and wishes to proceed with the procedure. Written consent was obtained. Ultrasound was performed to localize and mark an adequate pocket of fluid in the left chest. The area was then prepped and draped in the normal sterile fashion. 1% Lidocaine was used for local anesthesia. Under ultrasound guidance a Yueh catheter was introduced. Thoracentesis was performed. The catheter was removed and a dressing applied. FINDINGS: A total of approximately 1,800 of grossly bloody cloudy fluid was removed. Samples were sent to the laboratory as requested by the clinical team. IMPRESSION: Successful ultrasound guided left thoracentesis yielding 1.8 L of pleural fluid. Electronically Signed   By: Lorriane Shire M.D.   On: 02/05/2016 09:26    Time Spent in minutes 25   Louellen Molder M.D on 02/06/2016 at 12:52 PM  Between 7am to 7pm - Pager - [phone]  After 7pm go to [email or web address] - password [facility]  [facility] -  Office  [phone]

## 2016-02-06 NOTE — Progress Notes (Signed)
Critical van result 50 called in with read back. Van antibiotic held.

## 2016-02-06 NOTE — Care Management Note (Signed)
Case Management Note  Patient Details  Name: Cameron Chandler MRN: [id number] Date of Birth: 1956/03/29  If discussed at [place] Length of Stay Meetings, dates discussed:  02/06/2016   Sherald Barge, RN 02/06/2016, 10:58 AM

## 2016-02-06 NOTE — Progress Notes (Addendum)
Pharmacy Antibiotic Note  Cameron Chandler is a 60 y.o. male admitted on 01/30/2016 with pneumonia.  Pharmacy has been consulted for Vancomycin and Cefepime dosing. Vancomycin trough supratherapeutic, will hold Vancomycin for now and  Repeat level later this afternoon.  Scr increased from baseline. 11/09 at [place] still supratherapeutic Plan:  Restart Vancomycin when trough <67mg/ml. Check random vancomycin level with AM labs Cefepime 1gm IV q8h Monitor labs, renal fxn, progress and c/s Deescalate ABX when improved / appropriate.    Height: '5\' 6"'$  (167.6 cm) Weight: 186 lb 9.6 oz (84.6 kg) IBW/kg (Calculated) : 63.8  Temp (24hrs), Avg:98.2 F (36.8 C), Min:98.1 F (36.7 C), Max:98.5 F (36.9 C)   Recent Labs Lab 01/30/16 2241 01/31/16 0132 01/31/16 0312 01/31/16 0627 02/01/16 0518 02/02/16 0702 02/03/16 0642 02/04/16 0614 02/05/16 0551 02/06/16 0607  WBC 5.2  --   --   --  8.5 11.8* 17.5* 26.0* 28.8* 36.0*  CREATININE 0.83  --   --   --  0.69 0.81 0.77 0.91 1.06  --   LATICACIDVEN 2.4* 1.7 2.3* 0.9  --   --   --   --   --   --   VANCOTROUGH  --   --   --   --   --   --   --   --   --  50*    Estimated Creatinine Clearance: 73.7 mL/min (by C-G formula based on SCr of 1.06 mg/dL).    Allergies  Allergen Reactions  . No Known Allergies    Antimicrobials this admission: Vancomycin 11/6 >>  Cefepime 11/6 >>   Dose adjustments this admission: 11/9 holding vancomycin Microbiology results: 11/6 [id number] pleural fluid: MRSA 9/21 MRSA PCR: positive   Thank you for allowing pharmacy to be a part of this patient's care. LIsac Sarna BS Pharm D, BCaliforniaClinical Pharmacist Pager #[phone] [id number]/11/2015 10:05 AM

## 2016-02-06 NOTE — Progress Notes (Addendum)
[facility] for Warfarin and Lovenox Indication: VTE Treatment  Allergies  Allergen Reactions  . No Known Allergies    Patient Measurements: Height: '5\' 6"'$  (167.6 cm) Weight: 186 lb 9.6 oz (84.6 kg) IBW/kg (Calculated) : 63.8  Vital Signs: Temp: 98.5 F (36.9 C) (11/09 0445) Temp Source: Oral (11/09 0445) BP: 115/83 (11/09 0445) Pulse Rate: 104 (11/09 0445)  Labs:  Recent Labs  02/04/16 0614 02/05/16 0551 02/06/16 0607  HGB 11.3* 10.2* 10.5*  HCT 35.1* 31.4* 31.8*  PLT 237 238 281  LABPROT 15.4* 15.1 14.6  INR 1.21 1.18 1.14  CREATININE 0.91 1.06  --    Estimated Creatinine Clearance: 73.7 mL/min (by C-G formula based on SCr of 1.06 mg/dL).  Medical History: Past Medical History:  Diagnosis Date  . Adenocarcinoma of right lung, stage 4 ([place]) 12/03/2015  . Arthritis    back, joint pain  . Bone cancer ([place])    T 1T2  . Cancer [facility]) 2011   colon cancer  . Colon cancer [facility])    Colon resection and Chemotherapy  . Coronary atherosclerosis of native coronary artery   . Diabetes mellitus without complication ([place])    takes Meformin and Levemir daily  . DVT (deep venous thrombosis) ([facility])    Left arm  . Dyspnea    lying,sitting,exertion  . Heart attack   . Hypothyroidism    takes Synthroid daily  . Myocardial infarction 2007  . Nocturia   . Other and unspecified hyperlipidemia    takes Simvastatin daily  . Prostate cancer ([place]) 12/13/12   ,Radiation  . Seizures ([place])    hx of-had one over 20 yrs ago  . Tingling    hands  . Unspecified essential hypertension    takes Diovan and Metoprolol daily  . Weakness    in legs   Medications:  Prescriptions Prior to Admission  Medication Sig Dispense Refill Last Dose  . aspirin EC 81 MG tablet Take 81 mg by mouth daily.   01/28/2016 at Unknown time  . dexamethasone (DECADRON) 4 MG tablet 4 mg by mouth twice a day the day before, day of and day after the chemotherapy every 3  weeks (Patient taking differently: Take 4 mg by mouth 2 (two) times daily. 4 mg by mouth twice a day the day before, day of and day after the chemotherapy every 3 weeks) 40 tablet 1 01/13/2016 at Unknown time  . Fenofibric Acid 105 MG TABS Take 1 tablet by mouth daily.   24/40/1027  . folic acid (FOLVITE) 1 MG tablet Take 1 tablet (1 mg total) by mouth daily. 30 tablet 4 01/28/2016 at Unknown time  . furosemide (LASIX) 40 MG tablet Take 1 tablet (40 mg total) by mouth daily. 14 tablet 1 01/28/2016 at Unknown time  . gabapentin (NEURONTIN) 100 MG capsule Take 1 capsule (100 mg total) by mouth 3 (three) times daily. (Patient taking differently: Take 300 mg by mouth 3 (three) times daily. ) 90 capsule 0 01/28/2016 at Unknown time  . hydrochlorothiazide (HYDRODIURIL) 25 MG tablet Take 25 mg by mouth daily.   01/28/2016 at Unknown time  . HYDROcodone-acetaminophen (NORCO) 7.5-325 MG tablet Take 1 tablet by mouth 3 (three) times daily. 30 tablet 0 01/30/2016 at Unknown time  . levothyroxine (SYNTHROID, LEVOTHROID) 25 MCG tablet Take 1 tablet (25 mcg total) by mouth daily before breakfast. 30 tablet 11 01/28/2016 at Unknown time  . lidocaine-prilocaine (EMLA) cream Apply 1 application topically as needed. (Patient  taking differently: Apply 1 application topically as needed (for port access). ) 30 g 0 unknown  . meloxicam (MOBIC) 7.5 MG tablet Take 7.5 mg by mouth daily.   01/28/2016 at Unknown time  . metFORMIN (GLUMETZA) 500 MG (MOD) 24 hr tablet Take 1,000 mg by mouth 2 (two) times daily with a meal.   01/28/2016  . metoprolol tartrate (LOPRESSOR) 25 MG tablet Take 12.5 mg by mouth 2 (two) times daily.   01/28/2016 at Unknown time  . potassium chloride (K-DUR,KLOR-CON) 10 MEQ tablet Take 1 tablet (10 mEq total) by mouth daily. 14 tablet 1 01/28/2016 at Unknown time  . prochlorperazine (COMPAZINE) 10 MG tablet Take 1 tablet (10 mg total) by mouth every 6 (six) hours as needed for nausea or vomiting. 30 tablet  0 unknown  . simvastatin (ZOCOR) 80 MG tablet TAKE 1 TABLET BY MOUTH DAILY AT 6 P.M. 30 tablet 3 01/28/2016 at Unknown time  . valsartan (DIOVAN) 160 MG tablet Take 160 mg by mouth daily.   01/28/2016  . warfarin (COUMADIN) 5 MG tablet Take 5 mg by mouth daily.   1 01/28/2016 at Unknown time  . Wound Dressings (SONAFINE) Apply 1 application topically daily.   01/28/2016 at Unknown time  . insulin detemir (LEVEMIR) 100 unit/ml SOLN Inject 30 Units into the skin at bedtime.   over 30 days  . nitroGLYCERIN (NITROSTAT) 0.4 MG SL tablet Place 0.4 mg under the tongue every 5 (five) minutes as needed for chest pain.   unknown   Assessment: 60 yo male taking warfarin PTA for DVT in left extremity. INR elevated on admission but has trended down after holding Coumadin and giving Vit. K.  S/P thoracentesis 11/6; 1620m red colored pleural fluid drained.  Warfarin restarted on 11/7.  Pt also on Fluconazole which may interact with warfarin to increase INR. INR 1.14 today, may take a few days to increase, especially with Vit K administration. Requested to bridge coumadin with lovenox  Goal of Therapy:  INR 2-3 Monitor platelets by anticoagulation protocol: Yes   Plan:  Lovenox '130mg'$  sq q24h(1.'5mg'$ /kg/day) Warfarin '5mg'$  PO x 1 today INR/PT daily Monitor CBC, platelets, signs of bleeding  LIsac Sarna BS PVena [place] BCPS Clinical Pharmacist Pager #[phone] 160711/11/2015,10:21 AM

## 2016-02-07 ENCOUNTER — Telehealth: Payer: Self-pay

## 2016-02-07 DIAGNOSIS — D689 Coagulation defect, unspecified: Secondary | ICD-10-CM

## 2016-02-07 DIAGNOSIS — E876 Hypokalemia: Secondary | ICD-10-CM

## 2016-02-07 DIAGNOSIS — Z9889 Other specified postprocedural states: Secondary | ICD-10-CM

## 2016-02-07 DIAGNOSIS — A4902 Methicillin resistant Staphylococcus aureus infection, unspecified site: Secondary | ICD-10-CM | POA: Diagnosis present

## 2016-02-07 DIAGNOSIS — E119 Type 2 diabetes mellitus without complications: Secondary | ICD-10-CM

## 2016-02-07 DIAGNOSIS — E871 Hypo-osmolality and hyponatremia: Secondary | ICD-10-CM

## 2016-02-07 DIAGNOSIS — Z22322 Carrier or suspected carrier of Methicillin resistant Staphylococcus aureus: Secondary | ICD-10-CM

## 2016-02-07 DIAGNOSIS — R791 Abnormal coagulation profile: Secondary | ICD-10-CM

## 2016-02-07 DIAGNOSIS — I82622 Acute embolism and thrombosis of deep veins of left upper extremity: Secondary | ICD-10-CM

## 2016-02-07 LAB — GLUCOSE, CAPILLARY
GLUCOSE-CAPILLARY: 114 mg/dL — AB (ref 65–99)
Glucose-Capillary: 140 mg/dL — ABNORMAL HIGH (ref 65–99)
Glucose-Capillary: 122 mg/dL — ABNORMAL HIGH (ref 65–99)

## 2016-02-07 LAB — BASIC METABOLIC PANEL
ANION GAP: 8 (ref 5–15)
BUN: 31 mg/dL — ABNORMAL HIGH (ref 6–20)
CALCIUM: 7.8 mg/dL — AB (ref 8.9–10.3)
CHLORIDE: 95 mmol/L — AB (ref 101–111)
CO2: 29 mmol/L (ref 22–32)
CREATININE: 1.1 mg/dL (ref 0.61–1.24)
GFR calc non Af Amer: >60 mL/min (ref >60)
Glucose, Bld: 140 mg/dL — ABNORMAL HIGH (ref 65–99)
Potassium: 3.6 mmol/L (ref 3.5–5.1)
SODIUM: 132 mmol/L — AB (ref 135–145)

## 2016-02-07 LAB — PROTIME-INR
INR: 1.42
PROTHROMBIN TIME: 17.5 s — AB (ref 11.4–15.2)

## 2016-02-07 LAB — CBC
HCT: 29.5 % — ABNORMAL LOW (ref 39.0–52.0)
Hemoglobin: 9.9 g/dL — ABNORMAL LOW (ref 13.0–17.0)
MCH: 25.9 pg — AB (ref 26.0–34.0)
MCHC: 33.6 g/dL (ref 30.0–36.0)
MCV: 77.2 fL — AB (ref 78.0–100.0)
PLATELETS: 330 10*3/uL (ref 150–400)
RBC: 3.82 MIL/uL — ABNORMAL LOW (ref 4.22–5.81)
RDW: 18.9 % — AB (ref 11.5–15.5)
WBC: 33.7 10*3/uL — ABNORMAL HIGH (ref 4.0–10.5)

## 2016-02-07 LAB — VANCOMYCIN, RANDOM: VANCOMYCIN RM: 26

## 2016-02-07 MED ORDER — VANCOMYCIN HCL IN DEXTROSE 1-5 GM/200ML-% IV SOLN
1000.0000 mg | INTRAVENOUS | Status: DC
  Administered 2016-02-07: 1000 mg via INTRAVENOUS
  Filled 2016-02-07: qty 200

## 2016-02-07 MED ORDER — ENSURE ENLIVE PO LIQD: 237.0000 mL | Freq: Two times a day (BID) | ORAL | 12 refills | Status: AC

## 2016-02-07 MED ORDER — VANCOMYCIN HCL IN DEXTROSE 1-5 GM/200ML-% IV SOLN: 1000.0000 mg | INTRAVENOUS | 0 refills | Status: DC

## 2016-02-07 MED ORDER — WARFARIN SODIUM 5 MG PO TABS
5.0000 mg | ORAL_TABLET | Freq: Once | ORAL | Status: AC
  Administered 2016-02-07: 5 mg via ORAL
  Filled 2016-02-07: qty 1

## 2016-02-07 MED ORDER — ENOXAPARIN SODIUM 120 MG/0.8ML ~~LOC~~ SOLN: 120.0000 mg | SUBCUTANEOUS | 0 refills | Status: DC

## 2016-02-07 MED ORDER — HYDROCODONE-ACETAMINOPHEN 7.5-325 MG PO TABS: 1.0000 | ORAL_TABLET | Freq: Three times a day (TID) | ORAL | 0 refills | Status: AC

## 2016-02-07 NOTE — Progress Notes (Signed)
Discharge instructions and prescriptions given to daughter who verbalized understanding, out in stable condition via w/c with staff.  IV portacath remained accessed and flushed with saline due to home IV antibiotic use.

## 2016-02-07 NOTE — Progress Notes (Signed)
[facility] for Warfarin and Lovenox Indication: VTE Treatment  Allergies  Allergen Reactions  . No Known Allergies    Patient Measurements: Height: '5\' 6"'$  (167.6 cm) Weight: 181 lb 12.8 oz (82.5 kg) IBW/kg (Calculated) : 63.8  Vital Signs: Temp Source: Oral (11/10 0543) BP: 104/70 (11/10 0543) Pulse Rate: 111 (11/10 0543)  Labs:  Recent Labs  02/05/16 0551 02/06/16 0607 02/07/16 0601  HGB 10.2* 10.5*  --   HCT 31.4* 31.8*  --   PLT 238 281  --   LABPROT 15.1 14.6 17.5*  INR 1.18 1.14 1.42  CREATININE 1.06  --  1.10   Estimated Creatinine Clearance: 70.2 mL/min (by C-G formula based on SCr of 1.1 mg/dL).  Medical History: Past Medical History:  Diagnosis Date  . Adenocarcinoma of right lung, stage 4 ([place]) 12/03/2015  . Arthritis    back, joint pain  . Bone cancer ([place])    T 1T2  . Cancer [facility]) 2011   colon cancer  . Colon cancer [facility])    Colon resection and Chemotherapy  . Coronary atherosclerosis of native coronary artery   . Diabetes mellitus without complication ([place])    takes Meformin and Levemir daily  . DVT (deep venous thrombosis) ([facility])    Left arm  . Dyspnea    lying,sitting,exertion  . Heart attack   . Hypothyroidism    takes Synthroid daily  . Myocardial infarction 2007  . Nocturia   . Other and unspecified hyperlipidemia    takes Simvastatin daily  . Prostate cancer ([place]) 12/13/12   ,Radiation  . Seizures ([place])    hx of-had one over 20 yrs ago  . Tingling    hands  . Unspecified essential hypertension    takes Diovan and Metoprolol daily  . Weakness    in legs   Medications:  Prescriptions Prior to Admission  Medication Sig Dispense Refill Last Dose  . aspirin EC 81 MG tablet Take 81 mg by mouth daily.   01/28/2016 at Unknown time  . dexamethasone (DECADRON) 4 MG tablet 4 mg by mouth twice a day the day before, day of and day after the chemotherapy every 3 weeks (Patient taking differently: Take 4  mg by mouth 2 (two) times daily. 4 mg by mouth twice a day the day before, day of and day after the chemotherapy every 3 weeks) 40 tablet 1 01/13/2016 at Unknown time  . Fenofibric Acid 105 MG TABS Take 1 tablet by mouth daily.   50/93/2671  . folic acid (FOLVITE) 1 MG tablet Take 1 tablet (1 mg total) by mouth daily. 30 tablet 4 01/28/2016 at Unknown time  . furosemide (LASIX) 40 MG tablet Take 1 tablet (40 mg total) by mouth daily. 14 tablet 1 01/28/2016 at Unknown time  . gabapentin (NEURONTIN) 100 MG capsule Take 1 capsule (100 mg total) by mouth 3 (three) times daily. (Patient taking differently: Take 300 mg by mouth 3 (three) times daily. ) 90 capsule 0 01/28/2016 at Unknown time  . hydrochlorothiazide (HYDRODIURIL) 25 MG tablet Take 25 mg by mouth daily.   01/28/2016 at Unknown time  . HYDROcodone-acetaminophen (NORCO) 7.5-325 MG tablet Take 1 tablet by mouth 3 (three) times daily. 30 tablet 0 01/30/2016 at Unknown time  . levothyroxine (SYNTHROID, LEVOTHROID) 25 MCG tablet Take 1 tablet (25 mcg total) by mouth daily before breakfast. 30 tablet 11 01/28/2016 at Unknown time  . lidocaine-prilocaine (EMLA) cream Apply 1 application topically as needed. (Patient taking  differently: Apply 1 application topically as needed (for port access). ) 30 g 0 unknown  . meloxicam (MOBIC) 7.5 MG tablet Take 7.5 mg by mouth daily.   01/28/2016 at Unknown time  . metFORMIN (GLUMETZA) 500 MG (MOD) 24 hr tablet Take 1,000 mg by mouth 2 (two) times daily with a meal.   01/28/2016  . metoprolol tartrate (LOPRESSOR) 25 MG tablet Take 12.5 mg by mouth 2 (two) times daily.   01/28/2016 at Unknown time  . potassium chloride (K-DUR,KLOR-CON) 10 MEQ tablet Take 1 tablet (10 mEq total) by mouth daily. 14 tablet 1 01/28/2016 at Unknown time  . prochlorperazine (COMPAZINE) 10 MG tablet Take 1 tablet (10 mg total) by mouth every 6 (six) hours as needed for nausea or vomiting. 30 tablet 0 unknown  . simvastatin (ZOCOR) 80 MG  tablet TAKE 1 TABLET BY MOUTH DAILY AT 6 P.M. 30 tablet 3 01/28/2016 at Unknown time  . valsartan (DIOVAN) 160 MG tablet Take 160 mg by mouth daily.   01/28/2016  . warfarin (COUMADIN) 5 MG tablet Take 5 mg by mouth daily.   1 01/28/2016 at Unknown time  . Wound Dressings (SONAFINE) Apply 1 application topically daily.   01/28/2016 at Unknown time  . insulin detemir (LEVEMIR) 100 unit/ml SOLN Inject 30 Units into the skin at bedtime.   over 30 days  . nitroGLYCERIN (NITROSTAT) 0.4 MG SL tablet Place 0.4 mg under the tongue every 5 (five) minutes as needed for chest pain.   unknown   Assessment: 60 yo male taking warfarin PTA for DVT in left extremity. INR elevated on admission but has trended down after holding Coumadin and giving Vit. K.  S/P thoracentesis 11/6; 1633m red colored pleural fluid drained.  Warfarin restarted on 11/7.  Pt also on Fluconazole which may interact with warfarin to increase INR.  Requested to bridge coumadin with lovenox  Goal of Therapy:  INR 2-3 Monitor platelets by anticoagulation protocol: Yes   Plan:  Lovenox '130mg'$  sq q24h(1.'5mg'$ /kg/day) Warfarin '5mg'$  PO x 1 today INR/PT daily Monitor CBC, platelets, signs of bleeding Thanks for allowing pharmacy to be a part of this patient's care.  LExcell Seltzer PharmD Clinical Pharmacist   02/07/2016,8:58 AM

## 2016-02-07 NOTE — Care Management Important Message (Signed)
Important Message  Patient Details  Name: Cameron Chandler MRN: [id number] Date of Birth: May 12, 1955   Medicare Important Message Given:  Yes    Sherald Barge, RN 02/07/2016, 8:23 AM

## 2016-02-07 NOTE — Telephone Encounter (Signed)
Pt being discharged from [facility]. Has appt on 11/20 with Dr Julien Nordmann. 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 Olin Hauser called asking if she needs to keep 11/20 appt to see Dr Julien Nordmann. Told her to keep appt to clarify future plan of care.

## 2016-02-07 NOTE — Discharge Summary (Addendum)
Physician Discharge Summary  Cameron Chandler CNO:[id number] DOB: [birth year] DOA: 01/30/2016  PCP: [name], MD  Admit date: 01/30/2016 Discharge date: 02/07/2016  Admitted from: Home Disposition:  Home with home health  Recommendations for Outpatient Follow-up:  1. Follow up with PCP On 11/14 at 4:30 PM. 2. Patient will complete 2 weeks of IV antibiotics (vancomycin) on 11/24. 3. Patient is being discharged on Lovenox bridge with Coumadin for subtherapeutic INR. Please check INR on 11/13 and adjust Coumadin dose, stop Lovenox if appropriate. 4. Please check CBC (has leukocytosis) and renal function during outpatient follow-up. 5. Patient given about 5-7 days prescription for pain medications. Please provide refill as needed are appropriate. 6. Please refer patient to outpatient wound care clinic.  Home Health: RN and PT Equipment/Devices: None Discharge Condition: Guarded  CODE STATUS: Full code Diet recommendation: Dysphagia level 3  with supplements   Discharge Diagnoses:  Principal Problem:   Acute on chronic diastolic CHF (congestive heart failure) (HCC)   Active Problems:     MRSA infection (methicillin-resistant Staphylococcus aureus)   Healthcare associated pneumonia ([place])   Essential hypertension   CAD, NATIVE VESSEL   Diabetes mellitus without complication (HCC)   Pleural effusion, bilateral   Hypothyroidism, acquired   Adenocarcinoma of right lung, stage 4 (HCC)   Acute respiratory failure with hypoxia (HCC)   DVT of upper extremity (deep vein thrombosis) (HCC)   Hypoxia   Hyponatremia   Hypokalemia   Supratherapeutic INR   Thrombocytopenia (HCC)   Dysphagia   Protein-calorie malnutrition, severe   Goals of care, counseling/discussion   Palliative care encounter   Pressure injury of skin   Coagulopathy ([place])   Status post thoracentesis  Brief narrative/history of present illness Please refer to admission H&P for details, in brief,60 year old male  with stage IV adenocarcinoma of the lung (currently on chemotherapy and radiation), chronic diastolic CHF, insulin-dependent diabetes mellitus, hypertension, CAD, left upper extremity DVT on Coumadin presented with a ED with progressive dyspnea for past 2 days. 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 Patient found to have recurrent right pleural effusion and left pleural effusion with acute on chronic diastolic CHF as well as healthcare associated pneumonia. Culture from left thoracentesis positive for MRSA.  Principal Problem: Acute hypoxic respiratory failure (HCC) Multifactorial with combination of bilateral pleural effusions, acute diastolic CHF, healthcare associated pneumonia and underlying lung cancer. CT chest was negative for PE but showed a small pneumothorax. Surgery consulted who recommended no need for chest tube placement. Right Pleurx drainage every other day. 2-D echo with EF of 65-70%). 1 diastolic dysfunction. Symptoms much better. Resume home Lasix dose. Left sided thoracenteses done on 11/6 1800 mL bloody cloudy fluid removed. cx growing MRSA.  Healthcare associated pneumonia and progressive leucocytosis On empiric vancomycin and cefepime.  Blood cultures negative but Left pleural fluid cx growing MRSA. discussed findings and progressive leucocytosis with ID Dr Megan Salon on the phone. Patient not on steroids or received neupogen recently. Recommends 2 weeks of IV vancomycin. Antibiotics can be given. His Port-A-Cath. Patient needs to have CBC, renal function and vancomycin trough checked during outpatient visit with his PCP next week. This was notified to his nurse on the phone.    Acute on chronic diastolic CHF (congestive heart failure) (HCC) Improved with IV Lasix. Resume home Lasix dose.     Active Problems:   Left Upper extremity DVT On Coumadin as outpatient. Patient was supratherapeutic on admission and was reversed with vitamin K for  thoracentesis. INR now subtherapeutic. Bridging with subcutaneous Lovenox (120 mg daily for next 3-4 days). Patient has appointment to see PCP early next week and have instructed his office to check his INR and adjust Coumadin dose.  Hypokalemia and hypomagnesemia Replenished.     Adenocarcinoma of right lung, stage 4 ([place]) Getting chemotherapy and radiation as outpatient. Palliative care discussed goals of care with patient on 11/3.  wishes to be full code and wants to continue chemotherapy and radiation for now. Follow-up with Dr. Julien Nordmann and radiation oncology.     Protein-calorie malnutrition, severe Continue supplements.  Hypothyroidism Continue Synthroid.  Diabetes mellitus type 2 Monitor on sliding scale coverage.  Bilateral lower extremity wound. Does not appear infected. Seen by wound care nurse and recommended Foam dressings to absorb drainage and protect from further injury.  Ace wrap for light compressionDaily dressing changes were home health nurse. Recommend referral to wound care center as an outpatient.  Grade 2 buttock sores/ pressure injury  care per nursing  Family Communication  : Discussed with daughter on the phone  Disposition Plan  : Home with home health    Consults  :  Palliative care  Procedures  :  Left thoracentesis 2-D echo   Discharge Instructions     Medication List    TAKE these medications   aspirin EC 81 MG tablet Take 81 mg by mouth daily.   dexamethasone 4 MG tablet Commonly known as:  DECADRON 4 mg by mouth twice a day the day before, day of and day after the chemotherapy every 3 weeks What changed:  how much to take  how to take this  when to take this  additional instructions   enoxaparin 120 MG/0.8ML injection Commonly known as:  LOVENOX Inject 0.8 mLs (120 mg total) into the skin daily.   feeding supplement (ENSURE ENLIVE) Liqd Take 237 mLs by mouth 2 (two) times daily between meals.    Fenofibric Acid 105 MG Tabs Take 1 tablet by mouth daily.   folic acid 1 MG tablet Commonly known as:  FOLVITE Take 1 tablet (1 mg total) by mouth daily.   furosemide 40 MG tablet Commonly known as:  LASIX Take 1 tablet (40 mg total) by mouth daily.   gabapentin 100 MG capsule Commonly known as:  NEURONTIN Take 1 capsule (100 mg total) by mouth 3 (three) times daily. What changed:  how much to take   hydrochlorothiazide 25 MG tablet Commonly known as:  HYDRODIURIL Take 25 mg by mouth daily.   HYDROcodone-acetaminophen 7.5-325 MG tablet Commonly known as:  NORCO Take 1 tablet by mouth 3 (three) times daily.   insulin detemir 100 unit/ml Soln Commonly known as:  LEVEMIR Inject 30 Units into the skin at bedtime.   levothyroxine 25 MCG tablet Commonly known as:  SYNTHROID, LEVOTHROID Take 1 tablet (25 mcg total) by mouth daily before breakfast.   lidocaine-prilocaine cream Commonly known as:  EMLA Apply 1 application topically as needed. What changed:  reasons to take this   meloxicam 7.5 MG tablet Commonly known as:  MOBIC Take 7.5 mg by mouth daily.   metFORMIN 500 MG (MOD) 24 hr tablet Commonly known as:  GLUMETZA Take 1,000 mg by mouth 2 (two) times daily with a meal.   metoprolol tartrate 25 MG tablet Commonly known as:  LOPRESSOR Take 12.5 mg by mouth 2 (two) times daily.   nitroGLYCERIN 0.4 MG SL tablet Commonly known as:  NITROSTAT Place 0.4 mg under the tongue every 5 (  five) minutes as needed for chest pain.   potassium chloride 10 MEQ tablet Commonly known as:  K-DUR,KLOR-CON Take 1 tablet (10 mEq total) by mouth daily.   prochlorperazine 10 MG tablet Commonly known as:  COMPAZINE Take 1 tablet (10 mg total) by mouth every 6 (six) hours as needed for nausea or vomiting.   simvastatin 80 MG tablet Commonly known as:  ZOCOR TAKE 1 TABLET BY MOUTH DAILY AT 6 P.M.   SONAFINE Apply 1 application topically daily.   valsartan 160 MG  tablet Commonly known as:  DIOVAN Take 160 mg by mouth daily.   vancomycin 1-5 GM/200ML-% Soln Commonly known as:  VANCOCIN Inject 200 mLs (1,000 mg total) into the vein daily.   warfarin 5 MG tablet Commonly known as:  COUMADIN Take 5 mg by mouth daily.      Follow-up Information    [name], MD Follow up on 02/11/2016.   Specialty:  Internal Medicine Why:  at 4:40 pm Contact information: [place] [phone]          Allergies  Allergen Reactions  . No Known Allergies       Procedures/Studies: Dg Chest 1 View  Result Date: 02/03/2016 CLINICAL DATA:  Thoracentesis EXAM: CHEST 1 VIEW COMPARISON:  Chest x-ray from earlier today FINDINGS: Decrease in left pleural effusion. No re-expansion edema or visible pneumothorax. Stable right pleural effusion with fissural loculation. There is a right-sided pleural catheter with tip along the mid medial thoracic cavity. Left subclavian porta catheter with tip at the lower SVC. No cardiomegaly. IMPRESSION: 1. No acute finding after left thoracentesis. 2. Loculated right pleural effusion with tunneled pleural catheter in place. Electronically Signed   By: Monte Fantasia M.D.   On: 02/03/2016 15:29   Dg Chest 2 View  Result Date: 02/05/2016 CLINICAL DATA:  Shortness of breath, elevated white blood cell count. Stage 4right-sided lung malignancy. History of coronary artery disease with stent placement. Current smoker. EXAM: CHEST  2 VIEW COMPARISON:  Portable chest x-ray of February 03, 2016 FINDINGS: The lungs are better inflated today. The volume of pleural fluid on the right is fairly stable. The interstitial markings in the mid and lower lungs have improved. The heart is normal in size. The pulmonary vascularity is not engorged. There is calcification in the wall of the aortic arch. The Port-A-Cath tip projects over the midportion of the SVC. The right-sided chest tube tip projects over the medial  aspect of the upper hemi thorax at approximately the T5-6 level. IMPRESSION: Improved aeration of both lung bases consistent with resolving atelectasis or pneumonia. Persistent moderate-sized right pleural effusion with trace left pleural effusion. No pneumothorax. Electronically Signed   By: David  Martinique M.D.   On: 02/05/2016 12:14   Dg Chest 2 View  Result Date: 01/30/2016 CLINICAL DATA:  Initial evaluation for acute left-sided chest pain, shortness of breath. EXAM: CHEST  2 VIEW COMPARISON:  Prior radiograph from 01/28/2016. FINDINGS: Left-sided Port-A-Cath in stable position. Cardiac and mediastinal silhouettes are unchanged, and remain within normal limits. Moderate bilateral pleural effusions. Associated bibasilar opacities likely atelectasis, although infiltrates are not excluded. Diffuse pulmonary vascular congestion with prominence of the interstitial markings, suggesting mild superimposed edema. No pneumothorax. No acute osseous abnormality. IMPRESSION: 1. Moderate bilateral pleural effusions with associated bibasilar opacities. Atelectasis/edema is favored, although infiltrates could be considered in the correct clinical setting. 2. Diffuse vascular congestion with interstitial prominence, suggesting superimposed mild edema. Electronically Signed  By: Jeannine Boga M.D.   On: 01/30/2016 23:06   Dg Chest 2 View  Result Date: 01/28/2016 CLINICAL DATA:  History of lung cancer. Right chest tube inserted January 09, 2016. No shortness of breath today. Congestion and hoarseness. EXAM: CHEST  2 VIEW COMPARISON:  January 08, 2016 FINDINGS: There is a right-sided chest tube. The right-sided pleural effusion is much smaller in the interval. A moderate left-sided effusion is stable. The heart, hila, and mediastinum are unchanged. No pneumothorax. The left Port-A-Cath has been placed terminating in the mid SVC. Mild increased interstitial markings centrally suggest pulmonary venous congestion/ mild  edema. No other interval changes. IMPRESSION: 1. The patient has a left-sided Port-A-Cath and a right-sided chest tube. The right-sided pleural effusion is much smaller in the interval. The moderate left effusion is stable. 2. Interval development of mild edema. Electronically Signed   By: Dorise Bullion III M.D   On: 01/28/2016 11:26   Dg Chest 2 View  Result Date: 01/08/2016 CLINICAL DATA:  Pleural effusion EXAM: CHEST  2 VIEW COMPARISON:  December 31, 2015 chest radiograph and chest CT December 22, 2015 FINDINGS: There is vague opacity in the right upper lobe in the area of lung mass well seen on recent CT. There are bilateral pleural effusions bilaterally, slightly larger on the left than on the right with patchy airspace consolidation in the left base. No new opacity is evident compared to most recent chest radiograph examination. The heart size and pulmonary vascularity are normal. There is atherosclerotic calcification in the aorta. No adenopathy. No evident pneumothorax. There is degenerative change in the thoracic spine. IMPRESSION: Vague opacity in the medial right upper lobe in the area of mass seen on recent CT. Bilateral pleural effusions, slightly larger on the left than on the right. Patchy airspace consolidation left base, stable. No new opacity. No change in cardiac silhouette. There is aortic atherosclerosis. Electronically Signed   By: Lowella Grip III M.D.   On: 01/08/2016 14:07   Ct Angio Chest Pe W/cm &/or Wo Cm  Result Date: 01/31/2016 CLINICAL DATA:  Bilateral chest pain for 2 days with dyspnea. EXAM: CT ANGIOGRAPHY CHEST WITH CONTRAST TECHNIQUE: Multidetector CT imaging of the chest was performed using the standard protocol during bolus administration of intravenous contrast. Multiplanar CT image reconstructions and MIPs were obtained to evaluate the vascular anatomy. CONTRAST:  100 mL Isovue 370 intravenous COMPARISON:  12/22/2015 FINDINGS: Cardiovascular: Satisfactory  opacification of the pulmonary arteries to the segmental level. No evidence of pulmonary embolism. Normal heart size. No pericardial effusion. Normal heart size. Minimal pericardial effusion, unchanged. Mediastinum/Nodes: Unchanged hilar adenopathy bilaterally. The 14 mm short axis right hilar node is unchanged, visible on image 44 series 4. A 14 mm left hilar node is also unchanged, visible on image 42 series 4. Lungs/Pleura: Moderate left pleural effusion may be slightly larger than on 12/22/2015. There is significant size reduction of the right pleural effusion, with a small volume of partially loculated pleural fluid remaining despite presence of a right-sided pleural drain. There also is a very small right pneumothorax. The right upper lobe lung mass measures 2.4 x 3.8 cm and previously measured 2.3 x 5.3 cm. The right upper lobe pulmonary nodule measures 8 mm and previously measured 10 mm. The 8 mm left upper lobe nodule is not visible today. Possible 2.6 cm central right lower lobe mass on image 62 series 6 was not previously visible due to marked lower lobe atelectasis from the surrounding pleural fluid. It  is possible that this instead represents loculated fluid in the right major fissure; there is extensive thickening and fluid in the fissures of the right lung. Upper Abdomen: No conclusive hepatic or adrenal metastases. Small volume upper abdominal ascites. Musculoskeletal: Lytic/sclerotic metastases at T1 and T2 are again evident without significant interval change. No new skeletal lesions are evident. Review of the MIP images confirms the above findings. IMPRESSION: 1. Negative for pulmonary embolism. 2. Reduction in right pleural effusion with interim placement of a right pleural drain. There is a small residual right pleural collection which may be loculated. There also is a very small right pneumothorax which may relate to the pleural drain. 3. Decreased size of the right upper lobe mass. Possible  central right lower lobe mass which was not previously visible but now that portion of the right lung is better expanded due to reduction of pleural fluid volume; it is possible that this instead represents a loculated collection of fluid in the major fissure. 4. Unchanged hilar adenopathy bilaterally. 5. Unchanged skeletal metastases. Electronically Signed   By: Andreas Newport M.D.   On: 01/31/2016 00:35   Dg Chest Port 1 View  Result Date: 02/03/2016 CLINICAL DATA:  Hypoxia EXAM: PORTABLE CHEST 1 VIEW COMPARISON:  January 30, 2016 FINDINGS: There is airspace consolidation throughout portions of the mid and lower lung zones bilaterally with evidence of left pleural effusion. Heart is mildly enlarged with pulmonary vascularity within normal limits. Port-A-Cath tip is in the superior vena cava. No pneumothorax. No bone lesions. There is atherosclerotic calcification in the aorta. IMPRESSION: Suspect multifocal pneumonia. There is increased opacity throughout the right mid and lower lung zone compared to most recent study with persistent consolidation in left effusion on the left without change. Stable cardiac silhouette. There is aortic atherosclerosis. Electronically Signed   By: Lowella Grip III M.D.   On: 02/03/2016 12:11   Dg Fluoro Guide Cv Line-no Report  Result Date: 01/09/2016 CLINICAL DATA:  FLOURO GUIDE CV LINE Fluoroscopy was utilized by the requesting physician.  No radiographic interpretation.   US Thoracentesis Asp Pleural Space W/img Guide  Result Date: 02/05/2016 INDICATION: Bilateral pleural effusions. EXAM: ULTRASOUND GUIDED LEFT THORACENTESIS MEDICATIONS: None. COMPLICATIONS: None immediate. PROCEDURE: An ultrasound guided thoracentesis was thoroughly discussed with the patient and questions answered. The benefits, risks, alternatives and complications were also discussed. The patient understands and wishes to proceed with the procedure. Written consent was obtained. Ultrasound  was performed to localize and mark an adequate pocket of fluid in the left chest. The area was then prepped and draped in the normal sterile fashion. 1% Lidocaine was used for local anesthesia. Under ultrasound guidance a Yueh catheter was introduced. Thoracentesis was performed. The catheter was removed and a dressing applied. FINDINGS: A total of approximately 1,800 of grossly bloody cloudy fluid was removed. Samples were sent to the laboratory as requested by the clinical team. IMPRESSION: Successful ultrasound guided left thoracentesis yielding 1.8 L of pleural fluid. Electronically Signed   By: Lorriane Shire M.D.   On: 02/05/2016 09:26     Subjective: Feels much better. Dyspnea improved.  Discharge Exam: Vitals:   02/06/16 1937 02/07/16 0543  BP: 110/65 104/70  Pulse: (!) 103 (!) 111  Resp: 20 18  Temp: 98.4 F (36.9 C)    Vitals:   02/06/16 1445 02/06/16 1937 02/06/16 1944 02/07/16 0543  BP: 97/63 110/65  104/70  Pulse: 99 (!) 103  (!) 111  Resp: '20 20  18  '$ Temp: 97.6  F (36.4 C) 98.4 F (36.9 C)    TempSrc: Oral Oral  Oral  SpO2: 97% 94% 91% 93%  Weight:    82.5 kg (181 lb 12.8 oz)  Height:        Gen: not in distress, Appears fatigued HEENT:  moist mucosa, supple neck Chest: Diminished bilateral breath sounds, right Pleurx catheter, port A cath CVS: N S1&S2, no murmurs GI: soft, NT, ND, bowel sounds present Musculoskeletal: warm, dressing over bilateral leg, improved edema    The results of significant diagnostics from this hospitalization (including imaging, microbiology, ancillary and laboratory) are listed below for reference.     Microbiology: Recent Results (from the past 240 hour(s))  Culture, body fluid-bottle     Status: Abnormal   Collection Time: 02/03/16  2:35 PM  Result Value Ref Range Status   Specimen Description PLEURAL LEFT  Final   Special Requests BOTTLES DRAWN AEROBIC AND ANAEROBIC 10 CC EACH  Final   Gram Stain   Final    Aerobic and  anaerobic bottles recovered Gram Positive Cocci Gram Stain Report Called to,Read Back By and Verified With: Bivens,L at 0850 by H Flynt 02/04/16    Culture METHICILLIN RESISTANT STAPHYLOCOCCUS AUREUS (A)  Final   Report Status 02/06/2016 FINAL  Final   Organism ID, Bacteria METHICILLIN RESISTANT STAPHYLOCOCCUS AUREUS  Final      Susceptibility   Methicillin resistant staphylococcus aureus - MIC*    CIPROFLOXACIN >=8 RESISTANT Resistant     ERYTHROMYCIN >=8 RESISTANT Resistant     GENTAMICIN <=0.5 SENSITIVE Sensitive     OXACILLIN >=4 RESISTANT Resistant     TETRACYCLINE <=1 SENSITIVE Sensitive     VANCOMYCIN <=0.5 SENSITIVE Sensitive     TRIMETH/SULFA <=10 SENSITIVE Sensitive     CLINDAMYCIN <=0.25 SENSITIVE Sensitive     RIFAMPIN <=0.5 SENSITIVE Sensitive     Inducible Clindamycin NEGATIVE Sensitive     * METHICILLIN RESISTANT STAPHYLOCOCCUS AUREUS  Gram stain     Status: None   Collection Time: 02/03/16  2:39 PM  Result Value Ref Range Status   Specimen Description PLEURAL LEFT  Final   Special Requests NONE  Final   Gram Stain   Final    CYTOSPIN SMEAR WBC PRESENT, PREDOMINANTLY MONONUCLEAR NO ORGANISMS SEEN Performed at [facility]    Report Status 02/04/2016 FINAL  Final  Culture, blood (Routine X 2) w Reflex to ID Panel     Status: None (Preliminary result)   Collection Time: 02/03/16  4:45 PM  Result Value Ref Range Status   Specimen Description BLOOD RIGHT HAND  Final   Special Requests BOTTLES DRAWN AEROBIC ONLY 6CC  Final   Culture NO GROWTH 4 DAYS  Final   Report Status PENDING  Incomplete  Culture, blood (Routine X 2) w Reflex to ID Panel     Status: None (Preliminary result)   Collection Time: 02/03/16  5:01 PM  Result Value Ref Range Status   Specimen Description BLOOD RIGHT HAND  Final   Special Requests BOTTLES DRAWN AEROBIC ONLY [place]  Final   Culture NO GROWTH 4 DAYS  Final   Report Status PENDING  Incomplete     Labs: BNP (last 3  results)  Recent Labs  12/21/15 1928 01/31/16 0312  BNP 33.0 517.0*   Basic Metabolic Panel:  Recent Labs Lab 02/02/16 0702 02/03/16 0642 02/04/16 0614 02/05/16 0551 02/07/16 0601  NA 134* 135 132* 130* 132*  K 2.9* 4.5 4.7 4.1 3.6  CL 98* 102  99* 96* 95*  CO2 '28 26 27 28 29  '$ GLUCOSE 104* 112* 109* 104* 140*  BUN 24* 27* 26* 29* 31*  CREATININE 0.81 0.77 0.91 1.06 1.10  CALCIUM 7.5* 7.9* 7.8* 7.7* 7.8*  MG 1.5*  --   --   --   --    Liver Function Tests:  Recent Labs Lab 02/04/16 0614  AST 62*  ALT 67*  ALKPHOS 98  BILITOT 0.3  PROT 5.3*  ALBUMIN 1.2*   No results for input(s): LIPASE, AMYLASE in the last 168 hours. No results for input(s): AMMONIA in the last 168 hours. CBC:  Recent Labs Lab 02/03/16 0642 02/04/16 0614 02/05/16 0551 02/06/16 0607 02/07/16 0913  WBC 17.5* 26.0* 28.8* 36.0* 33.7*  NEUTROABS  --   --   --  30.3*  --   HGB 11.5* 11.3* 10.2* 10.5* 9.9*  HCT 35.3* 35.1* 31.4* 31.8* 29.5*  MCV 79.1 79.4 78.9 77.6* 77.2*  PLT 183 237 238 281 330   Cardiac Enzymes:  Recent Labs Lab 01/31/16 1440  TROPONINI 0.04*   BNP: Invalid input(s): POCBNP CBG:  Recent Labs Lab 02/06/16 1146 02/06/16 1652 02/06/16 2044 02/07/16 0756 02/07/16 1134  GLUCAP 166* 139* 129* 114* 140*   D-Dimer No results for input(s): DDIMER in the last 72 hours. Hgb A1c No results for input(s): HGBA1C in the last 72 hours. Lipid Profile No results for input(s): CHOL, HDL, LDLCALC, TRIG, CHOLHDL, LDLDIRECT in the last 72 hours. Thyroid function studies No results for input(s): TSH, T4TOTAL, T3FREE, THYROIDAB in the last 72 hours.  Invalid input(s): FREET3 Anemia work up No results for input(s): VITAMINB12, FOLATE, FERRITIN, TIBC, IRON, RETICCTPCT in the last 72 hours. Urinalysis    Component Value Date/Time   COLORURINE YELLOW 01/26/2015 0047   APPEARANCEUR CLEAR 01/26/2015 0047   LABSPEC 1.018 01/26/2015 0047   PHURINE 5.5 01/26/2015 [place] 01/26/2015 0047   HGBUR NEGATIVE 01/26/2015 0047   BILIRUBINUR NEGATIVE 01/26/2015 0047   [place] 01/26/2015 0047   PROTEINUR NEGATIVE 01/26/2015 0047   UROBILINOGEN 0.2 01/26/2015 0047   NITRITE NEGATIVE 01/26/2015 0047   LEUKOCYTESUR NEGATIVE 01/26/2015 0047   Sepsis Labs Invalid input(s): PROCALCITONIN,  WBC,  LACTICIDVEN Microbiology Recent Results (from the past 240 hour(s))  Culture, body fluid-bottle     Status: Abnormal   Collection Time: 02/03/16  2:35 PM  Result Value Ref Range Status   Specimen Description PLEURAL LEFT  Final   Special Requests BOTTLES DRAWN AEROBIC AND ANAEROBIC 10 CC EACH  Final   Gram Stain   Final    Aerobic and anaerobic bottles recovered Gram Positive Cocci Gram Stain Report Called to,Read Back By and Verified With: Bivens,L at 0850 by H Flynt 02/04/16    Culture METHICILLIN RESISTANT STAPHYLOCOCCUS AUREUS (A)  Final   Report Status 02/06/2016 FINAL  Final   Organism ID, Bacteria METHICILLIN RESISTANT STAPHYLOCOCCUS AUREUS  Final      Susceptibility   Methicillin resistant staphylococcus aureus - MIC*    CIPROFLOXACIN >=8 RESISTANT Resistant     ERYTHROMYCIN >=8 RESISTANT Resistant     GENTAMICIN <=0.5 SENSITIVE Sensitive     OXACILLIN >=4 RESISTANT Resistant     TETRACYCLINE <=1 SENSITIVE Sensitive     VANCOMYCIN <=0.5 SENSITIVE Sensitive     TRIMETH/SULFA <=10 SENSITIVE Sensitive     CLINDAMYCIN <=0.25 SENSITIVE Sensitive     RIFAMPIN <=0.5 SENSITIVE Sensitive     Inducible Clindamycin NEGATIVE Sensitive     * METHICILLIN  RESISTANT STAPHYLOCOCCUS AUREUS  Gram stain     Status: None   Collection Time: 02/03/16  2:39 PM  Result Value Ref Range Status   Specimen Description PLEURAL LEFT  Final   Special Requests NONE  Final   Gram Stain   Final    CYTOSPIN SMEAR WBC PRESENT, PREDOMINANTLY MONONUCLEAR NO ORGANISMS SEEN Performed at [facility]    Report Status 02/04/2016 FINAL  Final  Culture, blood  (Routine X 2) w Reflex to ID Panel     Status: None (Preliminary result)   Collection Time: 02/03/16  4:45 PM  Result Value Ref Range Status   Specimen Description BLOOD RIGHT HAND  Final   Special Requests BOTTLES DRAWN AEROBIC ONLY [place]  Final   Culture NO GROWTH 4 DAYS  Final   Report Status PENDING  Incomplete  Culture, blood (Routine X 2) w Reflex to ID Panel     Status: None (Preliminary result)   Collection Time: 02/03/16  5:01 PM  Result Value Ref Range Status   Specimen Description BLOOD RIGHT HAND  Final   Special Requests BOTTLES DRAWN AEROBIC ONLY [place]  Final   Culture NO GROWTH 4 DAYS  Final   Report Status PENDING  Incomplete     Time coordinating discharge: Over 30 minutes  SIGNED:   Louellen Molder, MD  [facility] 02/07/2016, 12:30 PM Pager   If 7PM-7AM, please contact night-coverage [email or web address] Password [email or web address]

## 2016-02-07 NOTE — Progress Notes (Signed)
Pharmacy Antibiotic Note  Cameron Chandler is a 60 y.o. male admitted on 01/30/2016 with pneumonia.  Pharmacy has been consulted for Vancomycin and Cefepime dosing. Vancomycin level remains elevated this am.  Half life ~25 hours Plan:  Restart Vancomycin this evening at 1 gm IV q24 hours Cont Cefepime 1gm IV q8h Monitor labs, renal fxn, progress and c/s   Height: '5\' 6"'$  (167.6 cm) Weight: 181 lb 12.8 oz (82.5 kg) IBW/kg (Calculated) : 63.8  Temp (24hrs), Avg:98 F (36.7 C), Min:97.6 F (36.4 C), Max:98.4 F (36.9 C)   Recent Labs Lab 02/02/16 0702 02/03/16 0642 02/04/16 0614 02/05/16 0551 02/06/16 0607 02/06/16 1752 02/07/16 0601  WBC 11.8* 17.5* 26.0* 28.8* 36.0*  --   --   CREATININE 0.81 0.77 0.91 1.06  --   --  1.10  VANCOTROUGH  --   --   --   --  50* 35*  --   VANCORANDOM  --   --   --   --   --   --  26    Estimated Creatinine Clearance: 70.2 mL/min (by C-G formula based on SCr of 1.1 mg/dL).    Allergies  Allergen Reactions  . No Known Allergies    Antimicrobials this admission: Vancomycin 11/6 >>  Cefepime 11/6 >>   Dose adjustments this admission: 11/9 holding vancomycin 11/10 Restart vanc 1 gm IV q24 hours Microbiology results: 11/6 BZM:CEYE 11/6 pleural fluid: MRSA 9/21 MRSA PCR: positive   Thank you for allowing pharmacy to be a part of this patient's care. Excell Seltzer, PharmD 02/07/2016 8:59 AM

## 2016-02-07 NOTE — Care Management Note (Signed)
Case Management Note  Patient Details  Name: Cameron Chandler MRN: [id number] Date of Birth: 1955/06/15  Expected Discharge Date:    02/07/2016              Expected Discharge Plan:  [place]  In-House Referral:  NA  Discharge planning Services  CM Consult  Post Acute Care Choice:  Home Health, Resumption of Svcs/PTA Provider Choice offered to:  Patient  HH Arranged:  RN, IV Antibiotics HH Agency:  [facility]  Status of Service:  Completed, signed off   Additional Comments: Pt discharging home today. Pt will need IV abx, wound care and lovenox bridge. CM has discussed need with pt's daughter [name]. She understands and is willing to administer IV therapy and Lovenox injections. Pt will resume [place] services through [facility] for PT, wound care and IV therapy. Towanda Malkin, of [place], is aware of DC today and will obtain pt info. [facility] will provide IV therapy. Romualdo Bolk, of [facility] is aware of referral and will obtain pt info from chart. Pt will receive IV abx here today and will start home therapy tomorrow. Benefit check completed for Lovenox. Pt will have $8 co-pay and no PA. Pt aware of and agreeable to DC plan. Pt has RW and does not meet requirements for supplemental oxygen.    Sherald Barge, RN 02/07/2016, 1:21 PM

## 2016-02-08 LAB — CULTURE, BLOOD (ROUTINE X 2)
CULTURE: NO GROWTH
Culture: NO GROWTH

## 2016-02-12 ENCOUNTER — Encounter: Payer: Self-pay | Admitting: Physician Assistant

## 2016-02-12 ENCOUNTER — Ambulatory Visit (INDEPENDENT_AMBULATORY_CARE_PROVIDER_SITE_OTHER): Payer: Medicare HMO | Admitting: Physician Assistant

## 2016-02-12 ENCOUNTER — Encounter: Payer: Self-pay | Admitting: Vascular Surgery

## 2016-02-12 ENCOUNTER — Other Ambulatory Visit: Payer: Self-pay

## 2016-02-12 ENCOUNTER — Ambulatory Visit
Admission: RE | Admit: 2016-02-12 | Discharge: 2016-02-12 | Disposition: A | Discharge status: Home / Self Care | Payer: Medicare HMO | Source: Ambulatory Visit | Attending: Thoracic Surgery (Cardiothoracic Vascular Surgery) | Admitting: Thoracic Surgery (Cardiothoracic Vascular Surgery)

## 2016-02-12 VITALS — BP 116/72 | HR 72 | Temp 97.2°F | Resp 20 | Ht 66.0 in

## 2016-02-12 DIAGNOSIS — J910 Malignant pleural effusion: Secondary | ICD-10-CM | POA: Diagnosis not present

## 2016-02-12 DIAGNOSIS — Z978 Presence of other specified devices: Secondary | ICD-10-CM

## 2016-02-12 DIAGNOSIS — J90 Pleural effusion, not elsewhere classified: Secondary | ICD-10-CM

## 2016-02-12 DIAGNOSIS — Z9689 Presence of other specified functional implants: Secondary | ICD-10-CM

## 2016-02-12 NOTE — Progress Notes (Signed)
Cameron Chandler is a 60 y.o. male patient.  1. Pleural effusion, malignant   2. Chest tube in place   3. Pleural effusion, right    Past Medical History:  Diagnosis Date  . Adenocarcinoma of right lung, stage 4 ([place]) 12/03/2015  . Arthritis    back, joint pain  . Bone cancer ([place])    T 1T2  . Cancer [facility]) 2011   colon cancer  . Colon cancer [facility])    Colon resection and Chemotherapy  . Coronary atherosclerosis of native coronary artery   . Diabetes mellitus without complication ([place])    takes Meformin and Levemir daily  . DVT (deep venous thrombosis) ([facility])    Left arm  . Dyspnea    lying,sitting,exertion  . Heart attack   . Hypothyroidism    takes Synthroid daily  . Myocardial infarction 2007  . Nocturia   . Other and unspecified hyperlipidemia    takes Simvastatin daily  . Prostate cancer ([place]) 12/13/12   ,Radiation  . Seizures ([place])    hx of-had one over 20 yrs ago  . Tingling    hands  . Unspecified essential hypertension    takes Diovan and Metoprolol daily  . Weakness    in legs   No past surgical history pertinent negatives on file. Scheduled Meds: Current Outpatient Prescriptions on File Prior to Visit  Medication Sig Dispense Refill  . aspirin EC 81 MG tablet Take 81 mg by mouth daily.    Marland Kitchen dexamethasone (DECADRON) 4 MG tablet 4 mg by mouth twice a day the day before, day of and day after the chemotherapy every 3 weeks (Patient taking differently: Take 4 mg by mouth 2 (two) times daily. 4 mg by mouth twice a day the day before, day of and day after the chemotherapy every 3 weeks) 40 tablet 1  . enoxaparin (LOVENOX) 120 MG/0.8ML injection Inject 0.8 mLs (120 mg total) into the skin daily. 5 mL 0  . feeding supplement, ENSURE ENLIVE, (ENSURE ENLIVE) LIQD Take 237 mLs by mouth 2 (two) times daily between meals. 237 mL 12  . Fenofibric Acid 105 MG TABS Take 1 tablet by mouth daily.    . folic acid (FOLVITE) 1 MG tablet Take 1 tablet (1 mg total) by mouth daily. 30  tablet 4  . furosemide (LASIX) 40 MG tablet Take 1 tablet (40 mg total) by mouth daily. 14 tablet 1  . gabapentin (NEURONTIN) 100 MG capsule Take 1 capsule (100 mg total) by mouth 3 (three) times daily. (Patient taking differently: Take 300 mg by mouth 3 (three) times daily. ) 90 capsule 0  . hydrochlorothiazide (HYDRODIURIL) 25 MG tablet Take 25 mg by mouth daily.    Marland Kitchen HYDROcodone-acetaminophen (NORCO) 7.5-325 MG tablet Take 1 tablet by mouth 3 (three) times daily. 20 tablet 0  . insulin detemir (LEVEMIR) 100 unit/ml SOLN Inject 30 Units into the skin at bedtime.    Marland Kitchen levothyroxine (SYNTHROID, LEVOTHROID) 25 MCG tablet Take 1 tablet (25 mcg total) by mouth daily before breakfast. 30 tablet 11  . lidocaine-prilocaine (EMLA) cream Apply 1 application topically as needed. (Patient taking differently: Apply 1 application topically as needed (for port access). ) 30 g 0  . meloxicam (MOBIC) 7.5 MG tablet Take 7.5 mg by mouth daily.    . metFORMIN (GLUMETZA) 500 MG (MOD) 24 hr tablet Take 1,000 mg by mouth 2 (two) times daily with a meal.    . metoprolol tartrate (LOPRESSOR) 25 MG tablet Take 12.5  mg by mouth 2 (two) times daily.    . nitroGLYCERIN (NITROSTAT) 0.4 MG SL tablet Place 0.4 mg under the tongue every 5 (five) minutes as needed for chest pain.    . potassium chloride (K-DUR,KLOR-CON) 10 MEQ tablet Take 1 tablet (10 mEq total) by mouth daily. 14 tablet 1  . prochlorperazine (COMPAZINE) 10 MG tablet Take 1 tablet (10 mg total) by mouth every 6 (six) hours as needed for nausea or vomiting. 30 tablet 0  . simvastatin (ZOCOR) 80 MG tablet TAKE 1 TABLET BY MOUTH DAILY AT 6 P.M. 30 tablet 3  . valsartan (DIOVAN) 160 MG tablet Take 160 mg by mouth daily.    . vancomycin (VANCOCIN) 1-5 GM/200ML-% SOLN Inject 200 mLs (1,000 mg total) into the vein daily. 3000 mL 0  . warfarin (COUMADIN) 5 MG tablet Take 5 mg by mouth daily.   1  . Wound Dressings (SONAFINE) Apply 1 application topically daily.     No  current facility-administered medications on file prior to visit.     Allergies  Allergen Reactions  . No Known Allergies    Active Problems:   * No active hospital problems. *  Height '5\' 6"'$  (1.676 m).  Subjective: The patient presents to the clinic today with a complaint of increased swelling, tenderness, and bleeding at the pleurx catheter site.   Objective  Vitals:   02/12/16 1005  BP: 116/72  Pulse: 72  Resp: 20  Temp: 97.2 F (36.2 C)   Exam:  Cor: RRR, no murmur, gallop Lung: rhonchi in the left upper lobe, decreased breath sounds in bilateral lower and middle lobes Extremities: bilateral 2-3+ edema with leg wound wrapped in gauze pleurx site: red with yellow discharge around the site, well secured   Assessment & Plan:  Mr. Cameron Chandler chest xray was reviewed by myself and Dr. Cyndia Bent. He does have some increased in pleural fluid on the left compared to the previous exam, however the right is the same. He shares that the home health nurse is having a difficult time draining the right side since he came home from the hospital. He can likely get the pleurx catheter removed, however I will discuss this with Dr. Roxan Chandler first since it is his patient. As for the right side pleural fluid accumulation, he will need a repeat chest xray to further evaluate overtime if he will need a cathter placed on that side. He is on room air with stable oxygen saturation without complaint of shortness of breath.   Plan: Continue current medication. Continue full course of IV Vanc per primary for MRSA. Will discuss catheter removal with Dr. Roxan Chandler. Hopefully can schedule this later this week or early next week over in short stay.    [name] 02/12/2016

## 2016-02-14 ENCOUNTER — Encounter: Payer: Self-pay | Admitting: *Deleted

## 2016-02-14 NOTE — Progress Notes (Signed)
[facility] Psychosocial Distress Screening Clinical Social Work  Clinical Social Work was referred by distress screening protocol.  The patient scored a 8 on the Psychosocial Distress Thermometer which indicates severe distress. Clinical Social Worker contacted patient by phone to assess for distress and other psychosocial needs. Mr. Millett shared he recently discharged from the hospital and plans to see Dr. Julien Nordmann next week.  He reported no concerns at this time, he feels he is coping adequately and has support at home from daughter.  He shared his only concern is "removing this thing from my lung where they drew fluid"..  Patient plans to discuss with physician next week.  CSW briefly explain CSW role and encouraged patient to contact CSW for support as needed.  ONCBCN DISTRESS SCREENING 12/30/2015  Screening Type Initial Screening  Distress experienced in past week (1-10) 8  Emotional problem type Adjusting to illness  Physical Problem type Pain;Sleep/insomnia;Getting around;Breathing;Loss of appetitie;Swollen arms/legs;Tingling hands/feet  Physician notified of physical symptoms Yes  Referral to clinical psychology   Referral to clinical social work Yes  Referral to dietition Yes  Referral to financial advocate   Referral to support programs   Referral to palliative care     Polo Riley, MSW, LCSW, OSW-C Clinical Social Worker [place] [phone]

## 2016-02-17 ENCOUNTER — Encounter: Payer: Self-pay | Admitting: Internal Medicine

## 2016-02-17 ENCOUNTER — Other Ambulatory Visit: Payer: Self-pay | Admitting: Thoracic Surgery (Cardiothoracic Vascular Surgery)

## 2016-02-17 ENCOUNTER — Ambulatory Visit (HOSPITAL_BASED_OUTPATIENT_CLINIC_OR_DEPARTMENT_OTHER): Payer: Medicare HMO | Admitting: Internal Medicine

## 2016-02-17 ENCOUNTER — Telehealth: Payer: Self-pay | Admitting: Internal Medicine

## 2016-02-17 VITALS — BP 112/92 | HR 91 | Temp 97.8°F | Resp 17 | Ht 66.0 in | Wt 181.3 lb

## 2016-02-17 DIAGNOSIS — J189 Pneumonia, unspecified organism: Secondary | ICD-10-CM

## 2016-02-17 DIAGNOSIS — C7951 Secondary malignant neoplasm of bone: Secondary | ICD-10-CM | POA: Diagnosis not present

## 2016-02-17 DIAGNOSIS — I509 Heart failure, unspecified: Secondary | ICD-10-CM

## 2016-02-17 DIAGNOSIS — B9562 Methicillin resistant Staphylococcus aureus infection as the cause of diseases classified elsewhere: Secondary | ICD-10-CM

## 2016-02-17 DIAGNOSIS — C3491 Malignant neoplasm of unspecified part of right bronchus or lung: Secondary | ICD-10-CM

## 2016-02-17 DIAGNOSIS — C3411 Malignant neoplasm of upper lobe, right bronchus or lung: Secondary | ICD-10-CM | POA: Diagnosis not present

## 2016-02-17 DIAGNOSIS — Z5111 Encounter for antineoplastic chemotherapy: Secondary | ICD-10-CM

## 2016-02-17 DIAGNOSIS — A4902 Methicillin resistant Staphylococcus aureus infection, unspecified site: Secondary | ICD-10-CM

## 2016-02-17 DIAGNOSIS — C3490 Malignant neoplasm of unspecified part of unspecified bronchus or lung: Secondary | ICD-10-CM
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
## 2016-02-17 NOTE — Patient Instructions (Signed)
Steps to Quit Smoking Smoking tobacco can be bad for your health. It can also affect almost every organ in your body. Smoking puts you and people around you at risk for many serious long-lasting (chronic) diseases. Quitting smoking is hard, but it is one of the best things that you can do for your health. It is never too late to quit. What are the benefits of quitting smoking? When you quit smoking, you lower your risk for getting serious diseases and conditions. They can include:  Lung cancer or lung disease.  Heart disease.  Stroke.  Heart attack.  Not being able to have children (infertility).  Weak bones (osteoporosis) and broken bones (fractures). If you have coughing, wheezing, and shortness of breath, those symptoms may get better when you quit. You may also get sick less often. If you are pregnant, quitting smoking can help to lower your chances of having a baby of low birth weight. What can I do to help me quit smoking? Talk with your doctor about what can help you quit smoking. Some things you can do (strategies) include:  Quitting smoking totally, instead of slowly cutting back how much you smoke over a period of time.  Going to in-person counseling. You are more likely to quit if you go to many counseling sessions.  Using resources and support systems, such as:  Online chats with a counselor.  Phone quitlines.  Printed self-help materials.  Support groups or group counseling.  Text messaging programs.  Mobile phone apps or applications.  Taking medicines. Some of these medicines may have nicotine in them. If you are pregnant or breastfeeding, do not take any medicines to quit smoking unless your doctor says it is okay. Talk with your doctor about counseling or other things that can help you. Talk with your doctor about using more than one strategy at the same time, such as taking medicines while you are also going to in-person counseling. This can help make quitting  easier. What things can I do to make it easier to quit? Quitting smoking might feel very hard at first, but there is a lot that you can do to make it easier. Take these steps:  Talk to your family and friends. Ask them to support and encourage you.  Call phone quitlines, reach out to support groups, or work with a counselor.  Ask people who smoke to not smoke around you.  Avoid places that make you want (trigger) to smoke, such as:  Bars.  Parties.  Smoke-break areas at work.  Spend time with people who do not smoke.  Lower the stress in your life. Stress can make you want to smoke. Try these things to help your stress:  Getting regular exercise.  Deep-breathing exercises.  Yoga.  Meditating.  Doing a body scan. To do this, close your eyes, focus on one area of your body at a time from head to toe, and notice which parts of your body are tense. Try to relax the muscles in those areas.  Download or buy apps on your mobile phone or tablet that can help you stick to your quit plan. There are many free apps, such as QuitGuide from the CDC (Centers for Disease Control and Prevention). You can find more support from smokefree.gov and other websites. This information is not intended to replace advice given to you by your health care provider. Make sure you discuss any questions you have with your health care provider. Document Released: 01/10/2009 Document Revised: 11/12/2015 Document   Reviewed: 07/31/2014 Elsevier Interactive Patient Education  2017 [employer]  

## 2016-02-17 NOTE — Telephone Encounter (Signed)
Message sent to chemo scheduler to add chemo. Patient will start chemo on 02/24/16 as previously scheduled.  Appointments scheduled per 02/17/16 los. AVS and appointment schedule was given to patient, per 02/17/16 los.

## 2016-02-18 ENCOUNTER — Encounter: Payer: Self-pay | Admitting: Thoracic Surgery (Cardiothoracic Vascular Surgery)

## 2016-02-18 ENCOUNTER — Telehealth: Payer: Self-pay | Admitting: *Deleted

## 2016-02-18 ENCOUNTER — Ambulatory Visit
Admission: RE | Admit: 2016-02-18 | Discharge: 2016-02-18 | Disposition: A | Discharge status: Home / Self Care | Payer: Medicare HMO | Source: Ambulatory Visit | Attending: Thoracic Surgery (Cardiothoracic Vascular Surgery) | Admitting: Thoracic Surgery (Cardiothoracic Vascular Surgery)

## 2016-02-18 ENCOUNTER — Ambulatory Visit (INDEPENDENT_AMBULATORY_CARE_PROVIDER_SITE_OTHER): Payer: Medicare HMO | Admitting: Thoracic Surgery (Cardiothoracic Vascular Surgery)

## 2016-02-18 VITALS — BP 111/86 | HR 126 | Resp 16 | Ht 66.0 in | Wt 181.0 lb

## 2016-02-18 DIAGNOSIS — J910 Malignant pleural effusion: Secondary | ICD-10-CM

## 2016-02-18 DIAGNOSIS — Z9689 Presence of other specified functional implants: Secondary | ICD-10-CM

## 2016-02-18 DIAGNOSIS — Z978 Presence of other specified devices: Secondary | ICD-10-CM | POA: Diagnosis not present

## 2016-02-18 DIAGNOSIS — J90 Pleural effusion, not elsewhere classified: Secondary | ICD-10-CM

## 2016-02-18 DIAGNOSIS — C3490 Malignant neoplasm of unspecified part of unspecified bronchus or lung: Secondary | ICD-10-CM

## 2016-02-18 NOTE — Progress Notes (Signed)
[address]       [name] [address]             [phone]    HPI: Mr. Shimel follow-up of his right pleural catheter.  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 He also has a left pleural effusion, ascites and anasarca. I placed a right pleural catheter on 01/09/2016. It drained about a liter one placed. His drainage has decreased rapidly with the catheter in place. He was seen in the office last week and had not drained at all. He has not been draining since then.  We placed Unna boots on him for severe peripheral edema. Those were changed by home health nurse yesterday. That has helped dramatically.  Overall he feels better and is less short of breath.  Past Medical History:  Diagnosis Date  . Adenocarcinoma of right lung, stage 4 ([place]) 12/03/2015  . Arthritis    back, joint pain  . Bone cancer ([place])    T 1T2  . Cancer [facility]) 2011   colon cancer  . Colon cancer [facility])    Colon resection and Chemotherapy  . Coronary atherosclerosis of native coronary artery   . Diabetes mellitus without complication ([place])    takes Meformin and Levemir daily  . DVT (deep venous thrombosis) (HCC)    Left arm  . Dyspnea    lying,sitting,exertion  . Heart attack   . Hypothyroidism    takes Synthroid daily  . Myocardial infarction 2007  . Nocturia   . Other and unspecified hyperlipidemia    takes Simvastatin daily  . Prostate cancer ([place]) 12/13/12   ,Radiation  . Seizures ([place])    hx of-had one over 20 yrs ago  . Tingling    hands  . Unspecified essential hypertension    takes Diovan and Metoprolol daily  . Weakness    in legs      Current Outpatient Prescriptions  Medication Sig Dispense Refill  . dexamethasone (DECADRON) 4 MG tablet 4 mg by mouth twice a day the day before, day of and day after the chemotherapy every 3 weeks (Patient taking differently: Take 4 mg by mouth 2 (two) times daily. 4 mg by  mouth twice a day the day before, day of and day after the chemotherapy every 3 weeks) 40 tablet 1  . feeding supplement, ENSURE ENLIVE, (ENSURE ENLIVE) LIQD Take 237 mLs by mouth 2 (two) times daily between meals. 237 mL 12  . Fenofibric Acid 105 MG TABS Take 1 tablet by mouth daily.    . folic acid (FOLVITE) 1 MG tablet Take 1 tablet (1 mg total) by mouth daily. 30 tablet 4  . furosemide (LASIX) 40 MG tablet Take 1 tablet (40 mg total) by mouth daily. 14 tablet 1  . gabapentin (NEURONTIN) 100 MG capsule Take 1 capsule (100 mg total) by mouth 3 (three) times daily. (Patient taking differently: Take 300 mg by mouth 3 (three) times daily. ) 90 capsule 0  . hydrochlorothiazide (HYDRODIURIL) 25 MG tablet Take 25 mg by mouth daily.    Marland Kitchen HYDROcodone-acetaminophen (NORCO) 7.5-325 MG tablet Take 1 tablet by mouth 3 (three) times daily. 20 tablet 0  . insulin detemir (LEVEMIR) 100 unit/ml SOLN Inject 30 Units into the skin at bedtime.    Marland Kitchen levothyroxine (SYNTHROID, LEVOTHROID) 25 MCG tablet Take 1 tablet (25 mcg total) by mouth daily before breakfast. 30 tablet 11  . lidocaine-prilocaine (EMLA)  cream Apply 1 application topically as needed. (Patient taking differently: Apply 1 application topically as needed (for port access). ) 30 g 0  . meloxicam (MOBIC) 7.5 MG tablet Take 7.5 mg by mouth daily.    . metFORMIN (GLUMETZA) 500 MG (MOD) 24 hr tablet Take 1,000 mg by mouth 2 (two) times daily with a meal.    . metoprolol tartrate (LOPRESSOR) 25 MG tablet Take 12.5 mg by mouth 2 (two) times daily.    . potassium chloride (K-DUR,KLOR-CON) 10 MEQ tablet Take 1 tablet (10 mEq total) by mouth daily. 14 tablet 1  . prochlorperazine (COMPAZINE) 10 MG tablet Take 1 tablet (10 mg total) by mouth every 6 (six) hours as needed for nausea or vomiting. 30 tablet 0  . simvastatin (ZOCOR) 80 MG tablet TAKE 1 TABLET BY MOUTH DAILY AT 6 P.M. 30 tablet 3  . valsartan (DIOVAN) 160 MG tablet Take 160 mg by mouth daily.    .  vancomycin (VANCOCIN) 1-5 GM/200ML-% SOLN Inject 200 mLs (1,000 mg total) into the vein daily. 3000 mL 0  . Wound Dressings (SONAFINE) Apply 1 application topically daily.    Marland Kitchen aspirin EC 81 MG tablet Take 81 mg by mouth daily.    . nitroGLYCERIN (NITROSTAT) 0.4 MG SL tablet Place 0.4 mg under the tongue every 5 (five) minutes as needed for chest pain.    Marland Kitchen warfarin (COUMADIN) 5 MG tablet Take 5 mg by mouth daily.   1   No current facility-administered medications for this visit.     Physical Exam 60 year old man in no acute distress Alert and oriented 3 with no focal deficits Lungs diminished in both bases, left greater than right Cardiac regular rate and rhythm Abdomen slightly less distended Legs Unna boots in place  Diagnostic Tests: CHEST  2 VIEW  COMPARISON:  02/12/2016.  FINDINGS: PowerPort catheter noted with lead tip over the superior vena cava. Cardiomegaly with mild pulmonary venous congestion and bilateral interstitial prominence with bilateral pleural effusions consistent congestive heart failure. PleurX catheter noted in stable position over the right chest. Pleural effusions size is are stable. Low lung volumes with basilar atelectasis. No pneumothorax. Cervical spine fusion.  IMPRESSION: 1. PowerPort catheter and and PleurX catheter in stable position.  2. Changes consistent mild congestive heart failure with mild pulmonary interstitial edema and and bilateral pleural effusions. Pleural effusions size is are stable from prior exam.  3. Low lung volumes with basilar atelectasis.   Electronically Signed   By: Marcello Moores  Register   On: 02/18/2016 11:02 I personally reviewed the chest x-ray. His right effusion is loculated laterally and is unchanged.  Impression: 60 year old man with stage IV lung cancer with multiple issues. He has bilateral pleural effusions. Previously his right was greater than the left. He's had a pleural catheter in on the right  side. The drainage has been progressively decreasing from that catheter. It likely can be removed soon.  He does have a left pleural effusion. Does not service symptomatically present. Its any larger he'll need thoracentesis.  Peripheral edema- had progressive point that he had open weeping wounds. That has improved with the Unna boots in place. We will continue with those for a couple of more weeks.  Ascites- also appears slightly better.  Plan: Return in 2 weeks with PA and lateral chest x-ray. If chest x-ray stable will DC catheter  Continue Unna boots  Melrose Nakayama, MD [facility] [phone]

## 2016-02-18 NOTE — Telephone Encounter (Signed)
Per LOS I have scheduled apapts and notified the scheduler 

## 2016-02-19 ENCOUNTER — Encounter: Payer: Medicare HMO | Admitting: Vascular Surgery

## 2016-02-19 ENCOUNTER — Inpatient Hospital Stay (HOSPITAL_COMMUNITY): Admission: RE | Admit: 2016-02-19 | Payer: Medicare HMO | Source: Ambulatory Visit

## 2016-02-21 ENCOUNTER — Encounter (HOSPITAL_COMMUNITY): Payer: Self-pay | Admitting: *Deleted

## 2016-02-21 ENCOUNTER — Emergency Department (HOSPITAL_COMMUNITY): Payer: Medicare HMO

## 2016-02-21 ENCOUNTER — Inpatient Hospital Stay (HOSPITAL_COMMUNITY)
Admission: EM | Admit: 2016-02-21 | Discharge: 2016-02-25 | DRG: 291 | Disposition: A | Discharge status: Home Health | Payer: Medicare HMO | Attending: Family Medicine | Admitting: Family Medicine

## 2016-02-21 ENCOUNTER — Inpatient Hospital Stay (HOSPITAL_COMMUNITY): Payer: Medicare HMO

## 2016-02-21 DIAGNOSIS — Z794 Long term (current) use of insulin: Secondary | ICD-10-CM

## 2016-02-21 DIAGNOSIS — R188 Other ascites: Secondary | ICD-10-CM | POA: Diagnosis present

## 2016-02-21 DIAGNOSIS — I110 Hypertensive heart disease with heart failure: Secondary | ICD-10-CM | POA: Diagnosis present

## 2016-02-21 DIAGNOSIS — R140 Abdominal distension (gaseous): Secondary | ICD-10-CM

## 2016-02-21 DIAGNOSIS — Z79899 Other long term (current) drug therapy: Secondary | ICD-10-CM

## 2016-02-21 DIAGNOSIS — I82629 Acute embolism and thrombosis of deep veins of unspecified upper extremity: Secondary | ICD-10-CM | POA: Diagnosis present

## 2016-02-21 DIAGNOSIS — E119 Type 2 diabetes mellitus without complications: Secondary | ICD-10-CM | POA: Diagnosis present

## 2016-02-21 DIAGNOSIS — J9601 Acute respiratory failure with hypoxia: Secondary | ICD-10-CM | POA: Diagnosis present

## 2016-02-21 DIAGNOSIS — F17210 Nicotine dependence, cigarettes, uncomplicated: Secondary | ICD-10-CM | POA: Diagnosis not present

## 2016-02-21 DIAGNOSIS — C3491 Malignant neoplasm of unspecified part of right bronchus or lung: Secondary | ICD-10-CM

## 2016-02-21 DIAGNOSIS — Z7901 Long term (current) use of anticoagulants: Secondary | ICD-10-CM | POA: Diagnosis not present

## 2016-02-21 DIAGNOSIS — Z85038 Personal history of other malignant neoplasm of large intestine: Secondary | ICD-10-CM | POA: Diagnosis not present

## 2016-02-21 DIAGNOSIS — Z7984 Long term (current) use of oral hypoglycemic drugs: Secondary | ICD-10-CM | POA: Diagnosis not present

## 2016-02-21 DIAGNOSIS — E43 Unspecified severe protein-calorie malnutrition: Secondary | ICD-10-CM | POA: Diagnosis present

## 2016-02-21 DIAGNOSIS — Z833 Family history of diabetes mellitus: Secondary | ICD-10-CM | POA: Diagnosis not present

## 2016-02-21 DIAGNOSIS — I5031 Acute diastolic (congestive) heart failure: Secondary | ICD-10-CM | POA: Diagnosis present

## 2016-02-21 DIAGNOSIS — E785 Hyperlipidemia, unspecified: Secondary | ICD-10-CM | POA: Diagnosis present

## 2016-02-21 DIAGNOSIS — I2510 Atherosclerotic heart disease of native coronary artery without angina pectoris: Secondary | ICD-10-CM | POA: Diagnosis present

## 2016-02-21 DIAGNOSIS — Z86718 Personal history of other venous thrombosis and embolism: Secondary | ICD-10-CM

## 2016-02-21 DIAGNOSIS — Z9221 Personal history of antineoplastic chemotherapy: Secondary | ICD-10-CM | POA: Diagnosis not present

## 2016-02-21 DIAGNOSIS — I82622 Acute embolism and thrombosis of deep veins of left upper extremity: Secondary | ICD-10-CM

## 2016-02-21 DIAGNOSIS — I252 Old myocardial infarction: Secondary | ICD-10-CM | POA: Diagnosis not present

## 2016-02-21 DIAGNOSIS — R0600 Dyspnea, unspecified: Secondary | ICD-10-CM

## 2016-02-21 DIAGNOSIS — E876 Hypokalemia: Secondary | ICD-10-CM | POA: Diagnosis present

## 2016-02-21 DIAGNOSIS — R1084 Generalized abdominal pain: Secondary | ICD-10-CM | POA: Diagnosis not present

## 2016-02-21 DIAGNOSIS — R109 Unspecified abdominal pain: Secondary | ICD-10-CM

## 2016-02-21 DIAGNOSIS — J90 Pleural effusion, not elsewhere classified: Secondary | ICD-10-CM

## 2016-02-21 DIAGNOSIS — Z79891 Long term (current) use of opiate analgesic: Secondary | ICD-10-CM

## 2016-02-21 DIAGNOSIS — Z800 Family history of malignant neoplasm of digestive organs: Secondary | ICD-10-CM | POA: Diagnosis not present

## 2016-02-21 DIAGNOSIS — R0602 Shortness of breath: Secondary | ICD-10-CM | POA: Diagnosis present

## 2016-02-21 DIAGNOSIS — J15212 Pneumonia due to Methicillin resistant Staphylococcus aureus: Secondary | ICD-10-CM | POA: Diagnosis present

## 2016-02-21 DIAGNOSIS — Z923 Personal history of irradiation: Secondary | ICD-10-CM | POA: Diagnosis not present

## 2016-02-21 DIAGNOSIS — J910 Malignant pleural effusion: Secondary | ICD-10-CM | POA: Diagnosis present

## 2016-02-21 DIAGNOSIS — G40909 Epilepsy, unspecified, not intractable, without status epilepticus: Secondary | ICD-10-CM | POA: Diagnosis present

## 2016-02-21 DIAGNOSIS — Z8546 Personal history of malignant neoplasm of prostate: Secondary | ICD-10-CM | POA: Diagnosis not present

## 2016-02-21 DIAGNOSIS — I5033 Acute on chronic diastolic (congestive) heart failure: Secondary | ICD-10-CM | POA: Diagnosis not present

## 2016-02-21 DIAGNOSIS — J810 Acute pulmonary edema: Secondary | ICD-10-CM

## 2016-02-21 DIAGNOSIS — Z7982 Long term (current) use of aspirin: Secondary | ICD-10-CM

## 2016-02-21 DIAGNOSIS — E039 Hypothyroidism, unspecified: Secondary | ICD-10-CM | POA: Diagnosis present

## 2016-02-21 DIAGNOSIS — J811 Chronic pulmonary edema: Secondary | ICD-10-CM | POA: Diagnosis present

## 2016-02-21 LAB — COMPREHENSIVE METABOLIC PANEL
ALK PHOS: 86 U/L (ref 38–126)
ALT: 21 U/L (ref 17–63)
ANION GAP: 6 (ref 5–15)
AST: 30 U/L (ref 15–41)
Albumin: 1.7 g/dL — ABNORMAL LOW (ref 3.5–5.0)
BILIRUBIN TOTAL: 0.2 mg/dL — AB (ref 0.3–1.2)
BUN: 15 mg/dL (ref 6–20)
CALCIUM: 8.1 mg/dL — AB (ref 8.9–10.3)
CO2: 27 mmol/L (ref 22–32)
Chloride: 110 mmol/L (ref 101–111)
Creatinine, Ser: 0.88 mg/dL (ref 0.61–1.24)
GLUCOSE: 104 mg/dL — AB (ref 65–99)
POTASSIUM: 3 mmol/L — AB (ref 3.5–5.1)
Sodium: 143 mmol/L (ref 135–145)
TOTAL PROTEIN: 6.2 g/dL — AB (ref 6.5–8.1)

## 2016-02-21 LAB — TROPONIN I

## 2016-02-21 LAB — URINE MICROSCOPIC-ADD ON

## 2016-02-21 LAB — URINALYSIS, ROUTINE W REFLEX MICROSCOPIC
Bilirubin Urine: NEGATIVE
GLUCOSE, UA: NEGATIVE mg/dL
HGB URINE DIPSTICK: NEGATIVE
KETONES UR: NEGATIVE mg/dL
LEUKOCYTES UA: NEGATIVE
Nitrite: NEGATIVE
PROTEIN: 30 mg/dL — AB
Specific Gravity, Urine: 1.02 (ref 1.005–1.030)
pH: 6 (ref 5.0–8.0)

## 2016-02-21 LAB — CBC WITH DIFFERENTIAL/PLATELET
BASOS PCT: 0 %
Basophils Absolute: 0 10*3/uL (ref 0.0–0.1)
Eosinophils Absolute: 0.2 10*3/uL (ref 0.0–0.7)
Eosinophils Relative: 1 %
HEMATOCRIT: 28.4 % — AB (ref 39.0–52.0)
HEMOGLOBIN: 9 g/dL — AB (ref 13.0–17.0)
LYMPHS ABS: 0.9 10*3/uL (ref 0.7–4.0)
LYMPHS PCT: 4 %
MCH: 25.4 pg — AB (ref 26.0–34.0)
MCHC: 31.7 g/dL (ref 30.0–36.0)
MCV: 80 fL (ref 78.0–100.0)
MONO ABS: 1 10*3/uL (ref 0.1–1.0)
MONOS PCT: 5 %
NEUTROS ABS: 19.1 10*3/uL — AB (ref 1.7–7.7)
NEUTROS PCT: 90 %
Platelets: 547 10*3/uL — ABNORMAL HIGH (ref 150–400)
RBC: 3.55 MIL/uL — ABNORMAL LOW (ref 4.22–5.81)
RDW: 22.5 % — AB (ref 11.5–15.5)
WBC: 21.2 10*3/uL — ABNORMAL HIGH (ref 4.0–10.5)

## 2016-02-21 LAB — I-STAT TROPONIN, ED: Troponin i, poc: 0.02 ng/mL (ref 0.00–0.08)

## 2016-02-21 LAB — GLUCOSE, CAPILLARY
GLUCOSE-CAPILLARY: 90 mg/dL (ref 65–99)
GLUCOSE-CAPILLARY: 97 mg/dL (ref 65–99)

## 2016-02-21 LAB — MRSA PCR SCREENING: MRSA BY PCR: POSITIVE — AB

## 2016-02-21 LAB — LACTIC ACID, PLASMA: Lactic Acid, Venous: 0.9 mmol/L (ref 0.5–1.9)

## 2016-02-21 LAB — PROTIME-INR
INR: 1.52
PROTHROMBIN TIME: 18.5 s — AB (ref 11.4–15.2)

## 2016-02-21 LAB — BRAIN NATRIURETIC PEPTIDE: B NATRIURETIC PEPTIDE 5: 158 pg/mL — AB (ref 0.0–100.0)

## 2016-02-21 MED ORDER — FUROSEMIDE 10 MG/ML IJ SOLN: 40.0000 mg | Freq: Once | INTRAMUSCULAR | Status: DC

## 2016-02-21 MED ORDER — ENSURE ENLIVE PO LIQD
237.0000 mL | Freq: Two times a day (BID) | ORAL | Status: DC
  Administered 2016-02-22 – 2016-02-25 (×6): 237 mL via ORAL

## 2016-02-21 MED ORDER — LEVOTHYROXINE SODIUM 25 MCG PO TABS
25.0000 ug | ORAL_TABLET | Freq: Every day | ORAL | Status: DC
  Administered 2016-02-22 – 2016-02-25 (×4): 25 ug via ORAL
  Filled 2016-02-21 (×4): qty 1

## 2016-02-21 MED ORDER — INSULIN ASPART 100 UNIT/ML ~~LOC~~ SOLN: 0.0000 [IU] | Freq: Every day | SUBCUTANEOUS | Status: DC

## 2016-02-21 MED ORDER — ACETAMINOPHEN 325 MG PO TABS
650.0000 mg | ORAL_TABLET | Freq: Four times a day (QID) | ORAL | Status: DC | PRN
  Administered 2016-02-22 – 2016-02-25 (×4): 650 mg via ORAL
  Filled 2016-02-21 (×4): qty 2

## 2016-02-21 MED ORDER — GABAPENTIN 300 MG PO CAPS
300.0000 mg | ORAL_CAPSULE | Freq: Three times a day (TID) | ORAL | Status: DC
  Administered 2016-02-21 – 2016-02-25 (×13): 300 mg via ORAL
  Filled 2016-02-21 (×13): qty 1

## 2016-02-21 MED ORDER — NITROGLYCERIN 0.4 MG SL SUBL: 0.4000 mg | SUBLINGUAL_TABLET | SUBLINGUAL | Status: DC | PRN

## 2016-02-21 MED ORDER — ASPIRIN EC 81 MG PO TBEC
81.0000 mg | DELAYED_RELEASE_TABLET | Freq: Every day | ORAL | Status: DC
  Administered 2016-02-21 – 2016-02-25 (×5): 81 mg via ORAL
  Filled 2016-02-21 (×5): qty 1

## 2016-02-21 MED ORDER — ACETAMINOPHEN 650 MG RE SUPP: 650.0000 mg | Freq: Four times a day (QID) | RECTAL | Status: DC | PRN

## 2016-02-21 MED ORDER — HYDROCODONE-ACETAMINOPHEN 7.5-325 MG PO TABS
1.0000 | ORAL_TABLET | Freq: Three times a day (TID) | ORAL | Status: DC
Start: 2016-02-21 — End: 2016-02-25
  Administered 2016-02-21 – 2016-02-25 (×13): 1 via ORAL
  Filled 2016-02-21 (×13): qty 1

## 2016-02-21 MED ORDER — FUROSEMIDE 10 MG/ML IJ SOLN
60.0000 mg | Freq: Once | INTRAMUSCULAR | Status: AC
  Administered 2016-02-21: 60 mg via INTRAVENOUS
  Filled 2016-02-21: qty 6

## 2016-02-21 MED ORDER — VANCOMYCIN HCL IN DEXTROSE 1-5 GM/200ML-% IV SOLN
1000.0000 mg | INTRAVENOUS | Status: AC
  Administered 2016-02-21: 1000 mg via INTRAVENOUS
  Filled 2016-02-21: qty 200

## 2016-02-21 MED ORDER — FUROSEMIDE 10 MG/ML IJ SOLN
40.0000 mg | Freq: Two times a day (BID) | INTRAMUSCULAR | Status: DC
  Administered 2016-02-21 – 2016-02-23 (×4): 40 mg via INTRAVENOUS
  Filled 2016-02-21 (×4): qty 4

## 2016-02-21 MED ORDER — INSULIN ASPART 100 UNIT/ML ~~LOC~~ SOLN
0.0000 [IU] | Freq: Three times a day (TID) | SUBCUTANEOUS | Status: DC
  Administered 2016-02-22: 3 [IU] via SUBCUTANEOUS
  Administered 2016-02-22: 2 [IU] via SUBCUTANEOUS
  Administered 2016-02-23: 1 [IU] via SUBCUTANEOUS
  Administered 2016-02-23: 9 [IU] via SUBCUTANEOUS
  Administered 2016-02-24: 2 [IU] via SUBCUTANEOUS
  Administered 2016-02-25: 3 [IU] via SUBCUTANEOUS
  Administered 2016-02-25: 1 [IU] via SUBCUTANEOUS

## 2016-02-21 MED ORDER — SODIUM CHLORIDE 0.9% FLUSH
3.0000 mL | Freq: Two times a day (BID) | INTRAVENOUS | Status: DC
  Administered 2016-02-21 – 2016-02-23 (×5): 3 mL via INTRAVENOUS
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
## 2016-02-21 NOTE — ED Notes (Signed)
Admitting doctor at bedside 
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
## 2016-02-21 NOTE — Procedures (Signed)
PreOperative Dx: LEFT pleural effusion Postoperative Dx: LEFT pleural effusion Procedure:   US guided LEFT thoracentesis Radiologist:  Thornton Papas Anesthesia:  10 ml of 1% lidocaine Specimen:  1.52 L of cloudy red colored fluid EBL:   < 1 ml Complications: None immediate - CXR pending

## 2016-02-21 NOTE — ED Triage Notes (Signed)
Pt from home with c/o SOB and left arm swelling that started last night. Pt has stage 4 lung CA with chest tube in place on right side. Pt reports he has MRSA in the right lung and he is receiving anbx currently to treat it, today was supposed to be his last dose. Pt reports blood clot in his left arm with swelling but reports the swelling has increased significantly this morning. Home RN called EMS reporting pt's O2 sats were 84% on RA. EMS placed pt on NRB and sats increased to 96%.

## 2016-02-22 LAB — TROPONIN I: Troponin I: <0.03 ng/mL (ref <0.03)

## 2016-02-22 LAB — PROTIME-INR
INR: 1.38
Prothrombin Time: 17.1 seconds — ABNORMAL HIGH (ref 11.4–15.2)

## 2016-02-22 LAB — BASIC METABOLIC PANEL
Anion gap: 7 (ref 5–15)
BUN: 17 mg/dL (ref 6–20)
CHLORIDE: 106 mmol/L (ref 101–111)
CO2: 29 mmol/L (ref 22–32)
Calcium: 8 mg/dL — ABNORMAL LOW (ref 8.9–10.3)
Creatinine, Ser: 0.98 mg/dL (ref 0.61–1.24)
GFR calc non Af Amer: >60 mL/min (ref >60)
Glucose, Bld: 101 mg/dL — ABNORMAL HIGH (ref 65–99)
POTASSIUM: 2.6 mmol/L — AB (ref 3.5–5.1)
SODIUM: 142 mmol/L (ref 135–145)

## 2016-02-22 LAB — CBC
HEMATOCRIT: 27.6 % — AB (ref 39.0–52.0)
HEMOGLOBIN: 8.6 g/dL — AB (ref 13.0–17.0)
MCH: 25.1 pg — ABNORMAL LOW (ref 26.0–34.0)
MCHC: 31.2 g/dL (ref 30.0–36.0)
MCV: 80.5 fL (ref 78.0–100.0)
Platelets: 502 10*3/uL — ABNORMAL HIGH (ref 150–400)
RBC: 3.43 MIL/uL — AB (ref 4.22–5.81)
RDW: 22.9 % — AB (ref 11.5–15.5)
WBC: 18 10*3/uL — AB (ref 4.0–10.5)

## 2016-02-22 LAB — GLUCOSE, CAPILLARY
GLUCOSE-CAPILLARY: 147 mg/dL — AB (ref 65–99)
GLUCOSE-CAPILLARY: 154 mg/dL — AB (ref 65–99)
Glucose-Capillary: 91 mg/dL (ref 65–99)

## 2016-02-22 MED ORDER — INSULIN ASPART 100 UNIT/ML ~~LOC~~ SOLN: 10.0000 [IU] | Freq: Once | SUBCUTANEOUS | Status: DC

## 2016-02-22 MED ORDER — WARFARIN - PHARMACIST DOSING INPATIENT
Status: DC
  Administered 2016-02-22 – 2016-02-23 (×2)
  Administered 2016-02-24: 1
  Administered 2016-02-25: 16:00:00

## 2016-02-22 MED ORDER — WARFARIN SODIUM 5 MG PO TABS
5.0000 mg | ORAL_TABLET | Freq: Once | ORAL | Status: AC
  Administered 2016-02-22: 5 mg via ORAL
  Filled 2016-02-22: qty 1

## 2016-02-22 MED ORDER — POTASSIUM CHLORIDE CRYS ER 20 MEQ PO TBCR
40.0000 meq | EXTENDED_RELEASE_TABLET | ORAL | Status: AC
  Administered 2016-02-22 (×4): 40 meq via ORAL
  Filled 2016-02-22 (×4): qty 2

## 2016-02-22 NOTE — Progress Notes (Signed)
ANTICOAGULATION CONSULT NOTE - Initial Consult  Pharmacy Consult for Coumadin Indication: DVT  Allergies  Allergen Reactions  . No Known Allergies     Patient Measurements: Height: '5\' 6"'$  (167.6 cm) Weight: 174 lb 13.2 oz (79.3 kg) IBW/kg (Calculated) : 63.8   Vital Signs: Temp: 96.4 F (35.8 C) (11/25 0400) Temp Source: Oral (11/25 0400) BP: 100/79 (11/25 0900) Pulse Rate: 90 (11/25 0900)  Labs:  Recent Labs  02/21/16 1134 02/21/16 1829 02/21/16 2342 02/22/16 0751  HGB 9.0*  --   --  8.6*  HCT 28.4*  --   --  27.6*  PLT 547*  --   --  502*  LABPROT 18.5*  --   --  17.1*  INR 1.52  --   --  1.38  CREATININE 0.88  --   --  0.98  TROPONINI  --  <0.03 <0.03 <0.03    Estimated Creatinine Clearance: 77.4 mL/min (by C-G formula based on SCr of 0.98 mg/dL).   Medical History: Past Medical History:  Diagnosis Date  . Adenocarcinoma of right lung, stage 4 ([place]) 12/03/2015  . Arthritis    back, joint pain  . Bone cancer ([place])    T 1T2  . Cancer [facility]) 2011   colon cancer  . Colon cancer [facility])    Colon resection and Chemotherapy  . Coronary atherosclerosis of native coronary artery   . Diabetes mellitus without complication ([place])    takes Meformin and Levemir daily  . DVT (deep venous thrombosis) (HCC)    Left arm  . Dyspnea    lying,sitting,exertion  . Heart attack   . Hypothyroidism    takes Synthroid daily  . Myocardial infarction 2007  . Nocturia   . Other and unspecified hyperlipidemia    takes Simvastatin daily  . Prostate cancer ([place]) 12/13/12   ,Radiation  . Seizures ([place])    hx of-had one over 20 yrs ago  . Tingling    hands  . Unspecified essential hypertension    takes Diovan and Metoprolol daily  . Weakness    in legs    Medications:  Prescriptions Prior to Admission  Medication Sig Dispense Refill Last Dose  . aspirin EC 81 MG tablet Take 81 mg by mouth daily.   Past Month at Unknown time  . dexamethasone (DECADRON) 4 MG tablet 4 mg by  mouth twice a day the day before, day of and day after the chemotherapy every 3 weeks (Patient taking differently: Take 4 mg by mouth 2 (two) times daily. 4 mg by mouth twice a day the day before, day of and day after the chemotherapy every 3 weeks) 40 tablet 1 Past Week at Unknown time  . feeding supplement, ENSURE ENLIVE, (ENSURE ENLIVE) LIQD Take 237 mLs by mouth 2 (two) times daily between meals. 237 mL 12 Past Month at Unknown time  . Fenofibric Acid 105 MG TABS Take 1 tablet by mouth daily.   Past Week at Unknown time  . folic acid (FOLVITE) 1 MG tablet Take 1 tablet (1 mg total) by mouth daily. 30 tablet 4 Past Week at Unknown time  . furosemide (LASIX) 40 MG tablet Take 1 tablet (40 mg total) by mouth daily. 14 tablet 1 Past Week at Unknown time  . gabapentin (NEURONTIN) 100 MG capsule Take 1 capsule (100 mg total) by mouth 3 (three) times daily. (Patient taking differently: Take 300 mg by mouth 3 (three) times daily. ) 90 capsule 0 Past Week at Unknown time  .  hydrochlorothiazide (HYDRODIURIL) 25 MG tablet Take 25 mg by mouth daily.   Past Week at Unknown time  . HYDROcodone-acetaminophen (NORCO) 7.5-325 MG tablet Take 1 tablet by mouth 3 (three) times daily. 20 tablet 0 02/21/2016 at Unknown time  . insulin detemir (LEVEMIR) 100 unit/ml SOLN Inject 30 Units into the skin at bedtime.   Past Week at Unknown time  . levothyroxine (SYNTHROID, LEVOTHROID) 25 MCG tablet Take 1 tablet (25 mcg total) by mouth daily before breakfast. 30 tablet 11 Past Week at Unknown time  . lidocaine-prilocaine (EMLA) cream Apply 1 application topically as needed. (Patient taking differently: Apply 1 application topically as needed (for port access). ) 30 g 0 Past Month at Unknown time  . metFORMIN (GLUMETZA) 500 MG (MOD) 24 hr tablet Take 1,000 mg by mouth 2 (two) times daily with a meal.   Past Week at Unknown time  . metoprolol tartrate (LOPRESSOR) 25 MG tablet Take 12.5 mg by mouth 2 (two) times daily.   Past Week  at Unknown time  . potassium chloride (K-DUR,KLOR-CON) 10 MEQ tablet Take 1 tablet (10 mEq total) by mouth daily. 14 tablet 1 Past Week at Unknown time  . prochlorperazine (COMPAZINE) 10 MG tablet Take 1 tablet (10 mg total) by mouth every 6 (six) hours as needed for nausea or vomiting. 30 tablet 0 Past Week at Unknown time  . simvastatin (ZOCOR) 80 MG tablet TAKE 1 TABLET BY MOUTH DAILY AT 6 P.M. 30 tablet 3 Past Week at Unknown time  . valsartan (DIOVAN) 160 MG tablet Take 160 mg by mouth daily.   Past Week at Unknown time  . [EXPIRED] vancomycin (VANCOCIN) 1-5 GM/200ML-% SOLN Inject 200 mLs (1,000 mg total) into the vein daily. 3000 mL 0 Past Month at Unknown time  . warfarin (COUMADIN) 5 MG tablet Take 5 mg by mouth daily.   1 Past Week at Unknown time  . Wound Dressings (SONAFINE) Apply 1 application topically daily.   Past Week at Unknown time  . meloxicam (MOBIC) 7.5 MG tablet Take 7.5 mg by mouth daily.    at unknown  . nitroGLYCERIN (NITROSTAT) 0.4 MG SL tablet Place 0.4 mg under the tongue every 5 (five) minutes as needed for chest pain.    at unknown    Assessment: Continuation of Coumadin PTA for DVT in LUE PTA Coumadin dose 5 mg daily Coumadin held on admission until after thoracentesis  Goal of Therapy:  INR 2-3 Monitor platelets by anticoagulation protocol: Yes   Plan:  Coumadin 5 mg po x 1 dose today INR/PT daily CBC daily Monitor for sign of bleeding  Abner Greenspan, Kenzel Ruesch Bennett 02/22/2016,9:56 AM

## 2016-02-22 NOTE — Progress Notes (Signed)
PROGRESS NOTE  Cameron Chandler CBJ:[id number] DOB: 10/28/1955 DOA: 02/21/2016 PCP: [name], MD  Brief Narrative: 60 year old man PMH lung cancer currently undergoing chemotherapy, malignant right pleural effusion status post pleural catheter placement, recurrent left pleural effusion, chronic diastolic heart failure, presented to the emergency department with sudden onset of shortness of breath. Initial evaluation suggested acute on chronic diastolic heart failure, acute hypoxic respiratory failure initially requiring facemask and left pleural effusion.  Assessment/Plan: 1. Acute hypoxic respiratory failure secondary to acute heart failure, left pleural effusion. Much improved status post thoracentesis 11/24. -5.5 L since admission. 2. Acute on chronic diastolic congestive heart failure, improving. Adequate diuresis thus far. 3. Left pleural effusion, recurrent; much improved status post thoracentesis. 4. MRSA pneumonia present on admission, completed treatment 11/24 5. 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 Has follow-up with thoracic surgery mid December. Currently undergoing chemotherapy, for cycle October 16. 6. Diabetes mellitus type 2 on metformin, Levaquin there. Consider risk/benefit metformin as an outpatient. Blood sugars stable. 7. Chronic severe lower extremity edema, [place] boots placed as an outpatient. 8. PMH seizure disorder, remote; colon cancer, left upper extremity DVT currently on anticoagulation. 9. Tobacco use disorder, cigarettes, ongoing   Dramatically improved today. Plan to continue IV diuresis. Transfer to telemetry today. Wean off oxygen. If continues to improve, anticipate discharge 11/26.  Replace potassium  DVT prophylaxis: warfarin Code Status: full code Family Communication:  Disposition Plan: home  Murray Hodgkins, MD  [facility] Direct contact:  [phone] --[place]  --[email or web address]; password [email or web address]  7PM-7AM contact night coverage as above 02/22/2016, 8:58 AM  LOS: 1 day   Consultants:  Radiology  Procedures:  Ultrasound-guided left thoracentesis, 1.5 to liters cloudy red colored fluid removed.  Antimicrobials:  Vancomycin 11/25  Interval history/Subjective: Feeling much better. Breathing much better. Slept well last night. No complaints.  Objective: Vitals:   02/22/16 0400 02/22/16 0500 02/22/16 0700 02/22/16 0800  BP:   110/85 126/84  Pulse:   85 (!) 109  Resp:   16 (!) 23  Temp: (!) 96.4 F (35.8 C)     TempSrc: Oral     SpO2:   99% 95%  Weight:  79.3 kg (174 lb 13.2 oz)    Height:        Intake/Output Summary (Last 24 hours) at 02/22/16 0858 Last data filed at 02/22/16 0700  Gross per 24 hour  Intake              480 ml  Output             1025 ml  Net             -545 ml     Filed Weights   02/21/16 1101 02/21/16 1700 02/22/16 0500  Weight: 82.1 kg (181 lb) 80.2 kg (176 lb 12.9 oz) 79.3 kg (174 lb 13.2 oz)    Exam:    Constitutional:  . Appears calm and comfortable ENMT:  . grossly normal hearing  Respiratory:  . CTA bilaterally, no w/r/r.  . Respiratory effort normal.  Cardiovascular:  . RRR, no m/r/g . No LE extremity edema   . Telemetry ST Psychiatric:  . judgement and insight appear normal . Mental status o Mood, affect appropriate  I have personally reviewed following labs and imaging studies:  Troponins negative  Potassium 2.6  Remainder BMP unremarkable  WBC down to 18.0  Hemoglobin 8.6, stable  Repeat chest x-ray no pneumothorax following left thoracentesis. Left pleural effusion has decreased in size.  Scheduled Meds: . aspirin EC  81 mg Oral Daily  . feeding supplement (ENSURE ENLIVE)  237 mL Oral BID BM  . furosemide  40 mg Intravenous Q12H  . gabapentin  300 mg Oral TID  . HYDROcodone-acetaminophen  1 tablet Oral TID  . insulin aspart  0-5 Units  Subcutaneous QHS  . insulin aspart  0-9 Units Subcutaneous TID WC  . levothyroxine  25 mcg Oral QAC breakfast  . sodium chloride flush  3 mL Intravenous Q12H   Continuous Infusions:  Principal Problem:   Acute on chronic diastolic CHF (congestive heart failure) (HCC) Active Problems:   Cigarette smoker   Adenocarcinoma of right lung, stage 4 (HCC)   Acute respiratory failure with hypoxia (HCC)   DVT of upper extremity (deep vein thrombosis) ([place])   Long term current use of anticoagulant therapy   Pulmonary edema   LOS: 1 day

## 2016-02-22 NOTE — Progress Notes (Signed)
Patient a/o.vss. Dressings to bilateral lower legs intact and changed today. No complaints of any distress. Family visiting at the bedside. Report given to C.Morris, Therapist, sports. Pt to be transferred to room 334 via wheelchair with nursing staff.

## 2016-02-22 NOTE — Progress Notes (Signed)
CRITICAL VALUE ALERT  Critical value received:  Potassium 2.6  Date of notification:  02/22/16  Time of notification:  0830  Critical value read back:yes  Nurse who received alert:  Hall Busing RN  MD notified (1st page):  Dr. Sarajane Jews  Time of first page:  [phone]  MD notified (2nd page):  Time of second page:  Responding MD:  Dr. Sarajane Jews  Time MD responded:  0900

## 2016-02-22 NOTE — Progress Notes (Signed)
Initial Nutrition Assessment  DOCUMENTATION CODES:  Severe malnutrition in context of chronic illness  INTERVENTION:  Ensure Enlive po TID, each supplement provides 350 kcal and 20 grams of protein   Will order 30 mL Prostat BID, each supplement provides 100 kcal and 15 grams of protein.  If still admitted next week, can follow up and provide diet wt maintenance education  NUTRITION DIAGNOSIS:  Malnutrition related to cancer and cancer related treatments as evidenced by severe depletion of muscle mass, loss of ATLEAST 10% bw in last 2 months.   GOAL:  Patient will meet greater than or equal to 90% of their needs   MONITOR:  PO intake, Supplement acceptance, Labs, Weight trends, I & O's  REASON FOR ASSESSMENT:  Malnutrition Screening Tool    ASSESSMENT:  60 y/o male PMHx HTN, HLD, MI, DM, CAD, HF and Stage 4 lung cancer w/ malignant pleural effuspion s/p pleural cath placement, currently undergoing chemotherapy. Presented with sudden SOB. Worked up for acute resp failure related to HF and complicated by pleural effusion.   RD operating remotely. Per ED MD note, pt denied any n/v/d/c. Only complained of SOB, unproductive cough, abdominal pain and increased arm swelling.   Per chart review, pt started Chemo October 16th. . He appears to have lost an astounding amount of weight in that time. In the interval he had MRSA PNA.   During this admission, had 1.52 L thoracentesis. Significantly diuresed as well.   There is no documented PO intake from this admission at this time.   He was documented by RD as having Severe malnutrition on 11/3 secondary to energy intake < or equal to 75% for > or equal to 1 month and severe depletion of muscle mass. Given the fact that pt has lost another 7 lbs since that time, feel justified continuing severe malnutrition diagnosis.   Medications: Ensure Enlive BID, Lasix, hydrocodone, kcl, insulin, warfarin.  Labs: Hypokalemic, wbc:18.0, h/h:8.6/27.6.  Albumin: 1.7,    Recent Labs Lab 02/21/16 1134 02/22/16 0751  NA 143 142  K 3.0* 2.6*  CL 110 106  CO2 27 29  BUN 15 17  CREATININE 0.88 0.98  CALCIUM 8.1* 8.0*  GLUCOSE 104* 101*   Diet Order:  Diet heart healthy/carb modified Room service appropriate? Yes; Fluid consistency: Thin  Skin:  PU stage 2 to buttocks, Diabetic ulcer to L ankle, MSAD to buttocks; coccyx. Cracking to to bilateral legs, blister on lip, Diabetic ulcer to R ankle and leg  Last BM:  11/24  Height:  Ht Readings from Last 1 Encounters:  02/21/16 '5\' 6"'$  (1.676 m)   Weight:  Wt Readings from Last 1 Encounters:  02/22/16 174 lb 13.2 oz (79.3 kg)   Wt Readings from Last 10 Encounters:  02/22/16 174 lb 13.2 oz (79.3 kg)  02/18/16 181 lb (82.1 kg)  02/17/16 181 lb 4.8 oz (82.2 kg)  02/07/16 181 lb 12.8 oz (82.5 kg)  01/28/16 211 lb (95.7 kg)  01/15/16 211 lb (95.7 kg)  01/09/16 212 lb (96.2 kg)  01/01/16 212 lb 6.4 oz (96.3 kg)  12/31/15 206 lb (93.4 kg)  12/30/15 212 lb (96.2 kg)   Ideal Body Weight:  64.54 kg  BMI:  Body mass index is 28.22 kg/m.  Estimated Nutritional Needs:  Kcal:  2000-2200 kcals (25-28 kcal/kg bw) Protein:  120-135g Pro (1.5-1.7 g Pro/kg bw)  Fluid:  Per MD  EDUCATION NEEDS:  No education needs identified at this time   Burtis Junes RD, LDN, [place]  Clinical Nutrition Pager: [phone] 02/22/2016 3:20 PM

## 2016-02-23 DIAGNOSIS — J90 Pleural effusion, not elsewhere classified: Secondary | ICD-10-CM

## 2016-02-23 LAB — BASIC METABOLIC PANEL
Anion gap: 6 (ref 5–15)
BUN: 20 mg/dL (ref 6–20)
CALCIUM: 8 mg/dL — AB (ref 8.9–10.3)
CHLORIDE: 105 mmol/L (ref 101–111)
CO2: 27 mmol/L (ref 22–32)
CREATININE: 0.95 mg/dL (ref 0.61–1.24)
GFR calc non Af Amer: >60 mL/min (ref >60)
Glucose, Bld: 103 mg/dL — ABNORMAL HIGH (ref 65–99)
Potassium: 3.8 mmol/L (ref 3.5–5.1)
SODIUM: 138 mmol/L (ref 135–145)

## 2016-02-23 LAB — CBC
HEMATOCRIT: 27.7 % — AB (ref 39.0–52.0)
HEMOGLOBIN: 8.7 g/dL — AB (ref 13.0–17.0)
MCH: 25.5 pg — AB (ref 26.0–34.0)
MCHC: 31.4 g/dL (ref 30.0–36.0)
MCV: 81.2 fL (ref 78.0–100.0)
Platelets: 505 10*3/uL — ABNORMAL HIGH (ref 150–400)
RBC: 3.41 MIL/uL — ABNORMAL LOW (ref 4.22–5.81)
RDW: 23.1 % — AB (ref 11.5–15.5)
WBC: 19.4 10*3/uL — ABNORMAL HIGH (ref 4.0–10.5)

## 2016-02-23 LAB — PROTIME-INR
INR: 1.37
PROTHROMBIN TIME: 16.9 s — AB (ref 11.4–15.2)

## 2016-02-23 LAB — URINE CULTURE: Culture: <10000 — AB

## 2016-02-23 LAB — GLUCOSE, CAPILLARY
Glucose-Capillary: 102 mg/dL — ABNORMAL HIGH (ref 65–99)
Glucose-Capillary: 137 mg/dL — ABNORMAL HIGH (ref 65–99)
Glucose-Capillary: 405 mg/dL — ABNORMAL HIGH (ref 65–99)
Glucose-Capillary: 71 mg/dL (ref 65–99)

## 2016-02-23 LAB — MAGNESIUM: Magnesium: 1.4 mg/dL — ABNORMAL LOW (ref 1.7–2.4)

## 2016-02-23 MED ORDER — WARFARIN SODIUM 5 MG PO TABS
7.5000 mg | ORAL_TABLET | Freq: Once | ORAL | Status: AC
  Administered 2016-02-23: 7.5 mg via ORAL
  Filled 2016-02-23: qty 2

## 2016-02-23 MED ORDER — GABAPENTIN 100 MG PO CAPS: 300.0000 mg | ORAL_CAPSULE | Freq: Three times a day (TID) | ORAL | Status: AC

## 2016-02-23 MED ORDER — MAGNESIUM SULFATE 2 GM/50ML IV SOLN
2.0000 g | Freq: Once | INTRAVENOUS | Status: AC
Start: 2016-02-23 — End: 2016-02-23
  Administered 2016-02-23: 2 g via INTRAVENOUS
  Filled 2016-02-23: qty 50

## 2016-02-23 NOTE — Progress Notes (Signed)
SATURATION QUALIFICATIONS: (This note is used to comply with regulatory documentation for home oxygen)  Patient Saturations on Room Air at Rest = 87%  Patient Saturations on Room Air while Ambulating = 85%  Patient Saturations on 292 Liters of oxygen while Ambulating = 92%  Please briefly explain why patient needs home oxygen:

## 2016-02-23 NOTE — Discharge Summary (Addendum)
Physician Discharge Summary  SUSANA GRIPP MPN:[id number] DOB: 10-31-1955 DOA: 02/21/2016  PCP: [name], MD  Admit date: 02/21/2016 Discharge date: 02/25/2016  Recommendations for Outpatient Follow-up:  1. Follow up with thoracic surgery for recurrent left pleural effusion 2. Discharged on 3 L [place] (new) for hypoxia. 3. F/u peritoneal fluid culture, suspect fluid secondary to malignancy, no h/o cirrhosis, no history or clinical features to suggest SBP. 4. Consider risk/benefit of metformin given CHF  Follow-up Information    [name], MD. Schedule an appointment as soon as possible for a visit in 2 week(s).   Specialty:  Internal Medicine Contact information: [place] [phone]        Melrose Nakayama, MD Follow up.   Specialty:  Cardiothoracic Surgery Why:  office will contact you for appointment Contact information: [address] [place] [address] [phone]          Discharge Diagnoses:  1. Acute hypoxic respiratory failure secondary to acute heart failure, left pleural effusion. 2. Recurrent left pleural effusion 3. Acute on chronic diastolic congestive heart failure. 4. MRSA pneumonia present on admission 5. Stage IV non small cell lung cancer with malignant right pleural effusion status post pleural catheter placement 12/2015. 6. Ascities, possibly malignant 7. DM type 2 8. Chronic severe lower extremity edema. 9. PMH seizure disorder 10. Tobacco use disorder.  Discharge Condition: Stable Disposition: Home  Diet recommendation: Heart Healthy  Filed Weights   02/23/16 0415 02/24/16 0500 02/25/16 0500  Weight: 80 kg (176 lb 5.9 oz) 79.2 kg (174 lb 9.7 oz) 78.6 kg (173 lb 4.5 oz)    History of present illness:  60 year old man PMH lung cancer currently undergoing chemotherapy, malignant right pleural effusion status post pleural catheter placement, recurrent left pleural effusion,  chronic diastolic heart failure, presented to the emergency department with sudden onset of shortness of breath. Initial evaluation suggested acute on chronic diastolic heart failure, acute hypoxic respiratory failure initially requiring facemask and left pleural effusion.  Hospital Course:  Admitted to SDU, treated with aggressive diuresis and urgent left thoracentesis with rapid clinical improvement. Subsequently worsened secondary to ascites. Respiratory much improved s/p paracentesis. Fluid sent for cytology and culture. Imaging suggested ileus, but patient without pain and bowels moving. Fluid analysis pending but no evidence to suggest SBP. Individual issues as below.  1. Acute hypoxic respiratory failure secondary to acute heart failure, left pleural effusion, ascites. Much improved. Close to baseline. 2. Abdominal distension, pain. Resolved s/p paracentesis removing 3.5 liters of fluid. No clinical evidence of ileus. Bowels moving. No pain.  3. Acute on chronic diastolic congestive heart failure. Well-compensated. 4. Left pleural effusion, recurrent; much improved status post thoracentesis. Asymptomatic.  5. MRSA pneumonia present on admission, completed treatment 11/24. 6. 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 Has follow-up with thoracic surgery mid December. Currently undergoing chemotherapy, for cycle October 16. 7. Diabetes mellitus type 2 on metformin, Levemir. Consider risk/benefit metformin as an outpatient. Stable. 8. PMH seizure disorder, remote; colon cancer, left upper extremity DVT currently on anticoagulation. 9. Tobacco use disorder, cigarettes, ongoing; recommended cessation 10. Severe malnutrition   Consultants:  Radiology  Procedures:  Ultrasound-guided left thoracentesis, 1.5 to liters cloudy red colored fluid removed.   US guided paracentesis 3.4  L  Antimicrobials:  Vancomycin 11/24   Discharge Instructions  Discharge Instructions    (HEART FAILURE PATIENTS)  Call MD:  Anytime you have any of the following symptoms: 1) 3 pound weight gain in 24 hours or 5 pounds in 1 week 2) shortness of breath, with or without a dry hacking cough 3) swelling in the hands, feet or stomach 4) if you have to sleep on extra pillows at night in order to breathe.    Complete by:  As directed    Diet - low sodium heart healthy    Complete by:  As directed    Diet Carb Modified    Complete by:  As directed    Discharge instructions    Complete by:  As directed    Call your physician or seek immediate medical attention for swelling, weight gain, shortness of breath, fever or worsening of condition.   Increase activity slowly    Complete by:  As directed        Medication List    STOP taking these medications   hydrochlorothiazide 25 MG tablet Commonly known as:  HYDRODIURIL   meloxicam 7.5 MG tablet Commonly known as:  MOBIC   vancomycin 1-5 GM/200ML-% Soln Commonly known as:  VANCOCIN     TAKE these medications   aspirin EC 81 MG tablet Take 81 mg by mouth daily.   dexamethasone 4 MG tablet Commonly known as:  DECADRON 4 mg by mouth twice a day the day before, day of and day after the chemotherapy every 3 weeks What changed:  how much to take  how to take this  when to take this  additional instructions   feeding supplement (ENSURE ENLIVE) Liqd Take 237 mLs by mouth 2 (two) times daily between meals.   Fenofibric Acid 105 MG Tabs Take 1 tablet by mouth daily.   folic acid 1 MG tablet Commonly known as:  FOLVITE Take 1 tablet (1 mg total) by mouth daily.   furosemide 40 MG tablet Commonly known as:  LASIX Take 1 tablet (40 mg total) by mouth daily.   gabapentin 100 MG capsule Commonly known as:  NEURONTIN Take 3 capsules (300 mg total) by mouth 3 (three) times daily.   HYDROcodone-acetaminophen 7.5-325 MG  tablet Commonly known as:  NORCO Take 1 tablet by mouth 3 (three) times daily.   insulin detemir 100 unit/ml Soln Commonly known as:  LEVEMIR Inject 30 Units into the skin at bedtime.   levothyroxine 25 MCG tablet Commonly known as:  SYNTHROID, LEVOTHROID Take 1 tablet (25 mcg total) by mouth daily before breakfast.   lidocaine-prilocaine cream Commonly known as:  EMLA Apply 1 application topically as needed. What changed:  reasons to take this   metFORMIN 500 MG (MOD) 24 hr tablet Commonly known as:  GLUMETZA Take 1,000 mg by mouth 2 (two) times daily with a meal.   metoprolol tartrate 25 MG tablet Commonly known as:  LOPRESSOR Take 12.5 mg by mouth 2 (two) times daily.   nitroGLYCERIN 0.4 MG SL tablet Commonly known as:  NITROSTAT Place 0.4 mg under the tongue every 5 (five) minutes as needed for chest pain.   potassium chloride 10 MEQ tablet Commonly known as:  K-DUR,KLOR-CON Take 1 tablet (10 mEq total) by mouth daily.   prochlorperazine 10 MG tablet Commonly known as:  COMPAZINE Take 1 tablet (10 mg total) by mouth every 6 (six) hours as needed for nausea or vomiting.   simvastatin 80 MG tablet Commonly known as:  ZOCOR TAKE 1 TABLET BY MOUTH DAILY AT 6 P.M.   SONAFINE Apply 1 application topically daily.  valsartan 160 MG tablet Commonly known as:  DIOVAN Take 160 mg by mouth daily.   warfarin 5 MG tablet Commonly known as:  COUMADIN Take 5 mg by mouth daily.            Durable Medical Equipment        Start     Ordered   02/23/16 1431  For home use only DME oxygen  Once    Question Answer Comment  Mode or (Route) Nasal cannula   Liters per Minute 3   Frequency Continuous (stationary and portable oxygen unit needed)   Oxygen delivery system Gas      02/23/16 1430     Allergies  Allergen Reactions  . No Known Allergies     The results of significant diagnostics from this hospitalization (including imaging, microbiology, ancillary and  laboratory) are listed below for reference.    Significant Diagnostic Studies: Dg Chest Port 1 View  Result Date: 02/21/2016 CLINICAL DATA:  LEFT pleural effusion post thoracentesis, history colon cancer, prostate cancer, RIGHT lung cancer EXAM: PORTABLE CHEST 1 VIEW COMPARISON:  Portable exam 1718 hours compared to 1108 hours FINDINGS: Bibasilar effusions, decreased on LEFT since previous exam. No pneumothorax following thoracentesis. Stable heart size and mediastinal contours. LEFT subclavian Port-A-Cath unchanged. IMPRESSION: No pneumothorax following LEFT thoracentesis. Electronically Signed   By: Lavonia Dana M.D.   On: 02/21/2016 17:42   Dg Chest Port 1 View  Result Date: 02/21/2016 CLINICAL DATA:  Shortness of breath . EXAM: PORTABLE CHEST 1 VIEW COMPARISON:  02/18/2016. FINDINGS: PowerPort catheter noted with tip over superior vena cava. Cardiomegaly with pulmonary vascular prominence and bilateral pulmonary infiltrates with bilateral pleural effusions noted. These findings are consistent congestive heart failure. Findings have progressed from prior exam. No pneumothorax. IMPRESSION: Congestive heart failure with pulmonary edema and bilateral pleural effusions. Findings have progressed from prior exam. Electronically Signed   By: [place]   On: 02/21/2016 11:21   US Thoracentesis Asp Pleural Space W/img Guide  Result Date: 02/21/2016 INDICATION: LEFT pleural effusion, shortness of breath, history of colon cancer, prostate cancer, RIGHT lung cancer, diabetes mellitus, coronary artery disease post MI and PTCA EXAM: ULTRASOUND GUIDED DIAGNOSTIC AND THERAPEUTIC LEFT THORACENTESIS MEDICATIONS: None. COMPLICATIONS: None immediate. PROCEDURE: Procedure, benefits, and risks of procedure were discussed with patient. Written informed consent for procedure was obtained. Time out protocol followed. Pleural effusion localized by ultrasound at the posterior LEFT hemithorax. Skin prepped and draped  in usual sterile fashion. Skin and soft tissues anesthetized with 10 mL of 1% lidocaine. 8 French thoracentesis catheter placed into the LEFT pleural space. 1.52 L of cloudy red fluid aspirated by syringe pump. Procedure tolerated well by patient without immediate complication. FINDINGS: A total of approximately 1.52 L of LEFT pleural fluid was removed. Samples were sent to the laboratory as requested by the clinical team. IMPRESSION: Successful ultrasound guided LEFT thoracentesis yielding 1.52 L of pleural fluid. Electronically Signed   By: Lavonia Dana M.D.   On: 02/21/2016 17:23    Microbiology: Recent Results (from the past 240 hour(s))  Urine culture     Status: Abnormal   Collection Time: 02/21/16 11:07 AM  Result Value Ref Range Status   Specimen Description URINE, RANDOM  Final   Special Requests NONE  Final   Culture (A)  Final    <10,000 COLONIES/mL INSIGNIFICANT GROWTH Performed at [facility]    Report Status 02/23/2016 FINAL  Final  Culture, blood (Routine X 2) w Reflex to  ID Panel     Status: None (Preliminary result)   Collection Time: 02/21/16 11:48 AM  Result Value Ref Range Status   Specimen Description PORTA CATH  Final   Special Requests BOTTLES DRAWN AEROBIC AND ANAEROBIC 4CC EACH  Final   Culture NO GROWTH 4 DAYS  Final   Report Status PENDING  Incomplete  Culture, blood (Routine X 2) w Reflex to ID Panel     Status: None (Preliminary result)   Collection Time: 02/21/16 12:02 PM  Result Value Ref Range Status   Specimen Description BLOOD RIGHT HAND  Final   Special Requests   Final    BOTTLES DRAWN AEROBIC AND ANAEROBIC AEB 4CC ANA [place]   Culture NO GROWTH 4 DAYS  Final   Report Status PENDING  Incomplete  MRSA PCR Screening     Status: Abnormal   Collection Time: 02/21/16  4:25 PM  Result Value Ref Range Status   MRSA by PCR POSITIVE (A) NEGATIVE Final    Comment: RESULT CALLED TO, READ BACK BY AND VERIFIED WITH: GANNON,S @ 1859 ON11/24/2017 BY  BAYSE,L        The GeneXpert MRSA Assay (FDA approved for NASAL specimens only), is one component of a comprehensive MRSA colonization surveillance program. It is not intended to diagnose MRSA infection nor to guide or monitor treatment for MRSA infections.   Culture, body fluid-bottle     Status: None (Preliminary result)   Collection Time: 02/21/16  5:20 PM  Result Value Ref Range Status   Specimen Description PLEURAL  Final   Special Requests NONE  Final   Culture NO GROWTH 4 DAYS  Final   Report Status PENDING  Incomplete  Gram stain     Status: None   Collection Time: 02/24/16 12:35 PM  Result Value Ref Range Status   Specimen Description FLUID ASCITIC COLLECTED BY DOCTOR BOLES  Final   Special Requests NONE  Final   Gram Stain   Final    CYTOSPIN SMEAR WBC PRESENT, PREDOMINANTLY PMN NO ORGANISMS SEEN Performed at [facility]    Report Status 02/24/2016 FINAL  Final  Culture, body fluid-bottle     Status: None (Preliminary result)   Collection Time: 02/24/16 12:35 PM  Result Value Ref Range Status   Specimen Description FLUID ASCITIC COLLECTED BY DOCTOR BOLES  Final   Special Requests BOTTLES DRAWN AEROBIC AND ANAEROBIC 10CC EACH  Final   Culture NO GROWTH < 24 HOURS  Final   Report Status PENDING  Incomplete     Labs: Basic Metabolic Panel:  Recent Labs Lab 02/21/16 1134 02/22/16 0751 02/23/16 0809 02/24/16 0503 02/25/16 0500  NA 143 142 138 138 136  K 3.0* 2.6* 3.8 3.7 3.7  CL 110 106 105 104 104  CO2 '27 29 27 29 28  '$ GLUCOSE 104* 101* 103* 90 104*  BUN '15 17 20 19 16  '$ CREATININE 0.88 0.98 0.95 0.86 0.77  CALCIUM 8.1* 8.0* 8.0* 8.2* 7.8*  MG  --   --  1.4*  --   --    Liver Function Tests:  Recent Labs Lab 02/21/16 1134  AST 30  ALT 21  ALKPHOS 86  BILITOT 0.2*  PROT 6.2*  ALBUMIN 1.7*   CBC:  Recent Labs Lab 02/21/16 1134 02/22/16 0751 02/23/16 0809 02/24/16 0503 02/25/16 0500  WBC 21.2* 18.0* 19.4* 20.2* 15.3*  NEUTROABS  19.1*  --   --   --   --   HGB 9.0* 8.6* 8.7* 8.6* 8.0*  HCT 28.4* 27.6* 27.7* 27.3* 24.8*  MCV 80.0 80.5 81.2 81.5 81.3  PLT 547* 502* 505* 523* 507*   Cardiac Enzymes:  Recent Labs Lab 02/21/16 1829 02/21/16 2342 02/22/16 0751  TROPONINI <0.03 <0.03 <0.03    Recent Labs  12/21/15 1928 01/31/16 0312 02/21/16 1134  BNP 33.0 174.0* 158.0*   CBG:  Recent Labs Lab 02/24/16 1159 02/24/16 1643 02/24/16 2112 02/25/16 0756 02/25/16 1136  GLUCAP 156* 90 165* 125* 206*    Principal Problem:   Acute on chronic diastolic CHF (congestive heart failure) (HCC) Active Problems:   Cigarette smoker   Adenocarcinoma of right lung, stage 4 (HCC)   Acute respiratory failure with hypoxia (HCC)   DVT of upper extremity (deep vein thrombosis) ([place])   Long term current use of anticoagulant therapy   Pulmonary edema   Time coordinating discharge: 35 minutes  Signed:  Murray Hodgkins, MD [facility] 02/25/2016, 3:10 PM   By signing my name below, I, Hilbert Odor, attest that this documentation has been prepared under the direction and in the presence of Daniel P. Sarajane Jews, MD. Electronically signed: Hilbert Odor, Scribe.  02/25/16   I personally performed the services described in this documentation. All medical record entries made by the scribe were at my direction. I have reviewed the chart and agree that the record reflects my personal performance and is accurate and complete. Murray Hodgkins, MD

## 2016-02-23 NOTE — Progress Notes (Signed)
ANTICOAGULATION CONSULT NOTE - Initial Consult  Pharmacy Consult for Coumadin Indication: DVT  Allergies  Allergen Reactions  . No Known Allergies     Patient Measurements: Height: '5\' 6"'$  (167.6 cm) Weight: 176 lb 5.9 oz (80 kg) IBW/kg (Calculated) : 63.8   Vital Signs: Temp: 98 F (36.7 C) (11/26 0415) Temp Source: Oral (11/26 0415) BP: 122/88 (11/26 0415) Pulse Rate: 100 (11/26 0415)  Labs:  Recent Labs  02/21/16 1134 02/21/16 1829 02/21/16 2342 02/22/16 0751 02/23/16 0809  HGB 9.0*  --   --  8.6* 8.7*  HCT 28.4*  --   --  27.6* 27.7*  PLT 547*  --   --  502* 505*  LABPROT 18.5*  --   --  17.1* 16.9*  INR 1.52  --   --  1.38 1.37  CREATININE 0.88  --   --  0.98 0.95  TROPONINI  --  <0.03 <0.03 <0.03  --     Estimated Creatinine Clearance: 80.2 mL/min (by C-G formula based on SCr of 0.95 mg/dL).   Medical History: Past Medical History:  Diagnosis Date  . Adenocarcinoma of right lung, stage 4 ([place]) 12/03/2015  . Arthritis    back, joint pain  . Bone cancer ([place])    T 1T2  . Cancer [facility]) 2011   colon cancer  . Colon cancer [facility])    Colon resection and Chemotherapy  . Coronary atherosclerosis of native coronary artery   . Diabetes mellitus without complication ([place])    takes Meformin and Levemir daily  . DVT (deep venous thrombosis) (HCC)    Left arm  . Dyspnea    lying,sitting,exertion  . Heart attack   . Hypothyroidism    takes Synthroid daily  . Myocardial infarction 2007  . Nocturia   . Other and unspecified hyperlipidemia    takes Simvastatin daily  . Prostate cancer ([place]) 12/13/12   ,Radiation  . Seizures ([place])    hx of-had one over 20 yrs ago  . Tingling    hands  . Unspecified essential hypertension    takes Diovan and Metoprolol daily  . Weakness    in legs    Medications:  Prescriptions Prior to Admission  Medication Sig Dispense Refill Last Dose  . aspirin EC 81 MG tablet Take 81 mg by mouth daily.   Past Month at Unknown time   . dexamethasone (DECADRON) 4 MG tablet 4 mg by mouth twice a day the day before, day of and day after the chemotherapy every 3 weeks (Patient taking differently: Take 4 mg by mouth 2 (two) times daily. 4 mg by mouth twice a day the day before, day of and day after the chemotherapy every 3 weeks) 40 tablet 1 Past Week at Unknown time  . feeding supplement, ENSURE ENLIVE, (ENSURE ENLIVE) LIQD Take 237 mLs by mouth 2 (two) times daily between meals. 237 mL 12 Past Month at Unknown time  . Fenofibric Acid 105 MG TABS Take 1 tablet by mouth daily.   Past Week at Unknown time  . folic acid (FOLVITE) 1 MG tablet Take 1 tablet (1 mg total) by mouth daily. 30 tablet 4 Past Week at Unknown time  . furosemide (LASIX) 40 MG tablet Take 1 tablet (40 mg total) by mouth daily. 14 tablet 1 Past Week at Unknown time  . gabapentin (NEURONTIN) 100 MG capsule Take 1 capsule (100 mg total) by mouth 3 (three) times daily. (Patient taking differently: Take 300 mg by mouth 3 (three) times  daily. ) 90 capsule 0 Past Week at Unknown time  . hydrochlorothiazide (HYDRODIURIL) 25 MG tablet Take 25 mg by mouth daily.   Past Week at Unknown time  . HYDROcodone-acetaminophen (NORCO) 7.5-325 MG tablet Take 1 tablet by mouth 3 (three) times daily. 20 tablet 0 02/21/2016 at Unknown time  . insulin detemir (LEVEMIR) 100 unit/ml SOLN Inject 30 Units into the skin at bedtime.   Past Week at Unknown time  . levothyroxine (SYNTHROID, LEVOTHROID) 25 MCG tablet Take 1 tablet (25 mcg total) by mouth daily before breakfast. 30 tablet 11 Past Week at Unknown time  . lidocaine-prilocaine (EMLA) cream Apply 1 application topically as needed. (Patient taking differently: Apply 1 application topically as needed (for port access). ) 30 g 0 Past Month at Unknown time  . metFORMIN (GLUMETZA) 500 MG (MOD) 24 hr tablet Take 1,000 mg by mouth 2 (two) times daily with a meal.   Past Week at Unknown time  . metoprolol tartrate (LOPRESSOR) 25 MG tablet Take  12.5 mg by mouth 2 (two) times daily.   Past Week at Unknown time  . potassium chloride (K-DUR,KLOR-CON) 10 MEQ tablet Take 1 tablet (10 mEq total) by mouth daily. 14 tablet 1 Past Week at Unknown time  . prochlorperazine (COMPAZINE) 10 MG tablet Take 1 tablet (10 mg total) by mouth every 6 (six) hours as needed for nausea or vomiting. 30 tablet 0 Past Week at Unknown time  . simvastatin (ZOCOR) 80 MG tablet TAKE 1 TABLET BY MOUTH DAILY AT 6 P.M. 30 tablet 3 Past Week at Unknown time  . valsartan (DIOVAN) 160 MG tablet Take 160 mg by mouth daily.   Past Week at Unknown time  . [EXPIRED] vancomycin (VANCOCIN) 1-5 GM/200ML-% SOLN Inject 200 mLs (1,000 mg total) into the vein daily. 3000 mL 0 Past Month at Unknown time  . warfarin (COUMADIN) 5 MG tablet Take 5 mg by mouth daily.   1 Past Week at Unknown time  . Wound Dressings (SONAFINE) Apply 1 application topically daily.   Past Week at Unknown time  . meloxicam (MOBIC) 7.5 MG tablet Take 7.5 mg by mouth daily.    at unknown  . nitroGLYCERIN (NITROSTAT) 0.4 MG SL tablet Place 0.4 mg under the tongue every 5 (five) minutes as needed for chest pain.    at unknown    Assessment: Continuation of Coumadin PTA for DVT in LUE PTA Coumadin dose 5 mg daily Coumadin held on admission until after thoracentesis  Goal of Therapy:  INR 2-3 Monitor platelets by anticoagulation protocol: Yes   Plan:  Coumadin 7.5 mg po x 1 dose today (bump INR) INR/PT daily CBC daily Monitor for sign of bleeding  Abner Greenspan, Kaspian Muccio Bennett 02/23/2016,9:15 AM

## 2016-02-23 NOTE — Progress Notes (Signed)
PROGRESS NOTE  Cameron Chandler KGM:[id number] DOB: November 24, 1955 DOA: 02/21/2016 PCP: [name], MD  Brief Narrative: 60 year old man PMH lung cancer currently undergoing chemotherapy, malignant right pleural effusion status post pleural catheter placement, recurrent left pleural effusion, chronic diastolic heart failure, presented to the emergency department with sudden onset of shortness of breath. Initial evaluation suggested acute on chronic diastolic heart failure, acute hypoxic respiratory failure initially requiring facemask and left pleural effusion.  Assessment/Plan: 1. Acute hypoxic respiratory failure secondary to acute heart failure, left pleural effusion. Much improved status post thoracentesis 11/24. -5.5 L since admission. 2. Acute on chronic diastolic congestive heart failure, improving. Adequate diuresis thus far. 3. Left pleural effusion, recurrent; much improved status post thoracentesis. 4. MRSA pneumonia present on admission, completed treatment 11/24 5. 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 Has follow-up with thoracic surgery mid December. Currently undergoing chemotherapy, for cycle October 16. 6. Diabetes mellitus type 2 on metformin, Levaquin there. Consider risk/benefit metformin as an outpatient. Blood sugars stable. 7. Chronic severe lower extremity edema, [place] boots placed as an outpatient. 8. PMH seizure disorder, remote; colon cancer, left upper extremity DVT currently on anticoagulation. 9. Tobacco use disorder, cigarettes, ongoing   Overall much improved. Approaching baseline in respiratory status and appears euvolemic at this time. Plan to wean oxygen, ambulate to see whether he may need home oxygen.  Change to oral diuretics. Discontinue telemetry.  Anticipate discharge later today versus tomorrow morning.  Close outpatient follow-up with thoracic surgery.  Could consider pleural catheter placement on the left side depending on the accumulation of fluid.   DVT prophylaxis: warfarin Code Status: full code Family Communication: No family at bedside Disposition Plan: home  Murray Hodgkins, MD  [facility] Direct contact: [phone] --[place] [place]  --[email or web address]; password [email or web address]  7PM-7AM contact night coverage as above 02/23/2016, 6:53 AM  LOS: 2 days   Consultants:  Radiology  Procedures:  Ultrasound-guided left thoracentesis, 1.5 to liters cloudy red colored fluid removed.   Antimicrobials:  Vancomycin 11/24  Interval history/Subjective: No issues overnight. Breathing much better, not quite back to baseline yet.  Objective: Vitals:   02/22/16 1600 02/22/16 1700 02/22/16 2015 02/23/16 0415  BP: 114/88 (!) 112/91 110/88 122/88  Pulse: (!) 101 (!) 118 99 100  Resp: (!) 21 (!) '25 20 20  '$ Temp: 98.1 F (36.7 C)  97.8 F (36.6 C) 98 F (36.7 C)  TempSrc: Oral  Oral Oral  SpO2: 97% 95% 99% 98%  Weight:    80 kg (176 lb 5.9 oz)  Height:        Intake/Output Summary (Last 24 hours) at 02/23/16 0653 Last data filed at 02/22/16 1827  Gross per 24 hour  Intake              480 ml  Output             1300 ml  Net             -820 ml     Filed Weights   02/21/16 1700 02/22/16 0500 02/23/16 0415  Weight: 80.2 kg (176 lb 12.9 oz) 79.3 kg (174 lb 13.2 oz) 80 kg (176 lb 5.9 oz)    Exam: Constitutional:   Appears calm, comfortable. Respiratory:   Clear to auscultation bilaterally. No wheezes, rales or rhonchi. Normal respiratory effort. Cardiovascular:   Tachycardic, regular rhythm. No murmur, rub or gallop. No lower extremity edema. Telemetry  sinus tachycardia Psychiatric:   Grossly normal mood and affect. Judgment and insight appear intact. Speech fluent and clear.    I have personally reviewed following labs and imaging studies:  Urine output 1300. Meyer 1600 mL since admission (not including  thoracentesis).  Potassium 3.8. Magnesium 1.4. Remainder BMP unremarkable.  WBC without significant change, 19.4. This is a chronic finding. Hemoglobin stable 8.7.  INR 1.37.  Scheduled Meds: . aspirin EC  81 mg Oral Daily  . feeding supplement (ENSURE ENLIVE)  237 mL Oral BID BM  . furosemide  40 mg Intravenous Q12H  . gabapentin  300 mg Oral TID  . HYDROcodone-acetaminophen  1 tablet Oral TID  . insulin aspart  0-5 Units Subcutaneous QHS  . insulin aspart  0-9 Units Subcutaneous TID WC  . levothyroxine  25 mcg Oral QAC breakfast  . sodium chloride flush  3 mL Intravenous Q12H  . Warfarin - Pharmacist Dosing Inpatient   Does not apply Q24H   Continuous Infusions:  Principal Problem:   Acute on chronic diastolic CHF (congestive heart failure) (HCC) Active Problems:   Cigarette smoker   Adenocarcinoma of right lung, stage 4 (HCC)   Acute respiratory failure with hypoxia (HCC)   DVT of upper extremity (deep vein thrombosis) ([place])   Long term current use of anticoagulant therapy   Pulmonary edema   LOS: 2 days   By signing my name below, I, Hilbert Odor, attest that this documentation has been prepared under the direction and in the presence of [place]. Sarajane Jews, MD. Electronically signed: Hilbert Odor, Scribe.  02/23/16, 9:30 AM   I personally performed the services described in this documentation. All medical record entries made by the scribe were at my direction. I have reviewed the chart and agree that the record reflects my personal performance and is accurate and complete. Murray Hodgkins, MD

## 2016-02-23 NOTE — Progress Notes (Signed)
[place] removed so room air SpO2 can be obtained.

## 2016-02-23 NOTE — Progress Notes (Signed)
[facility] called and states they need "more info from pt.'s chart to set up home O2." States it will be tomorrow morning. Notified Dr. Sarajane Jews and [facility] via text. Notified pt.'s daughter who states "I didn't want to take him home in the dark anyway."

## 2016-02-24 ENCOUNTER — Other Ambulatory Visit: Payer: Medicare HMO

## 2016-02-24 ENCOUNTER — Inpatient Hospital Stay (HOSPITAL_COMMUNITY): Payer: Medicare HMO

## 2016-02-24 ENCOUNTER — Ambulatory Visit: Payer: Medicare HMO

## 2016-02-24 DIAGNOSIS — R1084 Generalized abdominal pain: Secondary | ICD-10-CM

## 2016-02-24 LAB — CBC
HCT: 27.3 % — ABNORMAL LOW (ref 39.0–52.0)
Hemoglobin: 8.6 g/dL — ABNORMAL LOW (ref 13.0–17.0)
MCH: 25.7 pg — ABNORMAL LOW (ref 26.0–34.0)
MCHC: 31.5 g/dL (ref 30.0–36.0)
MCV: 81.5 fL (ref 78.0–100.0)
PLATELETS: 523 10*3/uL — AB (ref 150–400)
RBC: 3.35 MIL/uL — AB (ref 4.22–5.81)
RDW: 23.4 % — ABNORMAL HIGH (ref 11.5–15.5)
WBC: 20.2 10*3/uL — AB (ref 4.0–10.5)

## 2016-02-24 LAB — GLUCOSE, CAPILLARY
GLUCOSE-CAPILLARY: 232 mg/dL — AB (ref 65–99)
GLUCOSE-CAPILLARY: 156 mg/dL — AB (ref 65–99)
GLUCOSE-CAPILLARY: 165 mg/dL — AB (ref 65–99)
Glucose-Capillary: 366 mg/dL — ABNORMAL HIGH (ref 65–99)
Glucose-Capillary: 90 mg/dL (ref 65–99)

## 2016-02-24 LAB — BODY FLUID CELL COUNT WITH DIFFERENTIAL
EOS FL: 0 %
Lymphs, Fluid: 6 %
Monocyte-Macrophage-Serous Fluid: 21 % — ABNORMAL LOW (ref 50–90)
NEUTROPHIL FLUID: 65 % — AB (ref 0–25)
OTHER: 8 %
Total Nucleated Cell Count, Fluid: 4098 cu mm — ABNORMAL HIGH (ref 0–1000)

## 2016-02-24 LAB — BASIC METABOLIC PANEL
ANION GAP: 5 (ref 5–15)
BUN: 19 mg/dL (ref 6–20)
CHLORIDE: 104 mmol/L (ref 101–111)
CO2: 29 mmol/L (ref 22–32)
Calcium: 8.2 mg/dL — ABNORMAL LOW (ref 8.9–10.3)
Creatinine, Ser: 0.86 mg/dL (ref 0.61–1.24)
GFR calc Af Amer: >60 mL/min (ref >60)
Glucose, Bld: 90 mg/dL (ref 65–99)
POTASSIUM: 3.7 mmol/L (ref 3.5–5.1)
SODIUM: 138 mmol/L (ref 135–145)

## 2016-02-24 LAB — GRAM STAIN

## 2016-02-24 LAB — PROTIME-INR
INR: 1.44
PROTHROMBIN TIME: 17.7 s — AB (ref 11.4–15.2)

## 2016-02-24 MED ORDER — POLYETHYLENE GLYCOL 3350 17 G PO PACK
17.0000 g | PACK | Freq: Two times a day (BID) | ORAL | Status: DC
  Administered 2016-02-24: 17 g via ORAL
  Filled 2016-02-24 (×3): qty 1

## 2016-02-24 MED ORDER — FUROSEMIDE 40 MG PO TABS
40.0000 mg | ORAL_TABLET | Freq: Every day | ORAL | Status: DC
  Administered 2016-02-24 – 2016-02-25 (×2): 40 mg via ORAL
  Filled 2016-02-24 (×2): qty 1

## 2016-02-24 MED ORDER — KCL IN DEXTROSE-NACL 40-5-0.45 MEQ/L-%-% IV SOLN
INTRAVENOUS | Status: DC
  Administered 2016-02-24 – 2016-02-25 (×2): via INTRAVENOUS

## 2016-02-24 NOTE — Care Management Note (Signed)
Case Management Note  Patient Details  Name: Cameron Chandler MRN: [id number] Date of Birth: 1955/11/15  Subjective/Objective:                  Pt admitted with resp failure. He is from home, lives with daughter and is active with [place] services. He plans to return home with resumption of nursing services. He has qualified for home oxygen and referral was sent over weekend to [facility]. [place] O2 tank delivered to pt room today prior to DC being cancelled. Salli Real, of [facility], is aware of admission and will obtain pt info from chart.   Action/Plan: Pt will need to re-qualify for oxygen prior to DC. He will need order to resume home health services at DC. CM will cont to follow.   Expected Discharge Date:  02/18/16               Expected Discharge Plan:  [place]  In-House Referral:  NA  Discharge planning Services  CM Consult  Post Acute Care Choice:  Durable Medical Equipment, Home Health Choice offered to:  Patient  DME Arranged:  Oxygen DME Agency:  [facility] [facility] [employer] Arranged:  RN, PT [facility] Agency:  [place]  Status of Service:  In process, will continue to follow  Sherald Barge, RN 02/24/2016, 4:15 PM

## 2016-02-24 NOTE — Care Management Important Message (Signed)
Important Message  Patient Details  Name: Cameron Chandler MRN: [id number] Date of Birth: March 16, 1956   Medicare Important Message Given:  Yes    Sherald Barge, RN 02/24/2016, 10:02 AM

## 2016-02-24 NOTE — Procedures (Signed)
PreOperative Dx: Ascites Postoperative Dx: Ascites Procedure:   US guided paracentesis Radiologist:  Thornton Papas Anesthesia:  10 ml of1% lidocaine Specimen:  3.4 L of milky white ascitic fluid EBL:   < 1 ml Complications: None

## 2016-02-24 NOTE — Progress Notes (Signed)
[place] for Coumadin Indication: DVT  Allergies  Allergen Reactions  . No Known Allergies     Patient Measurements: Height: '5\' 6"'$  (167.6 cm) Weight: 174 lb 9.7 oz (79.2 kg) IBW/kg (Calculated) : 63.8   Vital Signs: Temp: 97.4 F (36.3 C) (11/27 0500) Temp Source: Oral (11/27 0500) BP: 128/95 (11/27 0500) Pulse Rate: 108 (11/27 0500)  Labs:  Recent Labs  02/21/16 1829 02/21/16 2342 02/22/16 0751 02/23/16 0809 02/24/16 0503  HGB  --   --  8.6* 8.7* 8.6*  HCT  --   --  27.6* 27.7* 27.3*  PLT  --   --  502* 505* 523*  LABPROT  --   --  17.1* 16.9* 17.7*  INR  --   --  1.38 1.37 1.44  CREATININE  --   --  0.98 0.95 0.86  TROPONINI <0.03 <0.03 <0.03  --   --     Estimated Creatinine Clearance: 88.2 mL/min (by C-G formula based on SCr of 0.86 mg/dL).   Medical History: Past Medical History:  Diagnosis Date  . Adenocarcinoma of right lung, stage 4 ([place]) 12/03/2015  . Arthritis    back, joint pain  . Bone cancer ([place])    T 1T2  . Cancer [facility]) 2011   colon cancer  . Colon cancer [facility])    Colon resection and Chemotherapy  . Coronary atherosclerosis of native coronary artery   . Diabetes mellitus without complication ([place])    takes Meformin and Levemir daily  . DVT (deep venous thrombosis) (H[facility])    Left arm  . Dyspnea    lying,sitting,exertion  . Heart attack   . Hypothyroidism    takes Synthroid daily  . Myocardial infarction 2007  . Nocturia   . Other and unspecified hyperlipidemia    takes Simvastatin daily  . Prostate cancer ([place]) 12/13/12   ,Radiation  . Seizures ([place])    hx of-had one over 20 yrs ago  . Tingling    hands  . Unspecified essential hypertension    takes Diovan and Metoprolol daily  . Weakness    in legs    Medications:  Prescriptions Prior to Admission  Medication Sig Dispense Refill Last Dose  . aspirin EC 81 MG tablet Take 81 mg by mouth daily.   Past Month at Unknown time  .  dexamethasone (DECADRON) 4 MG tablet 4 mg by mouth twice a day the day before, day of and day after the chemotherapy every 3 weeks (Patient taking differently: Take 4 mg by mouth 2 (two) times daily. 4 mg by mouth twice a day the day before, day of and day after the chemotherapy every 3 weeks) 40 tablet 1 Past Week at Unknown time  . feeding supplement, ENSURE ENLIVE, (ENSURE ENLIVE) LIQD Take 237 mLs by mouth 2 (two) times daily between meals. 237 mL 12 Past Month at Unknown time  . Fenofibric Acid 105 MG TABS Take 1 tablet by mouth daily.   Past Week at Unknown time  . folic acid (FOLVITE) 1 MG tablet Take 1 tablet (1 mg total) by mouth daily. 30 tablet 4 Past Week at Unknown time  . furosemide (LASIX) 40 MG tablet Take 1 tablet (40 mg total) by mouth daily. 14 tablet 1 Past Week at Unknown time  . hydrochlorothiazide (HYDRODIURIL) 25 MG tablet Take 25 mg by mouth daily.   Past Week at Unknown time  . HYDROcodone-acetaminophen (NORCO) 7.5-325 MG tablet Take 1 tablet by mouth  3 (three) times daily. 20 tablet 0 02/21/2016 at Unknown time  . insulin detemir (LEVEMIR) 100 unit/ml SOLN Inject 30 Units into the skin at bedtime.   Past Week at Unknown time  . levothyroxine (SYNTHROID, LEVOTHROID) 25 MCG tablet Take 1 tablet (25 mcg total) by mouth daily before breakfast. 30 tablet 11 Past Week at Unknown time  . lidocaine-prilocaine (EMLA) cream Apply 1 application topically as needed. (Patient taking differently: Apply 1 application topically as needed (for port access). ) 30 g 0 Past Month at Unknown time  . metFORMIN (GLUMETZA) 500 MG (MOD) 24 hr tablet Take 1,000 mg by mouth 2 (two) times daily with a meal.   Past Week at Unknown time  . metoprolol tartrate (LOPRESSOR) 25 MG tablet Take 12.5 mg by mouth 2 (two) times daily.   Past Week at Unknown time  . potassium chloride (K-DUR,KLOR-CON) 10 MEQ tablet Take 1 tablet (10 mEq total) by mouth daily. 14 tablet 1 Past Week at Unknown time  . prochlorperazine  (COMPAZINE) 10 MG tablet Take 1 tablet (10 mg total) by mouth every 6 (six) hours as needed for nausea or vomiting. 30 tablet 0 Past Week at Unknown time  . simvastatin (ZOCOR) 80 MG tablet TAKE 1 TABLET BY MOUTH DAILY AT 6 P.M. 30 tablet 3 Past Week at Unknown time  . valsartan (DIOVAN) 160 MG tablet Take 160 mg by mouth daily.   Past Week at Unknown time  . [EXPIRED] vancomycin (VANCOCIN) 1-5 GM/200ML-% SOLN Inject 200 mLs (1,000 mg total) into the vein daily. 3000 mL 0 Past Month at Unknown time  . warfarin (COUMADIN) 5 MG tablet Take 5 mg by mouth daily.   1 Past Week at Unknown time  . Wound Dressings (SONAFINE) Apply 1 application topically daily.   Past Week at Unknown time  . [DISCONTINUED] gabapentin (NEURONTIN) 100 MG capsule Take 1 capsule (100 mg total) by mouth 3 (three) times daily. (Patient taking differently: Take 300 mg by mouth 3 (three) times daily. ) 90 capsule 0 Past Week at Unknown time  . meloxicam (MOBIC) 7.5 MG tablet Take 7.5 mg by mouth daily.    at unknown  . nitroGLYCERIN (NITROSTAT) 0.4 MG SL tablet Place 0.4 mg under the tongue every 5 (five) minutes as needed for chest pain.    at unknown    Assessment: Continuation of Coumadin PTA for DVT in LUE PTA Coumadin dose 5 mg daily Coumadin resumed after thoracentesis  Goal of Therapy:  INR 2-3 Monitor platelets by anticoagulation protocol: Yes   Plan:  Coumadin 7.5 mg po x 1 dose today INR/PT daily CBC daily Monitor for sign of bleeding  Tagen Milby Poteet 02/24/2016,9:02 AM

## 2016-02-24 NOTE — Progress Notes (Signed)
PROGRESS NOTE  Cameron Chandler QQI:[id number] DOB: 08/16/1955 DOA: 02/21/2016 PCP: [name], MD  Brief Narrative: 60 year old man PMH lung cancer currently undergoing chemotherapy, malignant right pleural effusion status post pleural catheter placement, recurrent left pleural effusion, chronic diastolic heart failure, presented to the emergency department with sudden onset of shortness of breath. Initial evaluation suggested acute on chronic diastolic heart failure, acute hypoxic respiratory failure initially requiring facemask and left pleural effusion.  Assessment/Plan: 1. Acute hypoxic respiratory failure secondary to acute heart failure, left pleural effusion.respiratory status worse today. Suspect secondary to abd distension rather than CHF or pleural effusion based on exam. 2. Abdominal distension, pain. Reports previous paracentesis, I don't see in record. No abd imaging other than MRI on file which was unremarkable. No h/o cirrhosis. Ddx ileus, obstruction (doubted, bowels moving), ascites (favored). 3. Acute on chronic diastolic congestive heart failure, appears euvolemic 4. Left pleural effusion, recurrent; much improved status post thoracentesis. 5. MRSA pneumonia present on admission, completed treatment 11/24. 6. 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 Has follow-up with thoracic surgery mid December. Currently undergoing chemotherapy, for cycle October 16. 7. Diabetes mellitus type 2 on metformin, Levemir. Consider risk/benefit metformin as an outpatient. Labile. 8. PMH seizure disorder, remote; colon cancer, left upper extremity DVT currently on anticoagulation. 9. Tobacco use disorder, cigarettes, ongoing; recommend cessation   Worse today, increased resp effort, new abd pain and distension will require w/o.  Check CXR to assess effusions, AXR to r/o ileus, assess [place] for  paracentesis if enough fluid.   Close outpatient follow-up with thoracic surgery. Could consider pleural catheter placement on the left side depending on the accumulation of fluid.   DVT prophylaxis: warfarin Code Status: full code Family Communication: No family at bedside Disposition Plan: home  Murray Hodgkins, MD  [facility] Direct contact: [phone] --[address] [place]  --[email or web address]; password [email or web address]  7PM-7AM contact night coverage as above 02/24/2016, 10:45 AM  LOS: 3 days   Consultants:  Radiology  Procedures:  Ultrasound-guided left thoracentesis, 1.5 to liters cloudy red colored fluid removed.   Antimicrobials:  Vancomycin 11/24  Interval history/Subjective: Home oxygen could not be obtained yesterday, so kept overnight. RN noted constipation.  Feels poorly today, more short of breath. Main complaint is abdominal distension and pain. BM last night. No vomiting. Reports having had paracentesis in the past.  Objective: Vitals:   02/23/16 1500 02/23/16 2049 02/23/16 2053 02/24/16 0500  BP: (!) 116/94  125/90 (!) 128/95  Pulse: (!) 104 (!) 105 (!) 104 (!) 108  Resp: '18 18 18 18  '$ Temp: 98.2 F (36.8 C)  97.3 F (36.3 C) 97.4 F (36.3 C)  TempSrc: Oral  Oral Oral  SpO2: 100% 96% 97% 96%  Weight:    79.2 kg (174 lb 9.7 oz)  Height:        Intake/Output Summary (Last 24 hours) at 02/24/16 1045 Last data filed at 02/24/16 0500  Gross per 24 hour  Intake              580 ml  Output             1150 ml  Net             -570 ml     Filed Weights   02/22/16 0500 02/23/16 0415 02/24/16 0500  Weight: 79.3 kg (174 lb 13.2 oz) 80 kg (176 lb 5.9 oz) 79.2 kg (174 lb  9.7 oz)    Exam: Constitutional:   Appears ill, non-toxic, uncomfortable, SOB Respiratory:   CTA bilaterally no w/r/r. Moderate increased resp effort Cardiovascular:   RRR no m/r/g. No LE edema Abdomen:   Markedly distended, no sig tenderness, tympanic Psychiatric:   Alert,  speech fluent and clear, mood and affect appear normal, judgement and insight appear intact   I have personally reviewed following labs and imaging studies:  Urine output 1150. Minus 1.9 L since admission(not including thoracentesis).  Potassium 3.7. Remainder BMP unremarkable.  WBC without significant change, 20.2. This is a chronic finding. Hemoglobin stable 8.6  Scheduled Meds: . aspirin EC  81 mg Oral Daily  . feeding supplement (ENSURE ENLIVE)  237 mL Oral BID BM  . gabapentin  300 mg Oral TID  . HYDROcodone-acetaminophen  1 tablet Oral TID  . insulin aspart  0-5 Units Subcutaneous QHS  . insulin aspart  0-9 Units Subcutaneous TID WC  . levothyroxine  25 mcg Oral QAC breakfast  . polyethylene glycol  17 g Oral BID  . sodium chloride flush  3 mL Intravenous Q12H  . Warfarin - Pharmacist Dosing Inpatient   Does not apply Q24H   Continuous Infusions:  Principal Problem:   Acute on chronic diastolic CHF (congestive heart failure) (HCC) Active Problems:   Cigarette smoker   Adenocarcinoma of right lung, stage 4 (HCC)   Acute respiratory failure with hypoxia (HCC)   DVT of upper extremity (deep vein thrombosis) ([place])   Long term current use of anticoagulant therapy   Pulmonary edema   LOS: 3 days

## 2016-02-24 NOTE — Progress Notes (Signed)
Paracentesis complete no signs of distress.  

## 2016-02-24 NOTE — Progress Notes (Signed)
Pt's abdomen is distended, taught, and tender. He states that he feels like he has a lot of gas and needs to have a bowel movement, but cannot. Last bowel movement was yesterday, 11/26. Pt drank some prune juice last night before bed, but has not had a bowel movement over night. He states that he feels like a laxative would help him to feel better. Ames Dura, BSN, RN 02/24/2016 5:58 AM

## 2016-02-25 DIAGNOSIS — R180 Malignant ascites: Secondary | ICD-10-CM

## 2016-02-25 LAB — BASIC METABOLIC PANEL
Anion gap: 4 — ABNORMAL LOW (ref 5–15)
BUN: 16 mg/dL (ref 6–20)
CHLORIDE: 104 mmol/L (ref 101–111)
CO2: 28 mmol/L (ref 22–32)
CREATININE: 0.77 mg/dL (ref 0.61–1.24)
Calcium: 7.8 mg/dL — ABNORMAL LOW (ref 8.9–10.3)
GFR calc non Af Amer: >60 mL/min (ref >60)
GLUCOSE: 104 mg/dL — AB (ref 65–99)
Potassium: 3.7 mmol/L (ref 3.5–5.1)
Sodium: 136 mmol/L (ref 135–145)

## 2016-02-25 LAB — CBC
HEMATOCRIT: 24.8 % — AB (ref 39.0–52.0)
HEMOGLOBIN: 8 g/dL — AB (ref 13.0–17.0)
MCH: 26.2 pg (ref 26.0–34.0)
MCHC: 32.3 g/dL (ref 30.0–36.0)
MCV: 81.3 fL (ref 78.0–100.0)
Platelets: 507 10*3/uL — ABNORMAL HIGH (ref 150–400)
RBC: 3.05 MIL/uL — ABNORMAL LOW (ref 4.22–5.81)
RDW: 23.3 % — ABNORMAL HIGH (ref 11.5–15.5)
WBC: 15.3 10*3/uL — AB (ref 4.0–10.5)

## 2016-02-25 LAB — PROTIME-INR
INR: 1.57
Prothrombin Time: 18.9 seconds — ABNORMAL HIGH (ref 11.4–15.2)

## 2016-02-25 LAB — GLUCOSE, CAPILLARY
Glucose-Capillary: 125 mg/dL — ABNORMAL HIGH (ref 65–99)
Glucose-Capillary: 206 mg/dL — ABNORMAL HIGH (ref 65–99)

## 2016-02-25 MED ORDER — ENSURE ENLIVE PO LIQD
237.0000 mL | Freq: Three times a day (TID) | ORAL | Status: DC
  Administered 2016-02-25: 237 mL via ORAL

## 2016-02-25 MED ORDER — CHLORHEXIDINE GLUCONATE CLOTH 2 % EX PADS
6.0000 | MEDICATED_PAD | Freq: Every day | CUTANEOUS | Status: DC
  Administered 2016-02-25: 6 via TOPICAL

## 2016-02-25 MED ORDER — MUPIROCIN 2 % EX OINT
1.0000 "application " | TOPICAL_OINTMENT | Freq: Two times a day (BID) | CUTANEOUS | Status: DC
  Administered 2016-02-25: 1 via NASAL
  Filled 2016-02-25: qty 22

## 2016-02-25 MED ORDER — HEPARIN SOD (PORK) LOCK FLUSH 100 UNIT/ML IV SOLN
500.0000 [IU] | INTRAVENOUS | Status: DC
  Administered 2016-02-25: 500 [IU]
  Filled 2016-02-25: qty 5

## 2016-02-25 MED ORDER — WARFARIN SODIUM 5 MG PO TABS
7.5000 mg | ORAL_TABLET | Freq: Once | ORAL | Status: AC
  Administered 2016-02-25: 7.5 mg via ORAL
  Filled 2016-02-25: qty 2

## 2016-02-25 MED ORDER — HYDROCODONE-ACETAMINOPHEN 7.5-325 MG PO TABS
1.0000 | ORAL_TABLET | Freq: Once | ORAL | Status: AC
  Administered 2016-02-25: 1 via ORAL
  Filled 2016-02-25: qty 1

## 2016-02-25 MED ORDER — HEPARIN SOD (PORK) LOCK FLUSH 100 UNIT/ML IV SOLN: 500.0000 [IU] | INTRAVENOUS | Status: DC | PRN

## 2016-02-25 NOTE — Progress Notes (Signed)
Nutrition Follow-up  DOCUMENTATION CODES:  Severe malnutrition in context of chronic illness  INTERVENTION:  Ensure Enlive po TID, each supplement provides 350 kcal and 20 grams of protein   Gave diet education for weight maintenance with associated handouts "Making the Most of Each Bite"   Supplement Coupons  Will refer to RD at [place]:  Malnutrition related to cancer and cancer related treatments as evidenced by severe depletion of muscle mass, loss of ATLEAST 10% bw in last 2 months.   GOAL:  Patient will meet greater than or equal to 90% of their needs   MONITOR:  PO intake, Supplement acceptance, Labs, Weight trends, I & O's  ASSESSMENT:  60 y/o male PMHx HTN, HLD, MI, DM, CAD, HF and Stage 4 lung cancer w/ malignant pleural effuspion s/p pleural cath placement, currently undergoing chemotherapy. Presented with sudden SOB. Worked up for acute resp failure related to HF and complicated by pleural effusion.   RD following up from remote assessment due to perceived severity of malnutrition and to offer education on weight maintenance.   Pt reports that the fluid that builds up in his lungs and abdomen compresses his stomach and takes away his appetite. He says he has an astoundingly good appetite after the fluid is removed. For example, he had a large paracentesis performed yesterday and says since that time he has been eating everything that he can. He says he can feel the fluid rebuilding within hours of thora/paracentesis  At home, his daughter prepares his meals and he will eat whatever she makes. She makes him 3 meals a day. He will snack on mostly carbohydrates in between meals such as "cakes" or candy. He Drinks 3 ensure Supplements a day. Other than Ensure, he drinks mostly water (up to 10 glasses a day).   RD emphasized that because his appetite is less when he has swelling, he needs to prioritize calorie/protein dense foods. RD went through dietary  recall and either gave approval or detailed how to increase kcal/pro content of pts meals or the food items reported. His breakfast typically contains eggs and breakfast meats. His lunch is typically a sandwich and his dinner usually contains chicken.   RD explained concept of never eating a food by itself. He should add toppings, condiments, dressings, creams, syrups etc to her meals. Examples given are adding whipped cream, chocolate syrup fruit to pancakes, adding regular mayo and mustard to sandwiches and adding extra gravy and butter to his mashed potatoes.  These items are very calorie dense and take of very up very little space in the stomach.   RD recommend switching out some of his water for a calorie containing beverage, ideally one that contains protein such as milk. He says he doesn't want to drink sugary beverages because it shoots his sugars up and doesn't drink milk w/o cereal. RD emphasized that he should not be preoccupied with tight BG control as his weight/Cancer treatment are more important.   Finally recommended alternative snacks that are protein containing such as PB cracker, HB egg, Half sandwich, snickers/reeses (if he wants candy)  Pt denies any side effects from chemo such as n/v/c/d or pains.   Pt is seen at [facility]. Given his large Ensure Intake, RD spoke of the Ensure assistance program that can prvide him with free Ensure. He was interested.   RD will notify [facility] RD of patient.   Physical exam limited due to ascites and generalized edema, however he does  show mild-moderate orbital and temporal wasting.   Medications: Ensure Enlive BID, Lasix, hydrocodone, kcl, insulin, warfarin, miralax Labs: wbc:15.3, h/h:8/24.8. Albumin: 1.7,    Recent Labs Lab 02/23/16 0809 02/24/16 0503 02/25/16 0500  NA 138 138 136  K 3.8 3.7 3.7  CL 105 104 104  CO2 '27 29 28  '$ BUN '20 19 16  '$ CREATININE 0.95 0.86 0.77  CALCIUM 8.0* 8.2* 7.8*  MG 1.4*  --   --   GLUCOSE 103*  90 104*   Diet Order:  Diet - low sodium heart healthy Diet Carb Modified Diet regular Room service appropriate? Yes; Fluid consistency: Thin  Skin:  PU stage 2 to buttocks, Diabetic ulcer to L ankle, MSAD to buttocks; coccyx. Skin tear to left leg, Cracking bilateral legs, Diabetic ulcer to R ankle and leg  Last BM:  11/27  Height:  Ht Readings from Last 1 Encounters:  02/21/16 '5\' 6"'$  (1.676 m)   Weight:  Wt Readings from Last 1 Encounters:  02/25/16 173 lb 4.5 oz (78.6 kg)   Wt Readings from Last 10 Encounters:  02/25/16 173 lb 4.5 oz (78.6 kg)  02/18/16 181 lb (82.1 kg)  02/17/16 181 lb 4.8 oz (82.2 kg)  02/07/16 181 lb 12.8 oz (82.5 kg)  01/28/16 211 lb (95.7 kg)  01/15/16 211 lb (95.7 kg)  01/09/16 212 lb (96.2 kg)  01/01/16 212 lb 6.4 oz (96.3 kg)  12/31/15 206 lb (93.4 kg)  12/30/15 212 lb (96.2 kg)   Ideal Body Weight:  64.54 kg  BMI:  Body mass index is 27.97 kg/m.  Estimated Nutritional Needs:  Kcal:  2000-2000 (26-28 kcal/kg bw) Protein:  90-105 g (1.4-1.6 g/kg ibw) Fluid:  Per MD  EDUCATION NEEDS:  No education needs identified at this time   Burtis Junes RD, LDN, [place] Clinical Nutrition Pager: [phone] 02/25/2016 11:46 AM

## 2016-02-25 NOTE — Progress Notes (Signed)
PROGRESS NOTE  Cameron Chandler FGH:[id number] DOB: 16-Jul-1955 DOA: 02/21/2016 PCP: [name], MD  Brief Narrative: 60 year old man PMH lung cancer currently undergoing chemotherapy, malignant right pleural effusion status post pleural catheter placement, recurrent left pleural effusion, chronic diastolic heart failure, presented to the emergency department with sudden onset of shortness of breath. Initial evaluation suggested acute on chronic diastolic heart failure, acute hypoxic respiratory failure initially requiring facemask and left pleural effusion.  Assessment/Plan: 1. Acute hypoxic respiratory failure secondary to acute heart failure, left pleural effusion, ascites. Much improved. Close to baseline. 2. Abdominal distension, pain. Resolved s/p paracentesis removing 3.5 liters of fluid. No clinical evidence of ileus. Bowels moving. No pain.  3. Acute on chronic diastolic congestive heart failure. Well-compensated. 4. Left pleural effusion, recurrent; much improved status post thoracentesis. Asymptomatic.  5. MRSA pneumonia present on admission, completed treatment 11/24. 6. 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 Has follow-up with thoracic surgery mid December. Currently undergoing chemotherapy, for cycle October 16. 7. Diabetes mellitus type 2 on metformin, Levemir. Consider risk/benefit metformin as an outpatient. Stable. 8. PMH seizure disorder, remote; colon cancer, left upper extremity DVT currently on anticoagulation. 9. Tobacco use disorder, cigarettes, ongoing; recommended cessation 10. Severe malnutrition    Doing well, plan home today with outpatient f/u with thoracic surgery  Started on home oxygen: COPD, lung cancer, malignant pleural effusion  F/u peritoneal fluid culture, suspect fluid secondary to malignancy, no h/o cirrhosis, no history or clinical features to suggest  SBP.   DVT prophylaxis: warfarin Code Status: full code Family Communication: No family at bedside Disposition Plan: home  Murray Hodgkins, MD  [facility] Direct contact: [phone] --[place] [place]  --[email or web address]; password [email or web address]  7PM-7AM contact night coverage as above 02/25/2016, 2:50 PM  LOS: 4 days   Consultants:  Radiology  Procedures:  Ultrasound-guided left thoracentesis, 1.5 to liters cloudy red colored fluid removed.   US guided paracentesis 3.4 L  Antimicrobials:  Vancomycin 11/24  Interval history/Subjective: Feeling well, breathing well, hungry, no abd pain. Bowels moving.  Objective: Vitals:   02/24/16 2136 02/25/16 0500 02/25/16 1120 02/25/16 1429  BP: 136/89 113/88  100/69  Pulse: (!) 104 100  (!) 105  Resp: '20 20  18  '$ Temp: 97.3 F (36.3 C) 98.1 F (36.7 C)  97.9 F (36.6 C)  TempSrc: Oral Oral  Oral  SpO2: 92% 100% 96% 100%  Weight:  78.6 kg (173 lb 4.5 oz)    Height:        Intake/Output Summary (Last 24 hours) at 02/25/16 1450 Last data filed at 02/25/16 1314  Gross per 24 hour  Intake          1016.67 ml  Output              550 ml  Net           466.67 ml     Filed Weights   02/23/16 0415 02/24/16 0500 02/25/16 0500  Weight: 80 kg (176 lb 5.9 oz) 79.2 kg (174 lb 9.7 oz) 78.6 kg (173 lb 4.5 oz)    Exam: Constitutional:   Appears well. Respiratory:   CTA on left, few crackles on right, normal resp effort, no wheezes or rhonchi. Cardiovascular:   RRR no m/r/g.  No LE edema Abdomen:   Soft, less distended, nontender Psychiatric:   Alert, speech fluent and clear, grossly normal mood and affect  I have personally reviewed following labs and imaging studies:  Potassium 3.7. Remainder BMP unremarkable.  WBC down to 15.3 This is a chronic finding. Hemoglobin stable 8.0  Peritoneal fluid noted  Scheduled Meds: . aspirin EC  81 mg Oral Daily  . Chlorhexidine Gluconate Cloth  6 each Topical Q0600  .  feeding supplement (ENSURE ENLIVE)  237 mL Oral TID BM  . furosemide  40 mg Oral Daily  . gabapentin  300 mg Oral TID  . HYDROcodone-acetaminophen  1 tablet Oral TID  . insulin aspart  0-5 Units Subcutaneous QHS  . insulin aspart  0-9 Units Subcutaneous TID WC  . levothyroxine  25 mcg Oral QAC breakfast  . mupirocin ointment  1 application Nasal BID  . polyethylene glycol  17 g Oral BID  . sodium chloride flush  3 mL Intravenous Q12H  . warfarin  7.5 mg Oral Once  . Warfarin - Pharmacist Dosing Inpatient   Does not apply Q24H   Continuous Infusions: . dextrose 5 % and 0.45 % NaCl with KCl 40 mEq/L 100 mL/hr at 02/25/16 9276    Principal Problem:   Acute on chronic diastolic CHF (congestive heart failure) (HCC) Active Problems:   Cigarette smoker   Adenocarcinoma of right lung, stage 4 (HCC)   Acute respiratory failure with hypoxia (HCC)   DVT of upper extremity (deep vein thrombosis) ([place])   Long term current use of anticoagulant therapy   Pulmonary edema   LOS: 4 days

## 2016-02-25 NOTE — Progress Notes (Signed)
Patient ID: Cameron Chandler, male   DOB: 01/04/1956, 60 y.o.   MRN: [id number] Nursing staff reported that the patient has been having lower extremity neuropathic pain and is unable to sleep despite being given Neurontin, acetaminophen and his last scheduled Norco in the evening. An extra dose of Norco 7.5-325 mg was ordered.   Tennis Must, MD [phone].

## 2016-02-25 NOTE — Progress Notes (Signed)
SATURATION QUALIFICATIONS: (This note is used to comply with regulatory documentation for home oxygen)  Patient Saturations on Room Air at Rest = 86%  Patient Saturations on Room Air while Ambulating = 84%  Patient Saturations on 2 Liters of oxygen while Ambulating = 93%  Please briefly explain why patient needs home oxygen: Patient O2 saturations drops without oxygen

## 2016-02-25 NOTE — Progress Notes (Signed)
Patient with orders to be discharge home. Discharge instructions given, patient and daughter verbalized understanding. Patient stable. Patient left in private vehicle with daughter.

## 2016-02-25 NOTE — Progress Notes (Signed)
[facility] for Coumadin Indication: DVT  Allergies  Allergen Reactions  . No Known Allergies     Patient Measurements: Height: '5\' 6"'$  (167.6 cm) Weight: 173 lb 4.5 oz (78.6 kg) IBW/kg (Calculated) : 63.8   Vital Signs: Temp: 98.1 F (36.7 C) (11/28 0500) Temp Source: Oral (11/28 0500) BP: 113/88 (11/28 0500) Pulse Rate: 100 (11/28 0500)  Labs:  Recent Labs  02/23/16 0809 02/24/16 0503 02/25/16 0500  HGB 8.7* 8.6* 8.0*  HCT 27.7* 27.3* 24.8*  PLT 505* 523* 507*  LABPROT 16.9* 17.7* 18.9*  INR 1.37 1.44 1.57  CREATININE 0.95 0.86 0.77    Estimated Creatinine Clearance: 94.4 mL/min (by C-G formula based on SCr of 0.77 mg/dL).   Medical History: Past Medical History:  Diagnosis Date  . Adenocarcinoma of right lung, stage 4 ([place]) 12/03/2015  . Arthritis    back, joint pain  . Bone cancer ([place])    T 1T2  . Cancer [facility]) 2011   colon cancer  . Colon cancer [facility])    Colon resection and Chemotherapy  . Coronary atherosclerosis of native coronary artery   . Diabetes mellitus without complication ([place])    takes Meformin and Levemir daily  . DVT (deep venous thrombosis) (HCC)    Left arm  . Dyspnea    lying,sitting,exertion  . Heart attack   . Hypothyroidism    takes Synthroid daily  . Myocardial infarction 2007  . Nocturia   . Other and unspecified hyperlipidemia    takes Simvastatin daily  . Prostate cancer ([place]) 12/13/12   ,Radiation  . Seizures ([place])    hx of-had one over 20 yrs ago  . Tingling    hands  . Unspecified essential hypertension    takes Diovan and Metoprolol daily  . Weakness    in legs    Medications:  Prescriptions Prior to Admission  Medication Sig Dispense Refill Last Dose  . aspirin EC 81 MG tablet Take 81 mg by mouth daily.   Past Month at Unknown time  . dexamethasone (DECADRON) 4 MG tablet 4 mg by mouth twice a day the day before, day of and day after the chemotherapy every 3 weeks (Patient  taking differently: Take 4 mg by mouth 2 (two) times daily. 4 mg by mouth twice a day the day before, day of and day after the chemotherapy every 3 weeks) 40 tablet 1 Past Week at Unknown time  . feeding supplement, ENSURE ENLIVE, (ENSURE ENLIVE) LIQD Take 237 mLs by mouth 2 (two) times daily between meals. 237 mL 12 Past Month at Unknown time  . Fenofibric Acid 105 MG TABS Take 1 tablet by mouth daily.   Past Week at Unknown time  . folic acid (FOLVITE) 1 MG tablet Take 1 tablet (1 mg total) by mouth daily. 30 tablet 4 Past Week at Unknown time  . furosemide (LASIX) 40 MG tablet Take 1 tablet (40 mg total) by mouth daily. 14 tablet 1 Past Week at Unknown time  . hydrochlorothiazide (HYDRODIURIL) 25 MG tablet Take 25 mg by mouth daily.   Past Week at Unknown time  . HYDROcodone-acetaminophen (NORCO) 7.5-325 MG tablet Take 1 tablet by mouth 3 (three) times daily. 20 tablet 0 02/21/2016 at Unknown time  . insulin detemir (LEVEMIR) 100 unit/ml SOLN Inject 30 Units into the skin at bedtime.   Past Week at Unknown time  . levothyroxine (SYNTHROID, LEVOTHROID) 25 MCG tablet Take 1 tablet (25 mcg total) by mouth daily  before breakfast. 30 tablet 11 Past Week at Unknown time  . lidocaine-prilocaine (EMLA) cream Apply 1 application topically as needed. (Patient taking differently: Apply 1 application topically as needed (for port access). ) 30 g 0 Past Month at Unknown time  . metFORMIN (GLUMETZA) 500 MG (MOD) 24 hr tablet Take 1,000 mg by mouth 2 (two) times daily with a meal.   Past Week at Unknown time  . metoprolol tartrate (LOPRESSOR) 25 MG tablet Take 12.5 mg by mouth 2 (two) times daily.   Past Week at Unknown time  . potassium chloride (K-DUR,KLOR-CON) 10 MEQ tablet Take 1 tablet (10 mEq total) by mouth daily. 14 tablet 1 Past Week at Unknown time  . prochlorperazine (COMPAZINE) 10 MG tablet Take 1 tablet (10 mg total) by mouth every 6 (six) hours as needed for nausea or vomiting. 30 tablet 0 Past Week  at Unknown time  . simvastatin (ZOCOR) 80 MG tablet TAKE 1 TABLET BY MOUTH DAILY AT 6 P.M. 30 tablet 3 Past Week at Unknown time  . valsartan (DIOVAN) 160 MG tablet Take 160 mg by mouth daily.   Past Week at Unknown time  . [EXPIRED] vancomycin (VANCOCIN) 1-5 GM/200ML-% SOLN Inject 200 mLs (1,000 mg total) into the vein daily. 3000 mL 0 Past Month at Unknown time  . warfarin (COUMADIN) 5 MG tablet Take 5 mg by mouth daily.   1 Past Week at Unknown time  . Wound Dressings (SONAFINE) Apply 1 application topically daily.   Past Week at Unknown time  . [DISCONTINUED] gabapentin (NEURONTIN) 100 MG capsule Take 1 capsule (100 mg total) by mouth 3 (three) times daily. (Patient taking differently: Take 300 mg by mouth 3 (three) times daily. ) 90 capsule 0 Past Week at Unknown time  . meloxicam (MOBIC) 7.5 MG tablet Take 7.5 mg by mouth daily.    at unknown  . nitroGLYCERIN (NITROSTAT) 0.4 MG SL tablet Place 0.4 mg under the tongue every 5 (five) minutes as needed for chest pain.    at unknown   Assessment: Continuation of Coumadin PTA for DVT in LUE PTA Coumadin dose 5 mg daily as noted above.  INR is below goal today.    Goal of Therapy:  INR 2-3 Monitor platelets by anticoagulation protocol: Yes   Plan:  Coumadin 7.5 mg po x 1 dose today INR/PT daily CBC daily Monitor for sign of bleeding  Hart Robinsons A 02/25/2016,1:39 PM

## 2016-02-26 ENCOUNTER — Other Ambulatory Visit: Payer: Self-pay | Admitting: Medical Oncology

## 2016-02-26 ENCOUNTER — Telehealth: Payer: Self-pay | Admitting: Medical Oncology

## 2016-02-26 ENCOUNTER — Telehealth: Payer: Self-pay | Admitting: *Deleted

## 2016-02-26 DIAGNOSIS — C3491 Malignant neoplasm of unspecified part of right bronchus or lung: Secondary | ICD-10-CM

## 2016-02-26 LAB — CULTURE, BODY FLUID-BOTTLE: CULTURE: NO GROWTH

## 2016-02-26 LAB — CULTURE, BLOOD (ROUTINE X 2)
CULTURE: NO GROWTH
CULTURE: NO GROWTH

## 2016-02-26 NOTE — Telephone Encounter (Signed)
I told [name] that Cameron Chandler does not need to see pt before dec f/u appt.

## 2016-02-26 NOTE — Telephone Encounter (Signed)
Pt's daughter called stating that her schedule has changed and needs to change her dad's appts.  I rescheduled his lab appt from 12/4 to 12/7 & requested for when she brought him in to see someone in scheduling to have the other appts she needs changed.

## 2016-03-02 ENCOUNTER — Other Ambulatory Visit: Payer: Medicare HMO

## 2016-03-02 ENCOUNTER — Other Ambulatory Visit: Payer: Self-pay | Admitting: Thoracic Surgery (Cardiothoracic Vascular Surgery)

## 2016-03-02 DIAGNOSIS — C3490 Malignant neoplasm of unspecified part of unspecified bronchus or lung: Secondary | ICD-10-CM

## 2016-03-03 ENCOUNTER — Encounter: Payer: Medicare HMO | Admitting: Thoracic Surgery (Cardiothoracic Vascular Surgery)

## 2016-03-04 LAB — CULTURE, BODY FLUID W GRAM STAIN -BOTTLE: Culture: NO GROWTH

## 2016-03-04 LAB — CULTURE, BODY FLUID-BOTTLE

## 2016-03-05 ENCOUNTER — Other Ambulatory Visit (HOSPITAL_BASED_OUTPATIENT_CLINIC_OR_DEPARTMENT_OTHER): Payer: Medicare HMO

## 2016-03-05 ENCOUNTER — Other Ambulatory Visit: Payer: Self-pay | Admitting: Internal Medicine

## 2016-03-05 DIAGNOSIS — C3411 Malignant neoplasm of upper lobe, right bronchus or lung: Secondary | ICD-10-CM | POA: Diagnosis not present

## 2016-03-05 DIAGNOSIS — C3491 Malignant neoplasm of unspecified part of right bronchus or lung: Secondary | ICD-10-CM

## 2016-03-05 LAB — COMPREHENSIVE METABOLIC PANEL
ALK PHOS: 93 U/L (ref 40–150)
ALT: 23 U/L (ref 0–55)
ANION GAP: 10 meq/L (ref 3–11)
AST: 29 U/L (ref 5–34)
Albumin: <2 g/dL — ABNORMAL LOW (ref 3.5–5.0)
BUN: 12.6 mg/dL (ref 7.0–26.0)
CALCIUM: 8.5 mg/dL (ref 8.4–10.4)
CO2: 26 mEq/L (ref 22–29)
CREATININE: 0.8 mg/dL (ref 0.7–1.3)
Chloride: 109 mEq/L (ref 98–109)
Glucose: 100 mg/dl (ref 70–140)
Potassium: 2.8 mEq/L — CL (ref 3.5–5.1)
Sodium: 145 mEq/L (ref 136–145)
TOTAL PROTEIN: 6.3 g/dL — AB (ref 6.4–8.3)

## 2016-03-05 LAB — CBC WITH DIFFERENTIAL/PLATELET
BASO%: 0.1 % (ref 0.0–2.0)
Basophils Absolute: 0 10*3/uL (ref 0.0–0.1)
EOS ABS: 0.1 10*3/uL (ref 0.0–0.5)
EOS%: 0.5 % (ref 0.0–7.0)
HEMATOCRIT: 28 % — AB (ref 38.4–49.9)
HGB: 8.8 g/dL — ABNORMAL LOW (ref 13.0–17.1)
LYMPH#: 0.8 10*3/uL — AB (ref 0.9–3.3)
LYMPH%: 5.3 % — ABNORMAL LOW (ref 14.0–49.0)
MCH: 25.4 pg — ABNORMAL LOW (ref 27.2–33.4)
MCHC: 31.4 g/dL — ABNORMAL LOW (ref 32.0–36.0)
MCV: 80.7 fL (ref 79.3–98.0)
MONO#: 1.3 10*3/uL — AB (ref 0.1–0.9)
MONO%: 8.6 % (ref 0.0–14.0)
NEUT%: 85.5 % — AB (ref 39.0–75.0)
NEUTROS ABS: 12.7 10*3/uL — AB (ref 1.5–6.5)
PLATELETS: 426 10*3/uL — AB (ref 140–400)
RBC: 3.47 10*6/uL — ABNORMAL LOW (ref 4.20–5.82)
RDW: 21.8 % — ABNORMAL HIGH (ref 11.0–14.6)
WBC: 14.9 10*3/uL — AB (ref 4.0–10.3)

## 2016-03-05 LAB — DRAW EXTRA CLOT TUBE

## 2016-03-05 MED ORDER — POTASSIUM CHLORIDE CRYS ER 20 MEQ PO TBCR: 40.0000 meq | EXTENDED_RELEASE_TABLET | Freq: Every day | ORAL | 0 refills | Status: AC

## 2016-03-05 NOTE — Progress Notes (Signed)
CHART NOTE Lab today showed low potassium of 2.8. I sent prescription for KCl 40 mEq by mouth daily to his pharmacy. I told the patient and his daughter to start the medication today.

## 2016-03-09 ENCOUNTER — Other Ambulatory Visit: Payer: Medicare HMO

## 2016-03-09 ENCOUNTER — Telehealth: Payer: Self-pay | Admitting: Nurse Practitioner

## 2016-03-09 NOTE — Telephone Encounter (Signed)
Lab appointment rescheduled per patient request. Patient needed a late afternoon appointment on a Thursday.... Work schedule has changed.

## 2016-03-10 DIAGNOSIS — J910 Malignant pleural effusion: Secondary | ICD-10-CM | POA: Diagnosis not present

## 2016-03-10 DIAGNOSIS — C3490 Malignant neoplasm of unspecified part of unspecified bronchus or lung: Secondary | ICD-10-CM | POA: Diagnosis not present

## 2016-03-10 LAB — ECHOCARDIOGRAM COMPLETE
Height: 66 in
Weight: 3281.6 oz

## 2016-03-12 ENCOUNTER — Other Ambulatory Visit: Payer: Medicare HMO

## 2016-03-13 ENCOUNTER — Emergency Department (HOSPITAL_COMMUNITY): Payer: Medicare HMO

## 2016-03-13 ENCOUNTER — Inpatient Hospital Stay (HOSPITAL_COMMUNITY): Payer: Medicare HMO

## 2016-03-13 ENCOUNTER — Inpatient Hospital Stay (HOSPITAL_COMMUNITY)
Admission: EM | Admit: 2016-03-13 | Discharge: 2016-03-14 | DRG: 180 | Disposition: A | Discharge status: Hospice | Payer: Medicare HMO | Attending: Internal Medicine | Admitting: Internal Medicine

## 2016-03-13 ENCOUNTER — Other Ambulatory Visit: Payer: Medicare HMO

## 2016-03-13 ENCOUNTER — Encounter (HOSPITAL_COMMUNITY): Payer: Self-pay | Admitting: Emergency Medicine

## 2016-03-13 DIAGNOSIS — C189 Malignant neoplasm of colon, unspecified: Secondary | ICD-10-CM | POA: Diagnosis present

## 2016-03-13 DIAGNOSIS — Z85038 Personal history of other malignant neoplasm of large intestine: Secondary | ICD-10-CM

## 2016-03-13 DIAGNOSIS — E119 Type 2 diabetes mellitus without complications: Secondary | ICD-10-CM | POA: Diagnosis present

## 2016-03-13 DIAGNOSIS — Z515 Encounter for palliative care: Secondary | ICD-10-CM | POA: Diagnosis present

## 2016-03-13 DIAGNOSIS — J910 Malignant pleural effusion: Secondary | ICD-10-CM | POA: Diagnosis present

## 2016-03-13 DIAGNOSIS — Z791 Long term (current) use of non-steroidal anti-inflammatories (NSAID): Secondary | ICD-10-CM

## 2016-03-13 DIAGNOSIS — E876 Hypokalemia: Secondary | ICD-10-CM | POA: Diagnosis present

## 2016-03-13 DIAGNOSIS — E785 Hyperlipidemia, unspecified: Secondary | ICD-10-CM | POA: Diagnosis present

## 2016-03-13 DIAGNOSIS — Z9861 Coronary angioplasty status: Secondary | ICD-10-CM

## 2016-03-13 DIAGNOSIS — I2510 Atherosclerotic heart disease of native coronary artery without angina pectoris: Secondary | ICD-10-CM | POA: Diagnosis present

## 2016-03-13 DIAGNOSIS — Z9889 Other specified postprocedural states: Secondary | ICD-10-CM

## 2016-03-13 DIAGNOSIS — Z7984 Long term (current) use of oral hypoglycemic drugs: Secondary | ICD-10-CM

## 2016-03-13 DIAGNOSIS — J9621 Acute and chronic respiratory failure with hypoxia: Secondary | ICD-10-CM | POA: Diagnosis present

## 2016-03-13 DIAGNOSIS — Z794 Long term (current) use of insulin: Secondary | ICD-10-CM

## 2016-03-13 DIAGNOSIS — E43 Unspecified severe protein-calorie malnutrition: Secondary | ICD-10-CM | POA: Diagnosis present

## 2016-03-13 DIAGNOSIS — Z66 Do not resuscitate: Secondary | ICD-10-CM | POA: Diagnosis present

## 2016-03-13 DIAGNOSIS — J9601 Acute respiratory failure with hypoxia: Secondary | ICD-10-CM | POA: Diagnosis present

## 2016-03-13 DIAGNOSIS — I10 Essential (primary) hypertension: Secondary | ICD-10-CM | POA: Diagnosis present

## 2016-03-13 DIAGNOSIS — C771 Secondary and unspecified malignant neoplasm of intrathoracic lymph nodes: Secondary | ICD-10-CM | POA: Diagnosis present

## 2016-03-13 DIAGNOSIS — Z86718 Personal history of other venous thrombosis and embolism: Secondary | ICD-10-CM

## 2016-03-13 DIAGNOSIS — C61 Malignant neoplasm of prostate: Secondary | ICD-10-CM | POA: Diagnosis present

## 2016-03-13 DIAGNOSIS — R188 Other ascites: Secondary | ICD-10-CM | POA: Diagnosis present

## 2016-03-13 DIAGNOSIS — R0602 Shortness of breath: Secondary | ICD-10-CM | POA: Diagnosis present

## 2016-03-13 DIAGNOSIS — Z8546 Personal history of malignant neoplasm of prostate: Secondary | ICD-10-CM

## 2016-03-13 DIAGNOSIS — R079 Chest pain, unspecified: Secondary | ICD-10-CM | POA: Diagnosis present

## 2016-03-13 DIAGNOSIS — Z981 Arthrodesis status: Secondary | ICD-10-CM

## 2016-03-13 DIAGNOSIS — Z7901 Long term (current) use of anticoagulants: Secondary | ICD-10-CM

## 2016-03-13 DIAGNOSIS — I252 Old myocardial infarction: Secondary | ICD-10-CM

## 2016-03-13 DIAGNOSIS — Z6827 Body mass index (BMI) 27.0-27.9, adult: Secondary | ICD-10-CM | POA: Diagnosis not present

## 2016-03-13 DIAGNOSIS — Z9221 Personal history of antineoplastic chemotherapy: Secondary | ICD-10-CM | POA: Diagnosis not present

## 2016-03-13 DIAGNOSIS — E039 Hypothyroidism, unspecified: Secondary | ICD-10-CM | POA: Diagnosis present

## 2016-03-13 DIAGNOSIS — K7460 Unspecified cirrhosis of liver: Secondary | ICD-10-CM | POA: Diagnosis present

## 2016-03-13 DIAGNOSIS — C3491 Malignant neoplasm of unspecified part of right bronchus or lung: Secondary | ICD-10-CM

## 2016-03-13 DIAGNOSIS — F17210 Nicotine dependence, cigarettes, uncomplicated: Secondary | ICD-10-CM | POA: Diagnosis present

## 2016-03-13 DIAGNOSIS — Z7982 Long term (current) use of aspirin: Secondary | ICD-10-CM

## 2016-03-13 DIAGNOSIS — J810 Acute pulmonary edema: Secondary | ICD-10-CM

## 2016-03-13 DIAGNOSIS — C3411 Malignant neoplasm of upper lobe, right bronchus or lung: Principal | ICD-10-CM | POA: Diagnosis present

## 2016-03-13 DIAGNOSIS — G959 Disease of spinal cord, unspecified: Secondary | ICD-10-CM | POA: Diagnosis present

## 2016-03-13 DIAGNOSIS — C7951 Secondary malignant neoplasm of bone: Secondary | ICD-10-CM | POA: Diagnosis present

## 2016-03-13 DIAGNOSIS — J90 Pleural effusion, not elsewhere classified: Secondary | ICD-10-CM | POA: Diagnosis present

## 2016-03-13 DIAGNOSIS — Z79891 Long term (current) use of opiate analgesic: Secondary | ICD-10-CM

## 2016-03-13 LAB — BASIC METABOLIC PANEL
ANION GAP: 10 (ref 5–15)
BUN: 15 mg/dL (ref 6–20)
CALCIUM: 8 mg/dL — AB (ref 8.9–10.3)
CO2: 26 mmol/L (ref 22–32)
CREATININE: 0.7 mg/dL (ref 0.61–1.24)
Chloride: 105 mmol/L (ref 101–111)
GFR calc Af Amer: >60 mL/min (ref >60)
GLUCOSE: 95 mg/dL (ref 65–99)
Potassium: 3.1 mmol/L — ABNORMAL LOW (ref 3.5–5.1)
Sodium: 141 mmol/L (ref 135–145)

## 2016-03-13 LAB — CBC WITH DIFFERENTIAL/PLATELET
Basophils Absolute: 0 10*3/uL (ref 0.0–0.1)
Basophils Relative: 0 %
EOS PCT: 1 %
Eosinophils Absolute: 0.2 10*3/uL (ref 0.0–0.7)
HCT: 30 % — ABNORMAL LOW (ref 39.0–52.0)
Hemoglobin: 9.2 g/dL — ABNORMAL LOW (ref 13.0–17.0)
LYMPHS ABS: 0.9 10*3/uL (ref 0.7–4.0)
Lymphocytes Relative: 6 %
MCH: 25.3 pg — AB (ref 26.0–34.0)
MCHC: 30.7 g/dL (ref 30.0–36.0)
MCV: 82.6 fL (ref 78.0–100.0)
MONO ABS: 1.4 10*3/uL — AB (ref 0.1–1.0)
Monocytes Relative: 9 %
Neutro Abs: 13.3 10*3/uL — ABNORMAL HIGH (ref 1.7–7.7)
Neutrophils Relative %: 84 %
PLATELETS: 254 10*3/uL (ref 150–400)
RBC: 3.63 MIL/uL — AB (ref 4.22–5.81)
RDW: 21.5 % — AB (ref 11.5–15.5)
Smear Review: ADEQUATE
WBC: 15.8 10*3/uL — AB (ref 4.0–10.5)

## 2016-03-13 LAB — GLUCOSE, CAPILLARY
GLUCOSE-CAPILLARY: 147 mg/dL — AB (ref 65–99)
GLUCOSE-CAPILLARY: 132 mg/dL — AB (ref 65–99)

## 2016-03-13 LAB — TROPONIN I: Troponin I: <0.03 ng/mL (ref <0.03)

## 2016-03-13 LAB — PROTIME-INR
INR: 1.24
Prothrombin Time: 15.7 seconds — ABNORMAL HIGH (ref 11.4–15.2)

## 2016-03-13 MED ORDER — HYDROCODONE-ACETAMINOPHEN 7.5-325 MG/15ML PO SOLN: 10.0000 mL | Freq: Once | ORAL | Status: DC

## 2016-03-13 MED ORDER — HYDROCODONE-ACETAMINOPHEN 7.5-325 MG PO TABS
1.0000 | ORAL_TABLET | Freq: Three times a day (TID) | ORAL | Status: DC
  Administered 2016-03-13 – 2016-03-14 (×3): 1 via ORAL
  Filled 2016-03-13 (×3): qty 1

## 2016-03-13 MED ORDER — METOPROLOL TARTRATE 25 MG PO TABS
12.5000 mg | ORAL_TABLET | Freq: Two times a day (BID) | ORAL | Status: DC
  Administered 2016-03-13 – 2016-03-14 (×3): 12.5 mg via ORAL
  Filled 2016-03-13 (×3): qty 1

## 2016-03-13 MED ORDER — PROCHLORPERAZINE MALEATE 5 MG PO TABS: 10.0000 mg | ORAL_TABLET | Freq: Four times a day (QID) | ORAL | Status: DC | PRN

## 2016-03-13 MED ORDER — GABAPENTIN 300 MG PO CAPS
300.0000 mg | ORAL_CAPSULE | Freq: Three times a day (TID) | ORAL | Status: DC
  Administered 2016-03-13 – 2016-03-14 (×3): 300 mg via ORAL
  Filled 2016-03-13 (×3): qty 1

## 2016-03-13 MED ORDER — PRO-STAT SUGAR FREE PO LIQD
30.0000 mL | Freq: Two times a day (BID) | ORAL | Status: DC
  Administered 2016-03-13 – 2016-03-14 (×3): 30 mL via ORAL
  Filled 2016-03-13 (×3): qty 30

## 2016-03-13 MED ORDER — WARFARIN SODIUM 5 MG PO TABS
7.5000 mg | ORAL_TABLET | Freq: Once | ORAL | Status: AC
  Administered 2016-03-13: 7.5 mg via ORAL
  Filled 2016-03-13: qty 2

## 2016-03-13 MED ORDER — LEVOTHYROXINE SODIUM 25 MCG PO TABS
25.0000 ug | ORAL_TABLET | Freq: Every day | ORAL | Status: DC
  Administered 2016-03-14: 25 ug via ORAL
  Filled 2016-03-13: qty 1

## 2016-03-13 MED ORDER — ENSURE ENLIVE PO LIQD
237.0000 mL | Freq: Two times a day (BID) | ORAL | Status: DC
  Administered 2016-03-13 – 2016-03-14 (×2): 237 mL via ORAL

## 2016-03-13 MED ORDER — POTASSIUM CHLORIDE CRYS ER 20 MEQ PO TBCR
40.0000 meq | EXTENDED_RELEASE_TABLET | Freq: Once | ORAL | Status: AC
  Administered 2016-03-13: 40 meq via ORAL
  Filled 2016-03-13: qty 2

## 2016-03-13 MED ORDER — POTASSIUM CHLORIDE CRYS ER 20 MEQ PO TBCR
20.0000 meq | EXTENDED_RELEASE_TABLET | Freq: Once | ORAL | Status: DC
Start: 2016-03-13 — End: 2016-03-13

## 2016-03-13 MED ORDER — HYDROCODONE-ACETAMINOPHEN 7.5-325 MG PO TABS: 1.0000 | ORAL_TABLET | Freq: Once | ORAL | Status: DC

## 2016-03-13 MED ORDER — ALBUMIN HUMAN 25 % IV SOLN: 50.0000 g | Freq: Once | INTRAVENOUS | Status: DC | PRN

## 2016-03-13 MED ORDER — ALBUTEROL SULFATE (2.5 MG/3ML) 0.083% IN NEBU: 2.5000 mg | INHALATION_SOLUTION | RESPIRATORY_TRACT | Status: DC | PRN

## 2016-03-13 MED ORDER — INSULIN ASPART 100 UNIT/ML ~~LOC~~ SOLN
0.0000 [IU] | Freq: Three times a day (TID) | SUBCUTANEOUS | Status: DC
  Administered 2016-03-13: 1 [IU] via SUBCUTANEOUS

## 2016-03-13 MED ORDER — LIDOCAINE-PRILOCAINE 2.5-2.5 % EX CREA: 1.0000 "application " | TOPICAL_CREAM | CUTANEOUS | Status: DC | PRN

## 2016-03-13 MED ORDER — ATORVASTATIN CALCIUM 40 MG PO TABS
40.0000 mg | ORAL_TABLET | Freq: Every day | ORAL | Status: DC
  Administered 2016-03-13: 40 mg via ORAL
  Filled 2016-03-13: qty 1

## 2016-03-13 MED ORDER — ONDANSETRON HCL 4 MG PO TABS: 4.0000 mg | ORAL_TABLET | Freq: Four times a day (QID) | ORAL | Status: DC | PRN

## 2016-03-13 MED ORDER — DEXAMETHASONE 4 MG PO TABS
4.0000 mg | ORAL_TABLET | Freq: Two times a day (BID) | ORAL | Status: DC
  Administered 2016-03-13 – 2016-03-14 (×2): 4 mg via ORAL
  Filled 2016-03-13 (×6): qty 1

## 2016-03-13 MED ORDER — FOLIC ACID 1 MG PO TABS
1.0000 mg | ORAL_TABLET | Freq: Every day | ORAL | Status: DC
  Administered 2016-03-13 – 2016-03-14 (×2): 1 mg via ORAL
  Filled 2016-03-13 (×2): qty 1

## 2016-03-13 MED ORDER — BISACODYL 10 MG RE SUPP: 10.0000 mg | Freq: Every day | RECTAL | Status: DC | PRN

## 2016-03-13 MED ORDER — HYDROCHLOROTHIAZIDE 25 MG PO TABS
25.0000 mg | ORAL_TABLET | Freq: Every day | ORAL | Status: DC
  Administered 2016-03-13 – 2016-03-14 (×2): 25 mg via ORAL
  Filled 2016-03-13 (×2): qty 1

## 2016-03-13 MED ORDER — FUROSEMIDE 10 MG/ML IJ SOLN
20.0000 mg | Freq: Once | INTRAMUSCULAR | Status: AC
  Administered 2016-03-13: 20 mg via INTRAMUSCULAR
  Filled 2016-03-13: qty 2

## 2016-03-13 MED ORDER — FUROSEMIDE 40 MG PO TABS
40.0000 mg | ORAL_TABLET | Freq: Every day | ORAL | Status: DC
  Administered 2016-03-13 – 2016-03-14 (×2): 40 mg via ORAL
  Filled 2016-03-13 (×2): qty 1

## 2016-03-13 MED ORDER — HYDROCODONE-ACETAMINOPHEN 7.5-325 MG/15ML PO SOLN
ORAL | Status: AC
  Filled 2016-03-13: qty 15

## 2016-03-13 MED ORDER — WARFARIN - PHARMACIST DOSING INPATIENT
Freq: Every day | Status: DC
  Administered 2016-03-13: 16:00:00

## 2016-03-13 MED ORDER — MORPHINE SULFATE (PF) 2 MG/ML IV SOLN
2.0000 mg | INTRAVENOUS | Status: DC | PRN
  Administered 2016-03-14: 2 mg via INTRAVENOUS
  Filled 2016-03-13: qty 1

## 2016-03-13 MED ORDER — INSULIN DETEMIR 100 UNIT/ML ~~LOC~~ SOLN
30.0000 [IU] | Freq: Every day | SUBCUTANEOUS | Status: DC
  Administered 2016-03-13: 30 [IU] via SUBCUTANEOUS
  Filled 2016-03-13 (×3): qty 0.3

## 2016-03-13 MED ORDER — ONDANSETRON HCL 4 MG/2ML IJ SOLN: 4.0000 mg | Freq: Four times a day (QID) | INTRAMUSCULAR | Status: DC | PRN

## 2016-03-13 MED ORDER — ASPIRIN EC 81 MG PO TBEC
81.0000 mg | DELAYED_RELEASE_TABLET | Freq: Every day | ORAL | Status: DC
  Administered 2016-03-13 – 2016-03-14 (×2): 81 mg via ORAL
  Filled 2016-03-13 (×2): qty 1

## 2016-03-13 MED ORDER — POTASSIUM CHLORIDE CRYS ER 20 MEQ PO TBCR
40.0000 meq | EXTENDED_RELEASE_TABLET | Freq: Every day | ORAL | Status: DC
  Administered 2016-03-14: 40 meq via ORAL
  Filled 2016-03-13: qty 2

## 2016-03-13 MED ORDER — NITROGLYCERIN 0.4 MG SL SUBL: 0.4000 mg | SUBLINGUAL_TABLET | SUBLINGUAL | Status: DC | PRN

## 2016-03-13 MED ORDER — HYDROCODONE-ACETAMINOPHEN 7.5-325 MG/15ML PO SOLN
15.0000 mL | Freq: Once | ORAL | Status: AC
  Administered 2016-03-13: 15 mL via ORAL

## 2016-03-13 MED ORDER — INSULIN ASPART 100 UNIT/ML ~~LOC~~ SOLN: 0.0000 [IU] | Freq: Every day | SUBCUTANEOUS | Status: DC

## 2016-03-13 NOTE — ED Triage Notes (Signed)
Pt seeing cancer center for lung ca. Was ordered chest xray today for fluid overload. Hospice was coming to house at [place] today. Did not taken lasix yesterday or today. Woke up with cp this am. Nondipahoretic. nad at this time

## 2016-03-13 NOTE — ED Notes (Signed)
Pt taken to [place] who will take then to floor when done

## 2016-03-13 NOTE — Progress Notes (Signed)
Paracentesis complete no sings of distress. 1634m white colored ascites removed.

## 2016-03-13 NOTE — Progress Notes (Signed)
ANTICOAGULATION CONSULT NOTE - Initial Consult  Pharmacy Consult for Coumadin Indication: DVT  Allergies  Allergen Reactions  . No Known Allergies     Vital Signs: Temp: 98.1 F (36.7 C) (12/15 0925) Temp Source: Oral (12/15 0925) BP: 119/90 (12/15 1200) Pulse Rate: 121 (12/15 1200)  Labs:  Recent Labs  03/13/16 0952  HGB 9.2*  HCT 30.0*  PLT 254  LABPROT 15.7*  INR 1.24  CREATININE 0.70  TROPONINI <0.03    CrCl cannot be calculated (Unknown ideal weight.).   Medical History: Past Medical History:  Diagnosis Date  . Adenocarcinoma of right lung, stage 4 ([place]) 12/03/2015  . Arthritis    back, joint pain  . Bone cancer ([place])    T 1T2  . Cancer [facility]) 2011   colon cancer  . Colon cancer [facility])    Colon resection and Chemotherapy  . Coronary atherosclerosis of native coronary artery   . Diabetes mellitus without complication ([place])    takes Meformin and Levemir daily  . DVT (deep venous thrombosis) (HCC)    Left arm  . Dyspnea    lying,sitting,exertion  . Heart attack   . Hypothyroidism    takes Synthroid daily  . Myocardial infarction 2007  . Nocturia   . Other and unspecified hyperlipidemia    takes Simvastatin daily  . Prostate cancer ([place]) 12/13/12   ,Radiation  . Seizures ([place])    hx of-had one over 20 yrs ago  . Tingling    hands  . Unspecified essential hypertension    takes Diovan and Metoprolol daily  . Weakness    in legs    Medications:  See med rec  Assessment:  60 y.o. male, with H/O Stage IV (T2b, N3, M1 B) non-small cell lung cancer presented with large right upper lobe lung mass in addition to bilateral hilar lymphadenopathy as well as bilateral pleural effusion and complaining of chest pain. Continue coumadin for h/o left upper extremity DVT. INR is subtherapeutic at 1.24. Home dose is '5mg'$  daily.  Goal of Therapy:  INR 2-3 Monitor platelets by anticoagulation protocol: Yes   Plan:  Coumadin 7.'5mg'$  today x 1 Daily  PT-INR Monitor for S/S of bleeding  Isac Sarna, BS [place], BCPS Clinical Pharmacist Pager [phone] 03/13/2016,1:08 PM

## 2016-03-13 NOTE — ED Notes (Signed)
cbg in route 129

## 2016-03-13 NOTE — Progress Notes (Signed)
Pt has bilateral lower extremity wounds, wounds are wrapped at home by [facility] RN, and pt refused for RN to unwrap legs. Pt denies wanted Pleurx drained.  Celestia Khat, RN
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
## 2016-03-13 NOTE — Procedures (Signed)
PreOperative Dx: Cirrhosis, ascites Postoperative Dx: Cirrhosis, ascites Procedure:   US guided paracentesis Radiologist:  Thornton Papas Anesthesia:  10 ml of1% lidocaine Specimen:  1.6 L of milky/chylous ascitic fluid EBL:   < 1 ml Complications: None

## 2016-03-13 NOTE — ED Notes (Signed)
Floor unable to take report at this time.
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
## 2016-03-13 NOTE — ED Notes (Signed)
edp aware of pain. Pt states he usually takes pain meds for his leg.

## 2016-03-13 NOTE — Procedures (Signed)
PreOperative Dx: LEFT pleural effusion Postoperative Dx: LEFT pleural effusion Procedure:   US guided LEFT thoracentesis Radiologist:  Thornton Papas Anesthesia:  10 ml of 1% lidocaine Specimen:  2.0 L of milky slightly peach colored colored fluid EBL:   < 1 ml Complications: None

## 2016-03-13 NOTE — Progress Notes (Signed)
Thoracentesis complete no signs of distress. 2L white colored pleural fluid removed.

## 2016-03-14 LAB — BASIC METABOLIC PANEL
Anion gap: 8 (ref 5–15)
BUN: 19 mg/dL (ref 6–20)
CALCIUM: 7.9 mg/dL — AB (ref 8.9–10.3)
CO2: 28 mmol/L (ref 22–32)
CREATININE: 0.73 mg/dL (ref 0.61–1.24)
Chloride: 105 mmol/L (ref 101–111)
GFR calc non Af Amer: >60 mL/min (ref >60)
GLUCOSE: 86 mg/dL (ref 65–99)
Potassium: 3.7 mmol/L (ref 3.5–5.1)
Sodium: 141 mmol/L (ref 135–145)

## 2016-03-14 LAB — CBC
HCT: 27.8 % — ABNORMAL LOW (ref 39.0–52.0)
Hemoglobin: 8.6 g/dL — ABNORMAL LOW (ref 13.0–17.0)
MCH: 25.7 pg — AB (ref 26.0–34.0)
MCHC: 30.9 g/dL (ref 30.0–36.0)
MCV: 83.2 fL (ref 78.0–100.0)
PLATELETS: 225 10*3/uL (ref 150–400)
RBC: 3.34 MIL/uL — ABNORMAL LOW (ref 4.22–5.81)
RDW: 21.5 % — ABNORMAL HIGH (ref 11.5–15.5)
WBC: 13.7 10*3/uL — ABNORMAL HIGH (ref 4.0–10.5)

## 2016-03-14 LAB — GLUCOSE, CAPILLARY
Glucose-Capillary: 73 mg/dL (ref 65–99)
Glucose-Capillary: 92 mg/dL (ref 65–99)

## 2016-03-14 LAB — PROTIME-INR
INR: 1.41
PROTHROMBIN TIME: 17.4 s — AB (ref 11.4–15.2)

## 2016-03-14 MED ORDER — WARFARIN SODIUM 5 MG PO TABS: 7.5000 mg | ORAL_TABLET | Freq: Once | ORAL | Status: DC

## 2016-03-14 MED ORDER — PRO-STAT SUGAR FREE PO LIQD: 30.0000 mL | Freq: Two times a day (BID) | ORAL | 0 refills | Status: AC

## 2016-03-14 NOTE — Discharge Summary (Signed)
Cameron Chandler LKJ:[id number] DOB: [birth year] DOA: 03/13/2016  PCP: [name], MD  Admit date: 03/13/2016  Discharge date: 03/14/2016  Admitted From: Home  Disposition:  Home   Recommendations for Outpatient Follow-up:   Follow up with PCP in 1-2 weeks  PCP Please obtain BMP/CBC, 2 view CXR in 1week,  (see Discharge instructions)   PCP Please follow up on the following pending results: Check a two-view chest x-ray within a week may require left-sided Pleurx catheter placement as well   Home Health: None  Equipment/Devices: None  Consultations: IR Discharge Condition: Guarded   CODE STATUS: DNR   Diet Recommendation: Diet heart healthy/carb modified Fluid restriction: 1500 mL/day   Chief Complaint  Patient presents with  . Chest Pain     Brief history of present illness from the day of admission and additional interim summary    Cameron Chandler  is a 60 y.o. 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  Patient with above history who was to be seen by home hospice team today for terminal lung cancer, comes to the ER with few day history of gradually progressive shortness of breath, in the ER he was found to have acute on chronic hypoxic respiratory failure due to large left-sided  malignant pleural effusion and large amount of ascites. I was called to admit the patient. He denies any headache, no chest pain, no fever or chills, no productive cough, denies any abdominal pain, no blood in stool or urine. No focal weakness. He wants to get medically treated, confirms DO NOT RESUSCITATE and have declines wants to be full comfort care.    Hospital issues addressed     1. Acute on chronic hypoxic respiratory failure due to combination of large left-sided malignant pleural effusion and large ascites.  He underwent therapeutic left-sided ultrasound-guided thoracentesis along with ultrasound-guided paracentesis, 2 L of fluid was removed from each cavity, he also received IV albumin with it, he is much better and almost back to his baseline eager to be discharged home with hospice. He already has hospice scheduled and was supposed to be seen for the first time yesterday. This will be continued upon discharge. I will request him to follow with his PCP for repeat 2 view chest x-ray in a week. If left-sided pleural effusion is getting worse he may benefit from a Pleurx catheter placement on the left side. He already has right-sided progress catheter placed. This catheter will be drained prior to discharge as well.  Note patient is status hospice at home. Goal of care is comfort only.  2. Malignant stage IV right-sided non-small cell lung cancer and bilateral malignant pleural effusion, metastases known to T1-T2 spine and lymph nodes. He has been followed by Dr. Julien Nordmann, he was due to see hospice team today, he is DO NOT RESUSCITATE goal of care is comfort only. He has refused placement to residential hospice today and says he would prefer to go back to his daughter's house with home hospice.  3. DM type II. Continue home regimen.  4. Hypothyroidism. Continue home dose Synthroid.  5. HX of left upper extremity DVT. On Coumadin . PCP to monitor INR.  Lab Results  Component  Value Date   INR 1.24 03/13/2016   INR 1.57 02/25/2016   INR 1.44 02/24/2016     6. Dyslipidemia. On statin, these medications can be reconsidered by PCP if he is truly hospice now.  7. CAD. Only supportive care continue aspirin, tinea beta blocker if blood pressure allows and as needed sublingual nitroglycerin.  8. Severe protein calorie malnutrition with anasarca. Added protein supplementation, diuretics to continue as tolerated by blood pressure and renal function.    Discharge diagnosis     Principal Problem:   Acute respiratory failure with hypoxia (HCC) Active Problems:   Malignant neoplasm of colon (HCC)   Essential hypertension   CAD, NATIVE VESSEL   Malignant neoplasm of prostate (HCC)   Diabetes mellitus without complication (HCC)   Cigarette smoker   Cervical myelopathy (HCC)   Hypothyroidism, acquired   Adenocarcinoma of right lung, stage 4 (HCC)   Hypokalemia   Protein-calorie malnutrition, severe   Pleural effusion, left   SOB (shortness of breath)    Discharge instructions    Discharge Instructions    Discharge instructions    Complete by:  As directed    Follow with Primary MD [name], MD in 7 days   Get CBC, CMP, INR,  2 view Chest X ray checked  by Primary MD or SNF MD in 5-7 days ( we routinely change or add medications that can affect your baseline labs and fluid status, therefore we recommend that you get the mentioned basic workup next visit with your PCP, your PCP may decide not to get them or add new tests based on their clinical decision)   Activity: As tolerated with Full fall precautions use walker/cane & assistance as needed   Disposition Home    Diet:   Diet heart healthy/carb modified Fluid restriction: 1500 mL/day  For Heart failure patients - Check your Weight same time everyday, if you gain over 2 pounds, or you develop in leg swelling, experience more shortness of breath or chest pain, call your Primary MD  immediately. Follow Cardiac Low Salt Diet and 1.5 lit/day fluid restriction.   On your next visit with your primary care physician please Get Medicines reviewed and adjusted.   Please request your Prim.MD to go over all Hospital Tests and Procedure/Radiological results at the follow up, please get all Hospital records sent to your Prim MD by signing hospital release before you go home.   If you experience worsening of your admission symptoms, develop shortness of breath, life threatening emergency, suicidal or homicidal thoughts you must seek medical attention immediately by calling 911 or calling your MD immediately  if symptoms less severe.  You Must read complete instructions/literature along with all the possible adverse reactions/side effects for all the Medicines you take and that have been prescribed to you. Take any new Medicines after you have completely understood and accpet  all the possible adverse reactions/side effects.   Do not drive, operate heavy machinery, perform activities at heights, swimming or participation in water activities or provide baby sitting services if your were admitted for syncope or siezures until you have seen by Primary MD or a Neurologist and advised to do so again.  Do not drive when taking Pain medications.    Do not take more than prescribed Pain, Sleep and Anxiety Medications  Special Instructions: If you have smoked or chewed Tobacco  in the last 2 yrs please stop smoking, stop any regular Alcohol  and or any Recreational drug use.  Wear Seat belts while driving.   Please note  You were cared for by a hospitalist during your hospital stay. If you have any questions about your discharge medications or the care you received while you were in the hospital after you are discharged, you can call the unit and asked to speak with the hospitalist on call if the hospitalist that took care of you is not available. Once you are discharged, your primary care  physician will handle any further medical issues. Please note that NO REFILLS for any discharge medications will be authorized once you are discharged, as it is imperative that you return to your primary care physician (or establish a relationship with a primary care physician if you do not have one) for your aftercare needs so that they can reassess your need for medications and monitor your lab values.   Increase activity slowly    Complete by:  As directed       Discharge Medications   Allergies as of 03/14/2016      Reactions   No Known Allergies       Medication List    TAKE these medications   aspirin EC 81 MG tablet Take 81 mg by mouth daily.   dexamethasone 4 MG tablet Commonly known as:  DECADRON 4 mg by mouth twice a day the day before, day of and day after the chemotherapy every 3 weeks What changed:  how much to take  how to take this  when to take this  additional instructions   feeding supplement (ENSURE ENLIVE) Liqd Take 237 mLs by mouth 2 (two) times daily between meals.   feeding supplement (PRO-STAT SUGAR FREE 64) Liqd Take 30 mLs by mouth 2 (two) times daily.   Fenofibric Acid 105 MG Tabs Take 1 tablet by mouth daily.   folic acid 1 MG tablet Commonly known as:  FOLVITE Take 1 tablet (1 mg total) by mouth daily.   furosemide 40 MG tablet Commonly known as:  LASIX Take 1 tablet (40 mg total) by mouth daily.   gabapentin 100 MG capsule Commonly known as:  NEURONTIN Take 3 capsules (300 mg total) by mouth 3 (three) times daily.   hydrochlorothiazide 25 MG tablet Commonly known as:  HYDRODIURIL Take 25 mg by mouth daily.   HYDROcodone-acetaminophen 7.5-325 MG tablet Commonly known as:  NORCO Take 1 tablet by mouth 3 (three) times daily.   insulin detemir 100 unit/ml Soln Commonly known as:  LEVEMIR Inject 30 Units into the skin at bedtime.   levothyroxine 25 MCG tablet Commonly known as:  SYNTHROID, LEVOTHROID Take 1 tablet (25 mcg  total) by mouth daily before breakfast.   lidocaine-prilocaine cream Commonly known as:  EMLA Apply 1 application topically as needed. What changed:  reasons to take this   meloxicam 7.5 MG tablet Commonly known as:  MOBIC Take 7.5 mg by mouth  daily.   metFORMIN 500 MG (MOD) 24 hr tablet Commonly known as:  GLUMETZA Take 1,000 mg by mouth 2 (two) times daily with a meal.   metoprolol tartrate 25 MG tablet Commonly known as:  LOPRESSOR Take 12.5 mg by mouth 2 (two) times daily.   nitroGLYCERIN 0.4 MG SL tablet Commonly known as:  NITROSTAT Place 0.4 mg under the tongue every 5 (five) minutes as needed for chest pain.   potassium chloride SA 20 MEQ tablet Commonly known as:  K-DUR,KLOR-CON Take 2 tablets (40 mEq total) by mouth daily.   prochlorperazine 10 MG tablet Commonly known as:  COMPAZINE Take 1 tablet (10 mg total) by mouth every 6 (six) hours as needed for nausea or vomiting.   simvastatin 80 MG tablet Commonly known as:  ZOCOR TAKE 1 TABLET BY MOUTH DAILY AT 6 P.M.   SONAFINE Apply 1 application topically daily.   valsartan 160 MG tablet Commonly known as:  DIOVAN Take 160 mg by mouth daily.   warfarin 5 MG tablet Commonly known as:  COUMADIN Take 5 mg by mouth daily.       Follow-up Information    [name], MD. Schedule an appointment as soon as possible for a visit in 1 week(s).   Specialty:  Internal Medicine Contact information: [address] [place] [address] [id number] [phone]        Melrose Nakayama, MD. Schedule an appointment as soon as possible for a visit in 1 week(s).   Specialty:  Cardiothoracic Surgery Why:  May require left-sided Pleurx catheter Contact information: [address] [place] [address] [phone]           Major procedures and Radiology Reports - PLEASE review detailed and final reports thoroughly  -        Dg Chest 1 View  Result Date: 03/13/2016 CLINICAL DATA:  Post  thoracentesis EXAM: CHEST 1 VIEW COMPARISON:  03/13/2016 FINDINGS: Decreasing left effusion following thoracentesis. No pneumothorax. Moderate bilateral effusions remain with bibasilar atelectasis. Mild cardiomegaly. Left Port-A-Cath is in place with the tip in the SVC. IMPRESSION: Decreasing left effusion following thoracentesis.  No pneumothorax. Moderate bilateral effusions with bibasilar atelectasis. Electronically Signed   By: Rolm Baptise M.D.   On: 03/13/2016 14:57   Dg Chest 2 View  Result Date: 02/24/2016 CLINICAL DATA:  Lung malignancy with known malignant right pleural effusion status post pleural catheter placement. Also chronic left pleural effusion. History of CHF as well. EXAM: CHEST  2 VIEW COMPARISON:  Portable chest x-ray of February 21, 2016 FINDINGS: The volume of pleural fluid on the left appears to have decreased. The pleural catheter tip overlies the posterior medial aspect of the fifth and sixth rib interspace. On the left there is a moderate-sized pleural effusion that may have increased slightly since the previous study. The heart is top-normal in size. The pulmonary vascularity is not engorged. The Port-A-Cath appliance tip projects over the midportion of the SVC. There is calcification in the wall of the aortic arch. There is multilevel degenerative disc space narrowing with endplate osteophyte formation in the mid and lower thoracic spine. IMPRESSION: Mild interval decrease in the volume of the right pleural effusion which remains small to moderate in size. Stable appearance of the small caliber right chest tube. Slight interval increase in the volume of the small or moderate size left pleural effusion. Top-normal cardiac size without pulmonary vascular congestion. Thoracic aortic atherosclerosis. Multilevel degenerative disc disease of the mid  and lower thoracic spine. Electronically Signed   By: David  Martinique M.D.   On: 02/24/2016 13:48   Dg Chest 2 View  Result Date:  02/18/2016 CLINICAL DATA:  Lung cancer. EXAM: CHEST  2 VIEW COMPARISON:  02/12/2016. FINDINGS: PowerPort catheter noted with lead tip over the superior vena cava. Cardiomegaly with mild pulmonary venous congestion and bilateral interstitial prominence with bilateral pleural effusions consistent congestive heart failure. PleurX catheter noted in stable position over the right chest. Pleural effusions size is are stable. Low lung volumes with basilar atelectasis. No pneumothorax. Cervical spine fusion. IMPRESSION: 1. PowerPort catheter and and PleurX catheter in stable position. 2. Changes consistent mild congestive heart failure with mild pulmonary interstitial edema and and bilateral pleural effusions. Pleural effusions size is are stable from prior exam. 3. Low lung volumes with basilar atelectasis. Electronically Signed   By: Marcello Moores  Register   On: 02/18/2016 11:02   US Paracentesis  Result Date: 03/13/2016 INDICATION: Stage IV adenocarcinoma RIGHT lung, shortness of breath, ascites, LEFT pleural effusion EXAM: ULTRASOUND GUIDED THERAPEUTIC PARACENTESIS MEDICATIONS: None. COMPLICATIONS: None immediate. PROCEDURE: Procedure, benefits, and risks of procedure were discussed with patient. Written informed consent for procedure was obtained. Time out protocol followed. Adequate collection of ascites localized by ultrasound in LEFT lower quadrant. Skin prepped and draped in usual sterile fashion. Skin and soft tissues anesthetized with 10 mL of 1% lidocaine. 5 [place] Yueh catheter placed into peritoneal cavity. 1.6 L of milky cream colored fluid aspirated by vacuum bottle suction. Procedure tolerated well by patient without immediate complication. FINDINGS: As above IMPRESSION: Successful ultrasound-guided paracentesis yielding 1.6 liters of chylous peritoneal fluid. Electronically Signed   By: Lavonia Dana M.D.   On: 03/13/2016 15:11   US Paracentesis  Result Date: 02/24/2016 INDICATION: RIGHT pleural  effusion, abdominal distension, ascites EXAM: ULTRASOUND GUIDED DIAGNOSTIC AND THERAPEUTIC PARACENTESIS MEDICATIONS: None. COMPLICATIONS: None immediate. PROCEDURE: Procedure, benefits, and risks of procedure were discussed with patient. Written informed consent for procedure was obtained. Time out protocol followed. Adequate collection of ascites localized by ultrasound in LEFT lower quadrant. Skin prepped and draped in usual sterile fashion. Skin and soft tissues anesthetized with 10 mL of 1% lidocaine. 5 [place] Yueh catheter placed into peritoneal cavity. 3.4 L of milky white fluid aspirated by vacuum bottle suction. Procedure tolerated well by patient without immediate complication. FINDINGS: A total of approximately 3.4 L of milky white ascitic fluid was removed. Samples were sent to the laboratory as requested by the clinical team. IMPRESSION: Successful ultrasound-guided paracentesis yielding 3.4 liters of peritoneal fluid. Electronically Signed   By: Lavonia Dana M.D.   On: 02/24/2016 13:24   Dg Chest Portable 1 View  Result Date: 03/13/2016 CLINICAL DATA:  Lung cancer, chest pain EXAM: PORTABLE CHEST 1 VIEW COMPARISON:  02/24/2016 FINDINGS: Moderate left pleural effusion which has increased compared with 02/24/2016. Trace right pleural effusion. Right basilar chest tube. No pneumothorax. Bilateral diffuse interstitial thickening. Stable cardiomediastinal silhouette. Left-sided Port-A-Cath with the tip projecting over the SVC. No acute osseous abnormality. IMPRESSION: 1. Moderate size left pleural effusion which has increased in size compared with 02/24/2016. 2. Bilateral diffuse interstitial thickening concerning for mild interstitial edema. Electronically Signed   By: Kathreen Devoid   On: 03/13/2016 09:39   Dg Chest Port 1 View  Result Date: 02/21/2016 CLINICAL DATA:  LEFT pleural effusion post thoracentesis, history colon cancer, prostate cancer, RIGHT lung cancer EXAM: PORTABLE CHEST 1 VIEW  COMPARISON:  Portable exam 1718 hours compared to 1108 hours  FINDINGS: Bibasilar effusions, decreased on LEFT since previous exam. No pneumothorax following thoracentesis. Stable heart size and mediastinal contours. LEFT subclavian Port-A-Cath unchanged. IMPRESSION: No pneumothorax following LEFT thoracentesis. Electronically Signed   By: Lavonia Dana M.D.   On: 02/21/2016 17:42   Dg Chest Port 1 View  Result Date: 02/21/2016 CLINICAL DATA:  Shortness of breath . EXAM: PORTABLE CHEST 1 VIEW COMPARISON:  02/18/2016. FINDINGS: PowerPort catheter noted with tip over superior vena cava. Cardiomegaly with pulmonary vascular prominence and bilateral pulmonary infiltrates with bilateral pleural effusions noted. These findings are consistent congestive heart failure. Findings have progressed from prior exam. No pneumothorax. IMPRESSION: Congestive heart failure with pulmonary edema and bilateral pleural effusions. Findings have progressed from prior exam. Electronically Signed   By: Marcello Moores  Register   On: 02/21/2016 11:21   Dg Abd 2 Views  Result Date: 02/24/2016 CLINICAL DATA:  Status post paracentesis for ascites. EXAM: ABDOMEN - 2 VIEW COMPARISON:  CT images from a PET-CT scan dated November 20, 2015 FINDINGS: There is gaseous distention of portions of the ascending, transverse, and proximal descending colon. There is a moderate amount of stool in the right colon. There is stool in the rectum. There is no significant small bowel distention. There phleboliths in the pelvis. There are degenerative changes of the lower lumbar spine. Bilateral smaller moderate-sized pleural effusions. Left lower lobe atelectasis or infiltrate. IMPRESSION: Findings suggestive of colonic ileus. There is no objective evidence of obstruction. Electronically Signed   By: David  Martinique M.D.   On: 02/24/2016 13:51   US Thoracentesis Asp Pleural Space W/img Guide  Result Date: 03/13/2016 INDICATION: Adenocarcinoma of the RIGHT lung, LEFT  pleural effusion, shortness of breath EXAM: ULTRASOUND GUIDED THERAPEUTIC LEFT THORACENTESIS MEDICATIONS: None. COMPLICATIONS: None immediate. PROCEDURE: Procedure, benefits, and risks of procedure were discussed with patient. Written informed consent for procedure was obtained. Time out protocol followed. Pleural effusion localized by ultrasound at the posterior LEFT hemithorax. Skin prepped and draped in usual sterile fashion. Skin and soft tissues anesthetized with 10 mL of 1% lidocaine. 8 French thoracentesis catheter placed into the LEFT pleural space. 2.0 L of light pink creamy colored fluid aspirated by syringe pump. Procedure tolerated well by patient without immediate complication. FINDINGS: As above IMPRESSION: Successful ultrasound guided LEFT thoracentesis yielding 2 L of pleural fluid. Patient reports symptomatic improvement shortness of breath following the procedure. Electronically Signed   By: Lavonia Dana M.D.   On: 03/13/2016 15:14   US Thoracentesis Asp Pleural Space W/img Guide  Result Date: 02/21/2016 INDICATION: LEFT pleural effusion, shortness of breath, history of colon cancer, prostate cancer, RIGHT lung cancer, diabetes mellitus, coronary artery disease post MI and PTCA EXAM: ULTRASOUND GUIDED DIAGNOSTIC AND THERAPEUTIC LEFT THORACENTESIS MEDICATIONS: None. COMPLICATIONS: None immediate. PROCEDURE: Procedure, benefits, and risks of procedure were discussed with patient. Written informed consent for procedure was obtained. Time out protocol followed. Pleural effusion localized by ultrasound at the posterior LEFT hemithorax. Skin prepped and draped in usual sterile fashion. Skin and soft tissues anesthetized with 10 mL of 1% lidocaine. 8 French thoracentesis catheter placed into the LEFT pleural space. 1.52 L of cloudy red fluid aspirated by syringe pump. Procedure tolerated well by patient without immediate complication. FINDINGS: A total of approximately 1.52 L of LEFT pleural fluid was  removed. Samples were sent to the laboratory as requested by the clinical team. IMPRESSION: Successful ultrasound guided LEFT thoracentesis yielding 1.52 L of pleural fluid. Electronically Signed   By: Crist Infante.D.  On: 02/21/2016 17:23    Micro Results     No results found for this or any previous visit (from the past 240 hour(s)).  Today   Subjective    Cameron Chandler today has no headache,no chest abdominal pain,no new weakness tingling or numbness, feels much better wants to go home today.     Objective   Blood pressure 101/74, pulse 88, temperature 97.3 F (36.3 C), temperature source Oral, resp. rate 20, height '5\' 6"'$  (1.676 m), weight 76.7 kg (169 lb 1.6 oz), SpO2 100 %.   Intake/Output Summary (Last 24 hours) at 03/14/16 0943 Last data filed at 03/14/16 0548  Gross per 24 hour  Intake              150 ml  Output             1825 ml  Net            -1675 ml    Exam Awake Alert, Oriented x 3, No new F.N deficits, Normal affect [place].AT,PERRAL Supple Neck,No JVD, No cervical lymphadenopathy appriciated.  Symmetrical Chest wall movement, Good air movement bilaterally, reduced bi basilar breath sounds RRR,No Gallops,Rubs or new Murmurs, No Parasternal Heave +ve B.Sounds, Abd Soft, Mild ascites, Non tender, No organomegaly appriciated, No rebound -guarding or rigidity. No Cyanosis, Clubbing, No new Rash or bruise,  has diffuse anasarca, right chest wall Pleurx catheter, left chest wall Port-A-Cath   Data Review   CBC w Diff: Lab Results  Component Value Date   WBC 13.7 (H) 03/14/2016   HGB 8.6 (L) 03/14/2016   HGB 8.8 (L) 03/05/2016   HCT 27.8 (L) 03/14/2016   HCT 28.0 (L) 03/05/2016   PLT 225 03/14/2016   PLT 426 (H) 03/05/2016   LYMPHOPCT 6 03/13/2016   LYMPHOPCT 5.3 (L) 03/05/2016   BANDSPCT 17 02/06/2016   MONOPCT 9 03/13/2016   MONOPCT 8.6 03/05/2016   EOSPCT 1 03/13/2016   EOSPCT 0.5 03/05/2016   BASOPCT 0 03/13/2016   BASOPCT 0.1 03/05/2016     CMP: Lab Results  Component Value Date   NA 141 03/14/2016   NA 145 03/05/2016   K 3.7 03/14/2016   K 2.8 Repeated and Verified (LL) 03/05/2016   CL 105 03/14/2016   CO2 28 03/14/2016   CO2 26 03/05/2016   BUN 19 03/14/2016   BUN 12.6 03/05/2016   CREATININE 0.73 03/14/2016   CREATININE 0.8 03/05/2016   PROT 6.3 (L) 03/05/2016   ALBUMIN <2.0 (L) 03/05/2016   BILITOT <0.22 03/05/2016   ALKPHOS 93 03/05/2016   AST 29 03/05/2016   ALT 23 03/05/2016  .   Total Time in preparing paper work, data evaluation and todays exam - 35 minutes  Thurnell Lose M.D on 03/14/2016 at 9:43 AM  [facility]   Office  [phone]

## 2016-03-14 NOTE — Progress Notes (Signed)
Client being discharged to home with family. Reviewed all discharge instructions, including follow up appointments, medications, and signs/symptoms to report. Instructed client on next dose due of all medications, reviewed new medication prostat and prescription given. Client discharged to home with daughter. Daughter did bring client's home oxygen tank for use during transport to home, client escorted to hospital exit via wheelchair.

## 2016-03-14 NOTE — Discharge Instructions (Signed)
Follow with Primary MD [name], MD in 7 days   Get CBC, CMP, INR, 2 view Chest X ray checked  by Primary MD or SNF MD in 5-7 days ( we routinely change or add medications that can affect your baseline labs and fluid status, therefore we recommend that you get the mentioned basic workup next visit with your PCP, your PCP may decide not to get them or add new tests based on their clinical decision)   Activity: As tolerated with Full fall precautions use walker/cane & assistance as needed   Disposition Home    Diet:   Diet heart healthy/carb modified Fluid restriction: 1500 mL/day  For Heart failure patients - Check your Weight same time everyday, if you gain over 2 pounds, or you develop in leg swelling, experience more shortness of breath or chest pain, call your Primary MD immediately. Follow Cardiac Low Salt Diet and 1.5 lit/day fluid restriction.   On your next visit with your primary care physician please Get Medicines reviewed and adjusted.   Please request your Prim.MD to go over all Hospital Tests and Procedure/Radiological results at the follow up, please get all Hospital records sent to your Prim MD by signing hospital release before you go home.   If you experience worsening of your admission symptoms, develop shortness of breath, life threatening emergency, suicidal or homicidal thoughts you must seek medical attention immediately by calling 911 or calling your MD immediately  if symptoms less severe.  You Must read complete instructions/literature along with all the possible adverse reactions/side effects for all the Medicines you take and that have been prescribed to you. Take any new Medicines after you have completely understood and accpet all the possible adverse reactions/side effects.   Do not drive, operate heavy machinery, perform activities at heights, swimming or participation in water activities or provide baby sitting services if your were admitted for syncope  or siezures until you have seen by Primary MD or a Neurologist and advised to do so again.  Do not drive when taking Pain medications.    Do not take more than prescribed Pain, Sleep and Anxiety Medications  Special Instructions: If you have smoked or chewed Tobacco  in the last 2 yrs please stop smoking, stop any regular Alcohol  and or any Recreational drug use.  Wear Seat belts while driving.   Please note  You were cared for by a hospitalist during your hospital stay. If you have any questions about your discharge medications or the care you received while you were in the hospital after you are discharged, you can call the unit and asked to speak with the hospitalist on call if the hospitalist that took care of you is not available. Once you are discharged, your primary care physician will handle any further medical issues. Please note that NO REFILLS for any discharge medications will be authorized once you are discharged, as it is imperative that you return to your primary care physician (or establish a relationship with a primary care physician if you do not have one) for your aftercare needs so that they can reassess your need for medications and monitor your lab values.

## 2016-03-14 NOTE — Progress Notes (Signed)
ANTICOAGULATION CONSULT NOTE - Initial Consult  Pharmacy Consult for Coumadin Indication: DVT  Allergies  Allergen Reactions  . No Known Allergies     Vital Signs: Temp: 97.3 F (36.3 C) (12/16 0546) Temp Source: Oral (12/16 0546) BP: 101/74 (12/16 0546) Pulse Rate: 88 (12/16 0546)  Labs:  Recent Labs  03/13/16 0952 03/14/16 0610  HGB 9.2* 8.6*  HCT 30.0* 27.8*  PLT 254 225  LABPROT 15.7* 17.4*  INR 1.24 1.41  CREATININE 0.70 0.73  TROPONINI <0.03  --     Estimated Creatinine Clearance: 93.4 mL/min (by C-G formula based on SCr of 0.73 mg/dL).   Medical History: Past Medical History:  Diagnosis Date  . Adenocarcinoma of right lung, stage 4 ([place]) 12/03/2015  . Arthritis    back, joint pain  . Bone cancer ([place])    T 1T2  . Cancer [facility]) 2011   colon cancer  . Colon cancer [facility])    Colon resection and Chemotherapy  . Coronary atherosclerosis of native coronary artery   . Diabetes mellitus without complication ([place])    takes Meformin and Levemir daily  . DVT (deep venous thrombosis) ([facility])    Left arm  . Dyspnea    lying,sitting,exertion  . Heart attack   . Hypothyroidism    takes Synthroid daily  . Myocardial infarction 2007  . Nocturia   . Other and unspecified hyperlipidemia    takes Simvastatin daily  . Prostate cancer ([place]) 12/13/12   ,Radiation  . Seizures ([place])    hx of-had one over 20 yrs ago  . Tingling    hands  . Unspecified essential hypertension    takes Diovan and Metoprolol daily  . Weakness    in legs    Medications:  See med rec  Assessment:  60 y.o. male, with H/O Stage IV (T2b, N3, M1 B) non-small cell lung cancer presented with large right upper lobe lung mass in addition to bilateral hilar lymphadenopathy as well as bilateral pleural effusion and complaining of chest pain. Continue coumadin for h/o left upper extremity DVT. INR is subtherapeutic at 1.41. Home dose is '5mg'$  daily.  Goal of Therapy:  INR 2-3 Monitor platelets by  anticoagulation protocol: Yes   Plan:  Coumadin 7.'5mg'$  today x 1 Daily PT-INR Monitor for S/S of bleeding  Isac Sarna, BS Vena Austria, BCPS Clinical Pharmacist Pager [phone] 03/14/2016,11:16 AM

## 2016-03-16 ENCOUNTER — Other Ambulatory Visit: Payer: Medicare HMO

## 2016-03-16 ENCOUNTER — Ambulatory Visit: Payer: Medicare HMO | Admitting: Nurse Practitioner

## 2016-03-16 ENCOUNTER — Telehealth: Payer: Self-pay | Admitting: Medical Oncology

## 2016-03-16 ENCOUNTER — Ambulatory Visit: Payer: Medicare HMO

## 2016-03-16 ENCOUNTER — Encounter: Payer: Medicare HMO | Admitting: Nutrition

## 2016-03-16 ENCOUNTER — Encounter: Payer: Self-pay | Admitting: Nutrition

## 2016-03-16 NOTE — Telephone Encounter (Signed)
[name] left message re "concerns" Returned call to pam but "no voice mail set up"

## 2016-03-16 NOTE — Progress Notes (Signed)
Patient recently hospitalized. Noted he desires full comfort care. No nutrition follow up warranted.

## 2016-03-17 ENCOUNTER — Telehealth: Payer: Self-pay | Admitting: Medical Oncology

## 2016-03-17 ENCOUNTER — Other Ambulatory Visit: Payer: Self-pay | Admitting: Internal Medicine

## 2016-03-17 DIAGNOSIS — C3491 Malignant neoplasm of unspecified part of right bronchus or lung: Secondary | ICD-10-CM

## 2016-03-17 NOTE — Telephone Encounter (Signed)
Cameron Chandler requests referral to [facility] for further care . She cannot transport him to [place] .

## 2016-03-17 NOTE — Telephone Encounter (Signed)
Done. Please follow up.

## 2016-03-20 ENCOUNTER — Telehealth: Payer: Self-pay | Admitting: Internal Medicine

## 2016-03-20 NOTE — Telephone Encounter (Signed)
Faxed record to [employer]

## 2016-03-24 ENCOUNTER — Other Ambulatory Visit: Payer: Medicare HMO

## 2016-03-25 ENCOUNTER — Encounter (HOSPITAL_BASED_OUTPATIENT_CLINIC_OR_DEPARTMENT_OTHER): Payer: Medicare HMO

## 2016-03-31 ENCOUNTER — Other Ambulatory Visit: Payer: Medicare HMO

## 2016-04-06 ENCOUNTER — Ambulatory Visit: Payer: Medicare HMO

## 2016-04-06 ENCOUNTER — Other Ambulatory Visit: Payer: Medicare HMO

## 2016-04-06 ENCOUNTER — Ambulatory Visit: Payer: Medicare HMO | Admitting: Internal Medicine

## 2016-04-07 ENCOUNTER — Other Ambulatory Visit: Payer: Self-pay | Admitting: Internal Medicine

## 2016-04-09 ENCOUNTER — Ambulatory Visit (HOSPITAL_COMMUNITY)
Admission: RE | Admit: 2016-04-09 | Discharge: 2016-04-09 | Disposition: A | Discharge status: Home / Self Care | Payer: Medicare HMO | Source: Ambulatory Visit | Attending: Oncology | Admitting: Oncology

## 2016-04-09 ENCOUNTER — Encounter (HOSPITAL_COMMUNITY): Payer: Self-pay | Admitting: Hematology & Oncology

## 2016-04-09 ENCOUNTER — Encounter (HOSPITAL_COMMUNITY): Payer: Medicare HMO | Attending: Hematology & Oncology | Admitting: Hematology & Oncology

## 2016-04-09 VITALS — BP 136/98 | HR 104 | Resp 20 | Wt 181.9 lb

## 2016-04-09 DIAGNOSIS — C7951 Secondary malignant neoplasm of bone: Secondary | ICD-10-CM | POA: Diagnosis not present

## 2016-04-09 DIAGNOSIS — Z85038 Personal history of other malignant neoplasm of large intestine: Secondary | ICD-10-CM | POA: Insufficient documentation

## 2016-04-09 DIAGNOSIS — R188 Other ascites: Secondary | ICD-10-CM | POA: Diagnosis not present

## 2016-04-09 DIAGNOSIS — C3491 Malignant neoplasm of unspecified part of right bronchus or lung: Secondary | ICD-10-CM

## 2016-04-09 DIAGNOSIS — Z8546 Personal history of malignant neoplasm of prostate: Secondary | ICD-10-CM | POA: Insufficient documentation

## 2016-04-09 DIAGNOSIS — R180 Malignant ascites: Secondary | ICD-10-CM

## 2016-04-09 DIAGNOSIS — A4902 Methicillin resistant Staphylococcus aureus infection, unspecified site: Secondary | ICD-10-CM

## 2016-04-09 DIAGNOSIS — C3411 Malignant neoplasm of upper lobe, right bronchus or lung: Secondary | ICD-10-CM

## 2016-04-09 DIAGNOSIS — J910 Malignant pleural effusion: Secondary | ICD-10-CM

## 2016-04-09 DIAGNOSIS — Z7189 Other specified counseling: Secondary | ICD-10-CM

## 2016-04-09 DIAGNOSIS — R609 Edema, unspecified: Secondary | ICD-10-CM
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
## 2016-04-09 NOTE — Progress Notes (Signed)
Ultrasound guided paracentesis Initial VS: T 98.6 HR 56 BP 131/105 O2 99%  End VS: HR 107 BP 143/103 O2 *recorded as 49% but was not picking up well at end of procedure, felt to be inaccurate - patient remained asymptomatic throughout procedure  Hanad Leino A. Thornton Papas, M.D.

## 2016-04-09 NOTE — Procedures (Signed)
PreOperative Dx: Ascites Postoperative Dx: Ascites Procedure:   US guided paracentesis Radiologist:  Thornton Papas Anesthesia:  10 ml of1% lidocaine Specimen:  3 L of milky white ascitic fluid EBL:   < 1 ml Complications: None

## 2016-04-09 NOTE — Patient Instructions (Signed)
[facility] Discharge Instructions  RECOMMENDATIONS MADE BY THE CONSULTANT AND ANY TEST RESULTS WILL BE SENT TO YOUR REFERRING PHYSICIAN.  We are referring you to [facility].   Yellow DNR paper given.     Thank you for choosing [place] at [facility] to provide your oncology and hematology care.  To afford each patient quality time with our provider, please arrive at least 15 minutes before your scheduled appointment time.    If you have a lab appointment with the [facility] please come in thru the  Main Entrance and check in at the main information desk  You need to re-schedule your appointment should you arrive 10 or more minutes late.  We strive to give you quality time with our providers, and arriving late affects you and other patients whose appointments are after yours.  Also, if you no show three or more times for appointments you may be dismissed from the clinic at the providers discretion.     Again, thank you for choosing [facility].  Our hope is that these requests will decrease the amount of time that you wait before being seen by our physicians.       _____________________________________________________________  Should you have questions after your visit to [facility], please contact our office at [phone] between the hours of 8:30 a.m. and 4:30 p.m.  Voicemails left after 4:30 p.m. will not be returned until the following business day.  For prescription refill requests, have your pharmacy contact our office.       Resources For Cancer Patients and their Caregivers ? American [employer] Society: Can assist with transportation, wigs, general needs, runs Look Good Feel Better.        586-681-0740 ? Cancer Care: Provides financial assistance, online support groups, medication/co-pay assistance.  1-800-813-HOPE 639-688-3772) ? [place] Assists  [place] cancer patients and their families through emotional , educational and financial support.  (682)200-2240 ? [place] DSS Where to apply for food stamps, Medicaid and utility assistance. 9317054157 ? [employer]: Transportation to medical appointments. 6366215891 ? Social Security Administration: May apply for disability if have a Stage IV cancer. 563-787-4820 615-052-2455 ? [employer], Disability and Transit Services: Assists with nutrition, care and transit needs. [place] Support Programs: '@10RELATIVEDAYS'$ @ > Cancer Support Group  2nd Tuesday of the month 1pm-2pm, Journey Room  > Creative Journey  3rd Tuesday of the month 1130am-1pm, Journey Room  > Look Good Feel Better  1st Wednesday of the month 10am-12 noon, Journey Room (Call [place] to register [phone])

## 2016-04-10 ENCOUNTER — Encounter (HOSPITAL_COMMUNITY): Payer: Self-pay | Admitting: Lab

## 2016-04-10 NOTE — Progress Notes (Unsigned)
Referral sent to Hospice to [facility].  Records faxed on 1/12

## 2016-04-13 ENCOUNTER — Other Ambulatory Visit: Payer: Medicare HMO

## 2016-04-20 ENCOUNTER — Other Ambulatory Visit: Payer: Medicare HMO

## 2016-04-26 ENCOUNTER — Encounter (HOSPITAL_COMMUNITY): Payer: Self-pay | Admitting: Hematology & Oncology

## 2016-04-27 ENCOUNTER — Ambulatory Visit: Payer: Medicare HMO

## 2016-04-27 ENCOUNTER — Ambulatory Visit: Payer: Medicare HMO | Admitting: Internal Medicine

## 2016-04-27 ENCOUNTER — Other Ambulatory Visit: Payer: Medicare HMO

## 2016-05-07 ENCOUNTER — Encounter: Payer: Self-pay | Admitting: Vascular Surgery

## 2016-05-13 ENCOUNTER — Ambulatory Visit (HOSPITAL_COMMUNITY): Payer: Medicare HMO

## 2016-05-13 ENCOUNTER — Encounter: Payer: Medicare HMO | Admitting: Vascular Surgery

## 2016-05-28 DEATH — deceased

## 2016-07-10 ENCOUNTER — Other Ambulatory Visit: Payer: Self-pay | Admitting: Nurse Practitioner

## 2018-01-31 IMAGING — CR DG CHEST 2V
2 series · 2 of 2 positions shown · non-contrast
Comparison: 12/10/2015

CLINICAL DATA: Recurrent right pleural effusion. Scheduled for
placement of pleural drainage catheter.

EXAM:
CHEST  2 VIEW

[w chest pa]
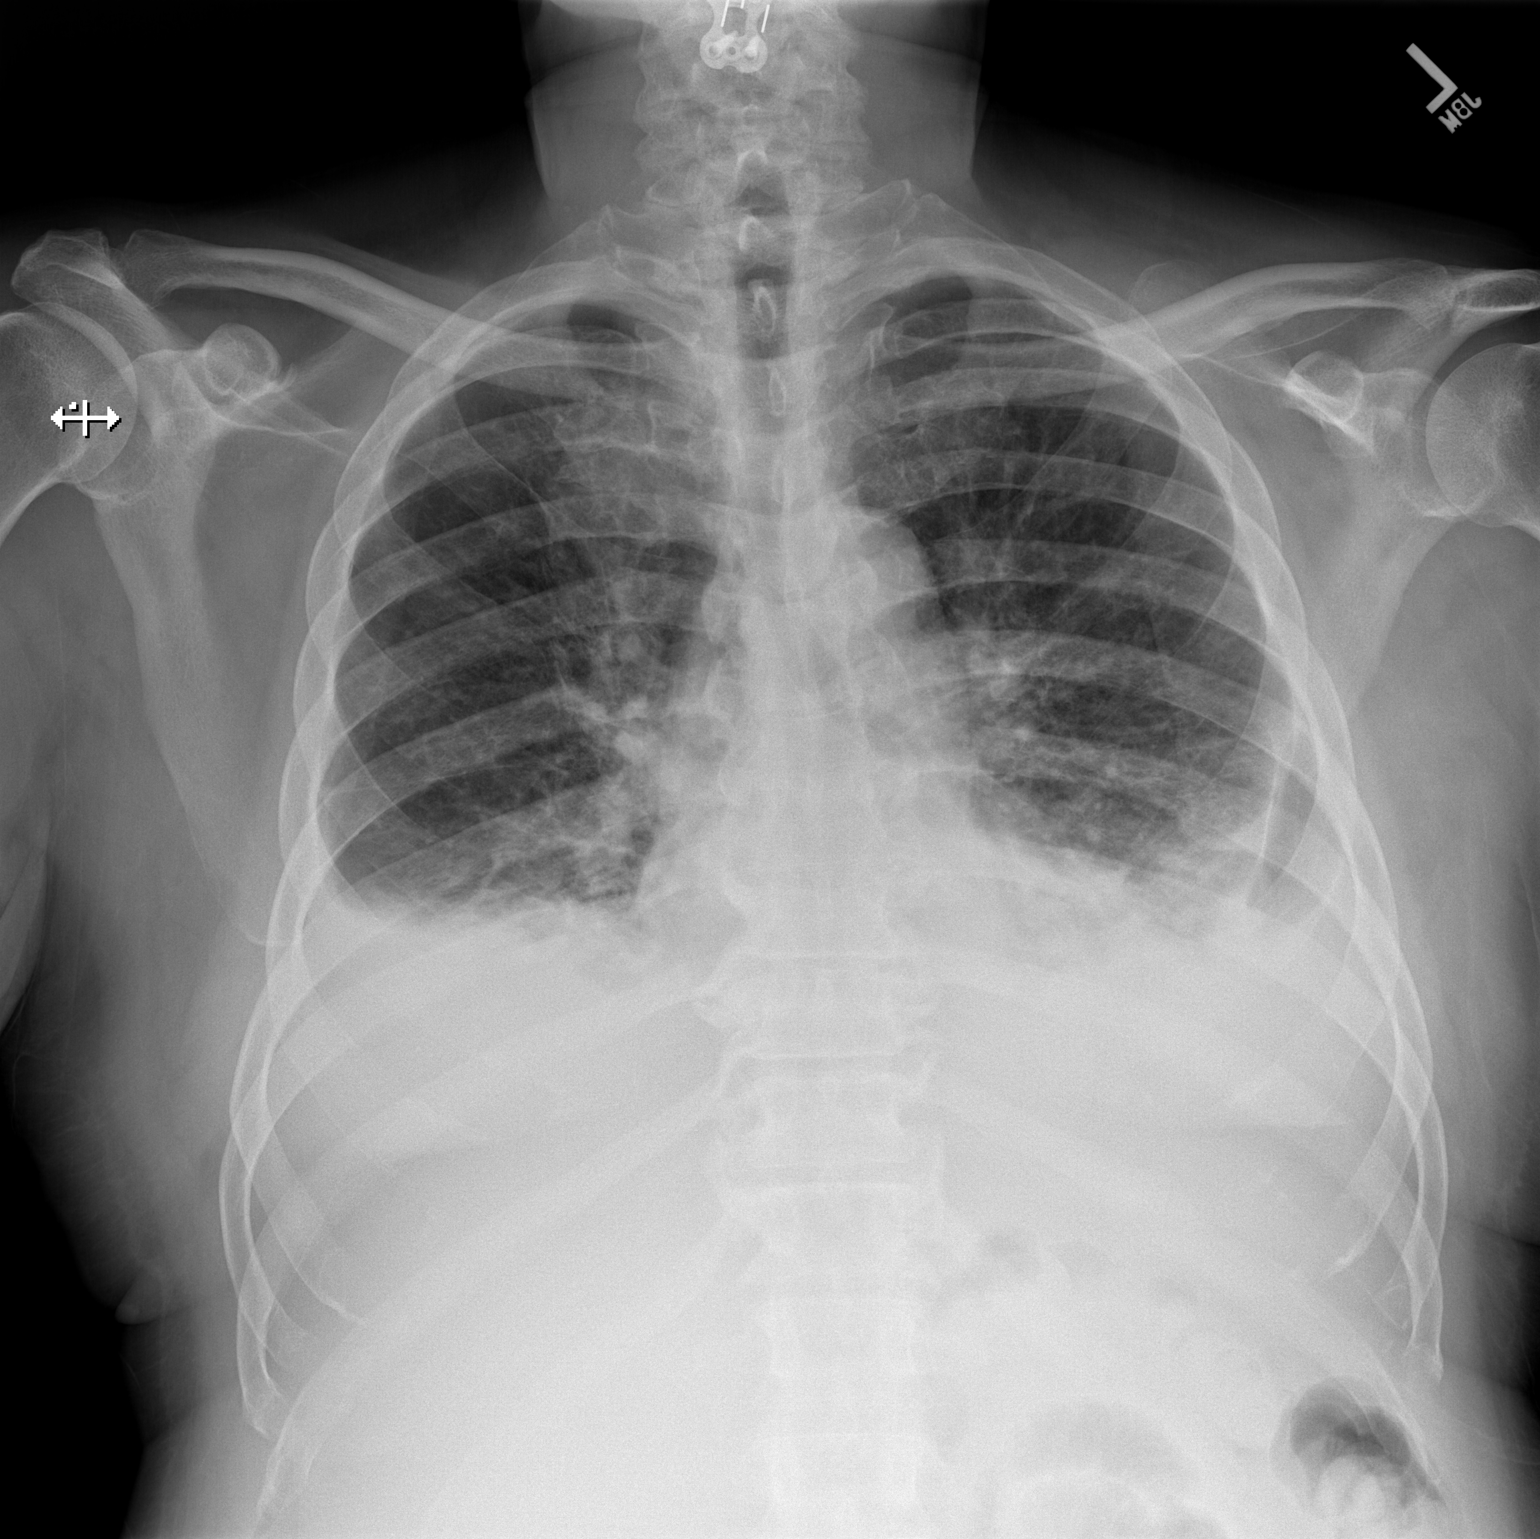

[w chest lat]
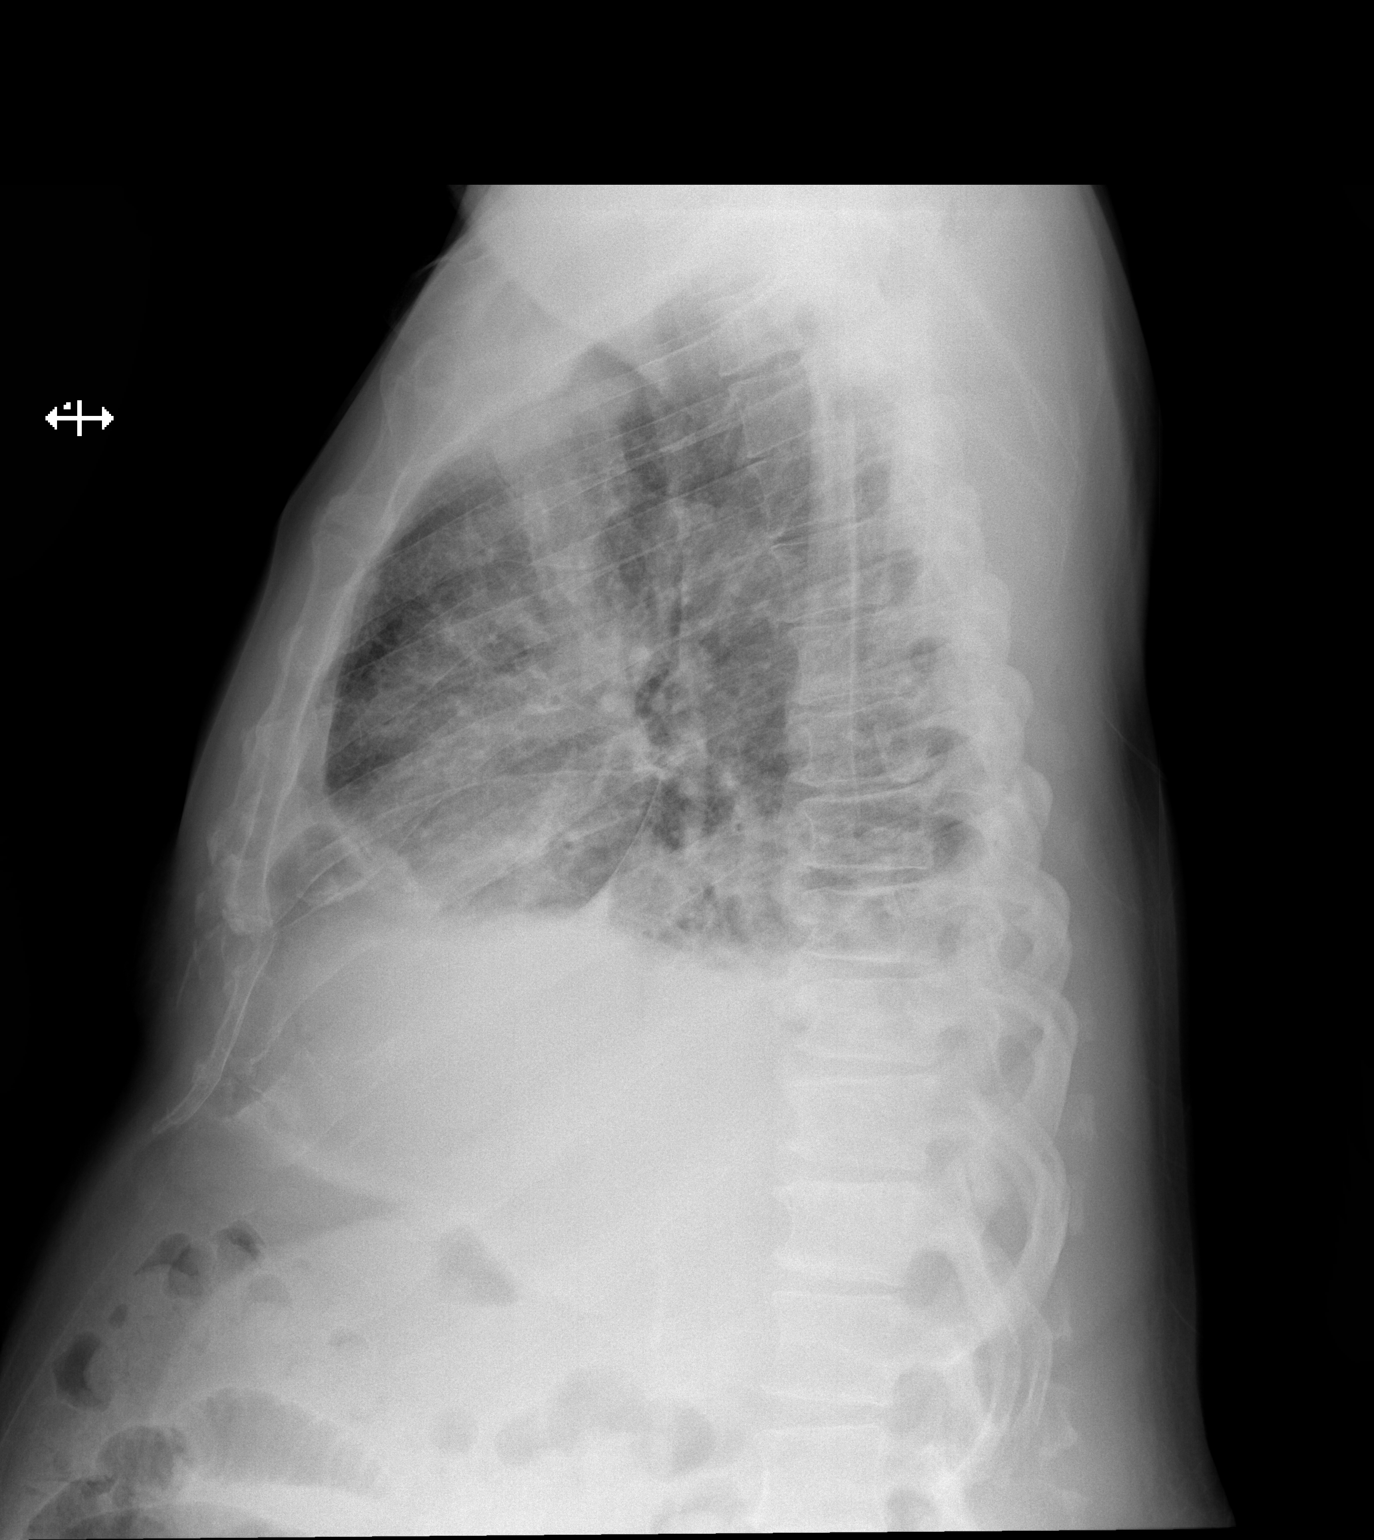

[2 of 2 positions shown; findings below may reference images not displayed]

FINDINGS: There are persistent densities at both lung bases. Findings are
compatible with bilateral small pleural effusions. Slightly
increased densities at the left lung base may represent atelectasis.
Negative for pneumothorax. Heart is obscured by the basilar chest
densities. Again noted is a surgical plate in the cervical spine. No
evidence for pulmonary edema.
IMPRESSION: Persistent small bilateral pleural effusions with atelectasis.
Minimal change from 12/10/2015.

## 2018-03-18 IMAGING — CR DG CHEST 1V PORT
1 series · 1 of 1 positions shown · non-contrast
Comparison: January 30, 2016

CLINICAL DATA: Hypoxia

EXAM:
PORTABLE CHEST 1 VIEW

[portable]
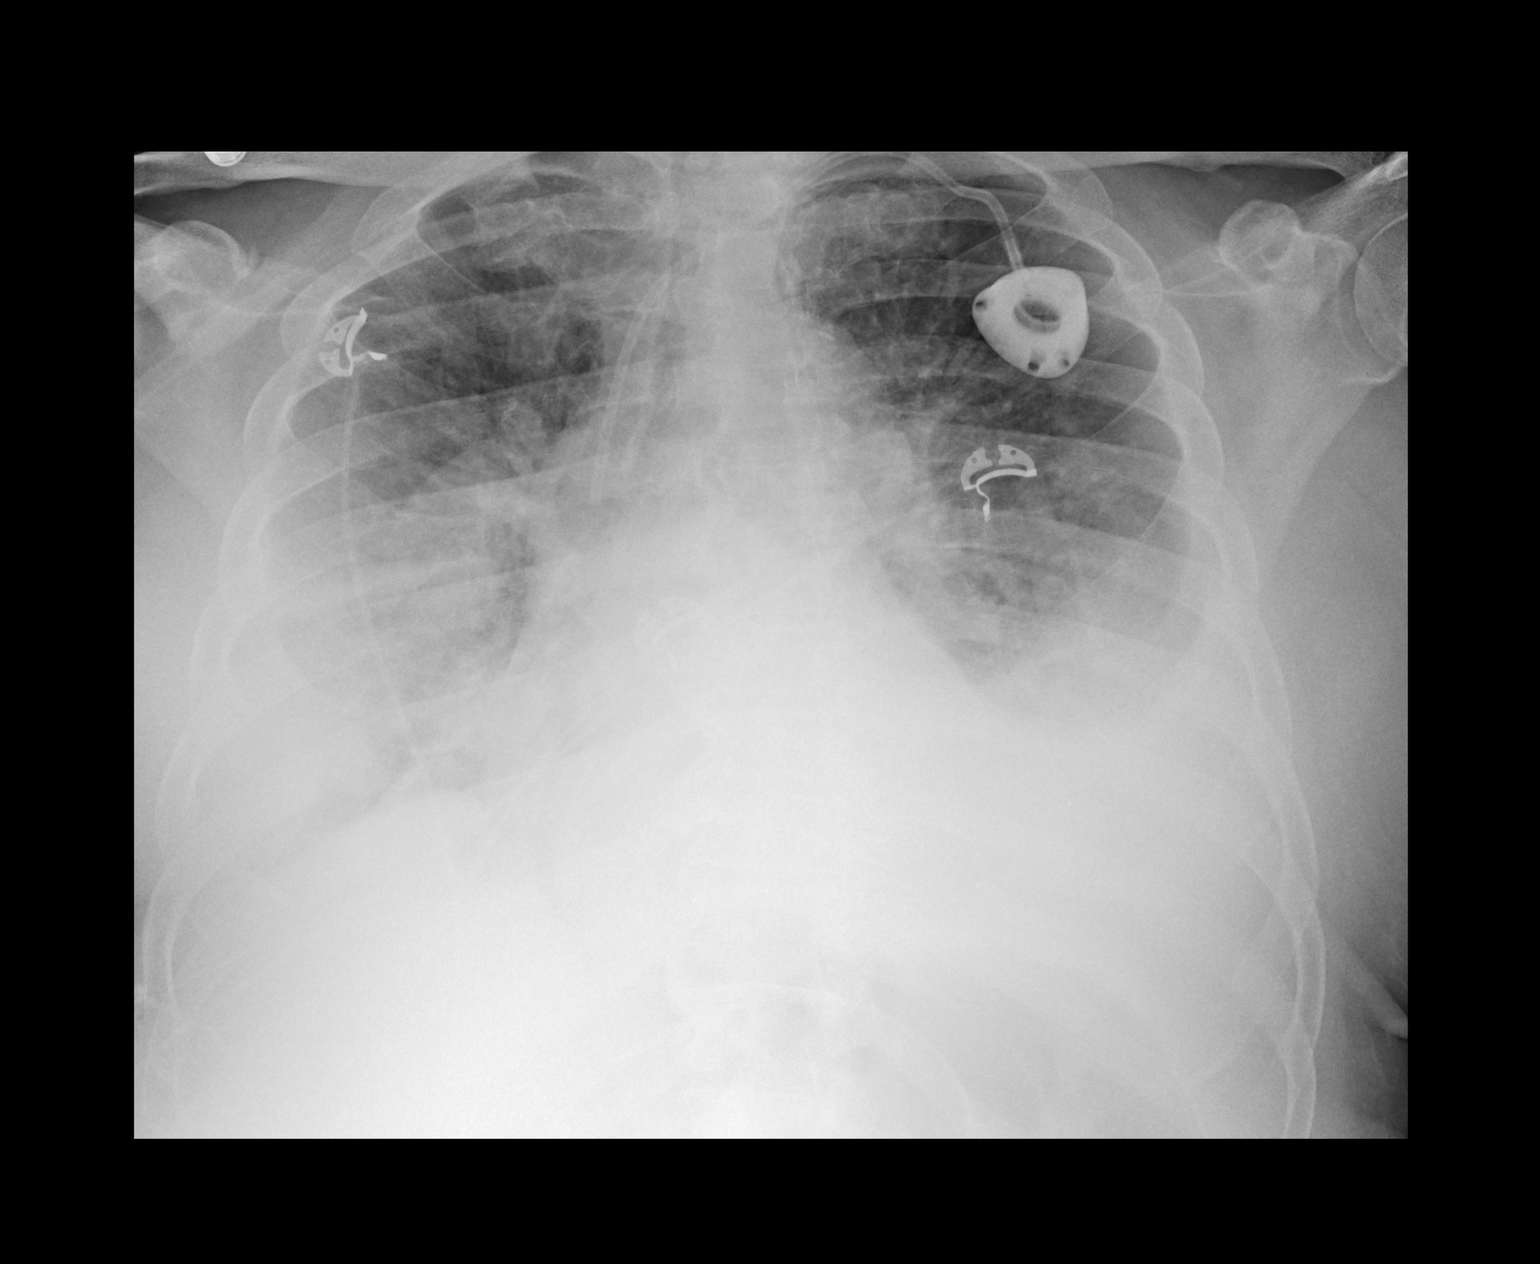

[1 of 1 positions shown; findings below may reference images not displayed]

FINDINGS: There is airspace consolidation throughout portions of the mid and
lower lung zones bilaterally with evidence of left pleural effusion.
Heart is mildly enlarged with pulmonary vascularity within normal
limits. Port-A-Cath tip is in the superior vena cava. No
pneumothorax. No bone lesions. There is atherosclerotic
calcification in the aorta.
IMPRESSION: Suspect multifocal pneumonia. There is increased opacity throughout
the right mid and lower lung zone compared to most recent study with
persistent consolidation in left effusion on the left without
change. Stable cardiac silhouette. There is aortic atherosclerosis.

## 2018-04-05 IMAGING — US US THORACENTESIS ASP PLEURAL SPACE W/IMG GUIDE
1 series · 1 of 1 positions shown · non-contrast
Comparison: none

INDICATION: LEFT pleural effusion, shortness of breath, history of colon cancer,
prostate cancer, RIGHT lung cancer, diabetes mellitus, coronary
artery disease post MI and PTCA

[Series 1: us thoracentesis asp pleural space w/img guide · 0.25mm/px · 1 of 1 slices shown]
[im 1/1]
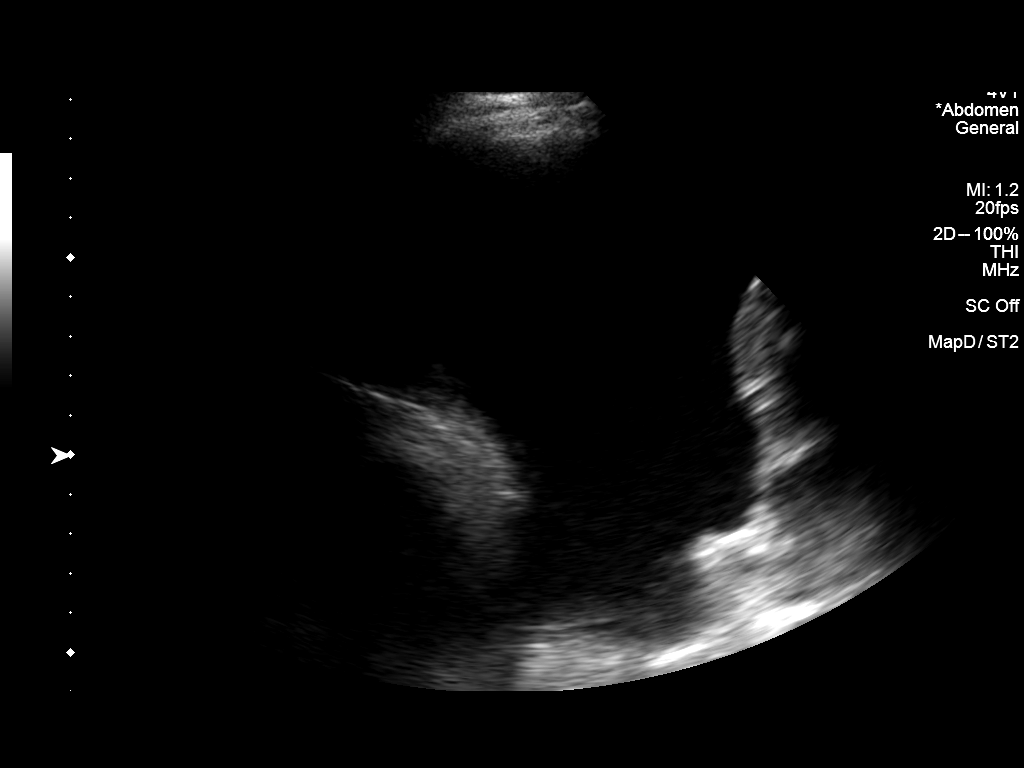

[1 of 1 positions shown; findings below may reference images not displayed]

EXAM:
ULTRASOUND GUIDED DIAGNOSTIC AND THERAPEUTIC LEFT THORACENTESIS

MEDICATIONS:
None.

COMPLICATIONS:
None immediate.

PROCEDURE:
Procedure, benefits, and risks of procedure were discussed with
patient.

Written informed consent for procedure was obtained.

Time out protocol followed.

Pleural effusion localized by ultrasound at the posterior LEFT
hemithorax.

Skin prepped and draped in usual sterile fashion.

Skin and soft tissues anesthetized with 10 mL of 1% lidocaine.

8 French thoracentesis catheter placed into the LEFT pleural space.

1.52 L of cloudy red fluid aspirated by syringe pump.

Procedure tolerated well by patient without immediate complication.
FINDINGS: A total of approximately 1.52 L of LEFT pleural fluid was removed.

Samples were sent to the laboratory as requested by the clinical
team.
IMPRESSION: Successful ultrasound guided LEFT thoracentesis yielding 1.52 L of
pleural fluid.

## 2018-04-05 IMAGING — CR DG CHEST 1V PORT
1 series · 1 of 1 positions shown · non-contrast
Comparison: Portable exam 6962 hours compared to 3303 hours

CLINICAL DATA: LEFT pleural effusion post thoracentesis, history
colon cancer, prostate cancer, RIGHT lung cancer

EXAM:
PORTABLE CHEST 1 VIEW

[ap portable]
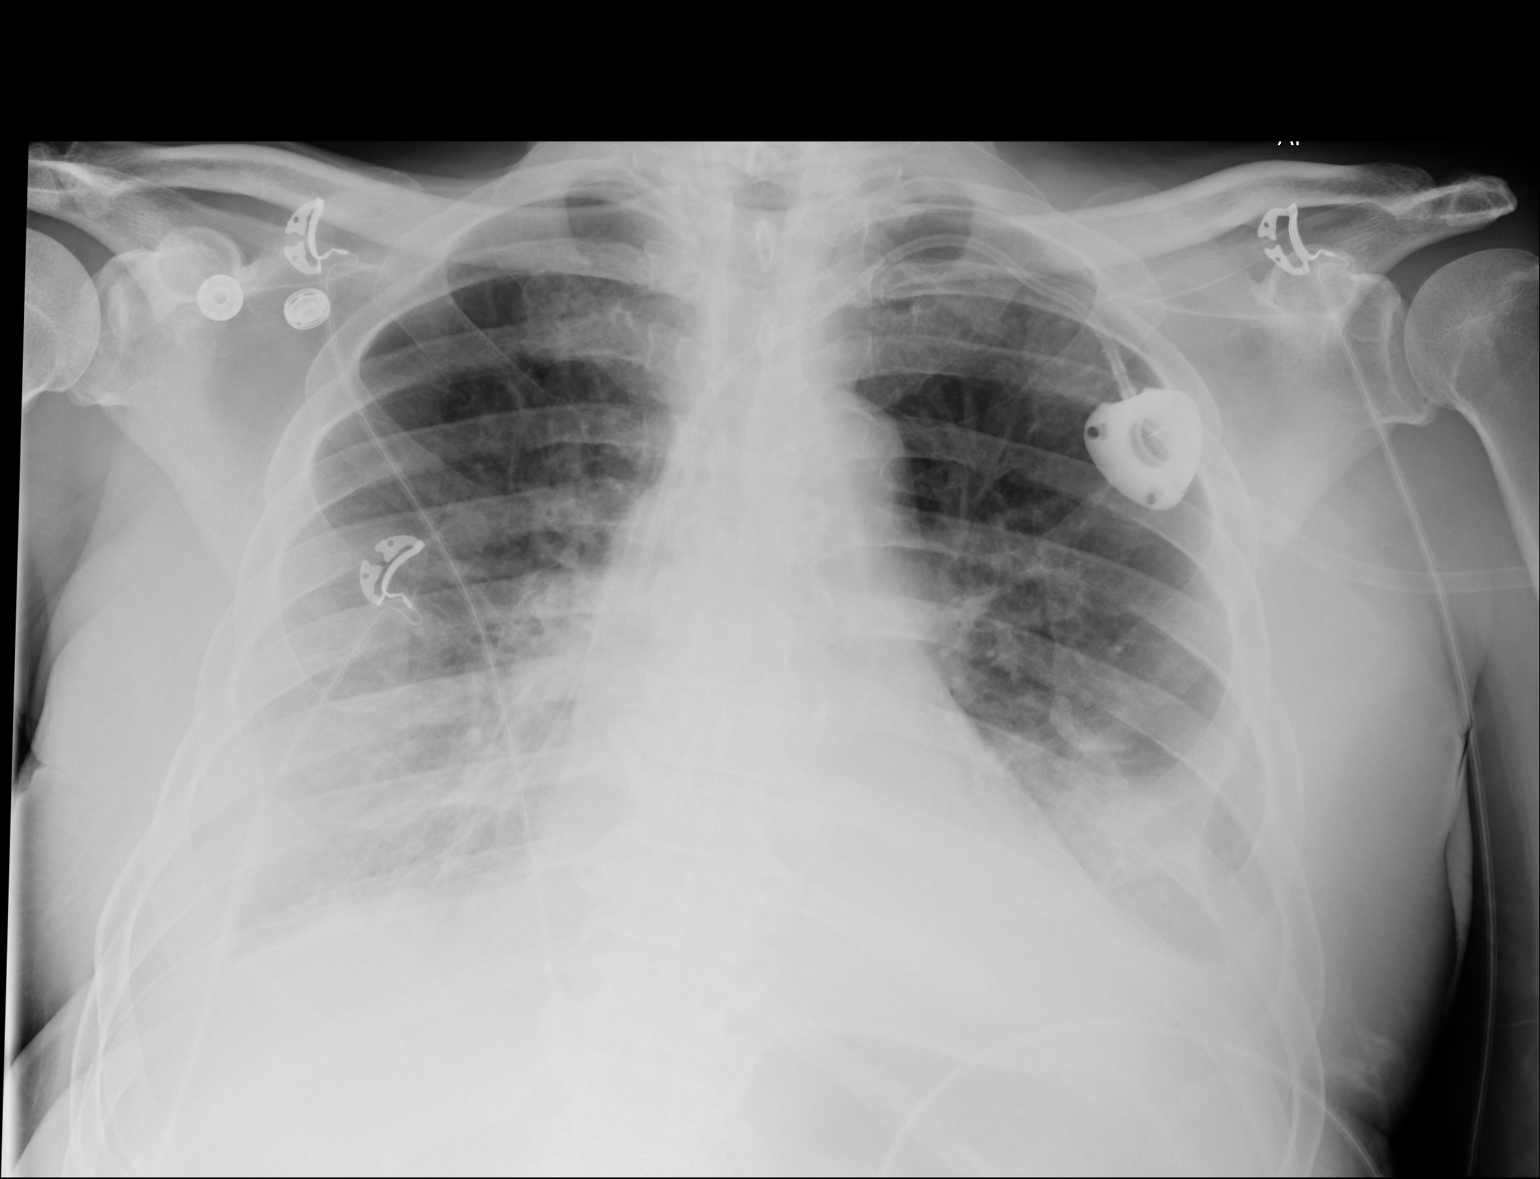

[1 of 1 positions shown; findings below may reference images not displayed]

FINDINGS: Bibasilar effusions, decreased on LEFT since previous exam.

No pneumothorax following thoracentesis.

Stable heart size and mediastinal contours.

LEFT subclavian Port-A-Cath unchanged.
IMPRESSION: No pneumothorax following LEFT thoracentesis.
